# Patient Record
Sex: Female | Born: 1940
Health system: Southern US, Community
[De-identification: ages and names within clinical notes are randomized; demographics above are authoritative.]

## PROBLEM LIST (undated history)

## (undated) DIAGNOSIS — R0789 Other chest pain: Secondary | ICD-10-CM

## (undated) DIAGNOSIS — M1990 Unspecified osteoarthritis, unspecified site: Secondary | ICD-10-CM

## (undated) DIAGNOSIS — K635 Polyp of colon: Secondary | ICD-10-CM

## (undated) DIAGNOSIS — I209 Angina pectoris, unspecified: Secondary | ICD-10-CM

## (undated) DIAGNOSIS — E785 Hyperlipidemia, unspecified: Secondary | ICD-10-CM

## (undated) DIAGNOSIS — R0600 Dyspnea, unspecified: Secondary | ICD-10-CM

## (undated) DIAGNOSIS — F419 Anxiety disorder, unspecified: Secondary | ICD-10-CM

## (undated) DIAGNOSIS — K649 Unspecified hemorrhoids: Secondary | ICD-10-CM

## (undated) DIAGNOSIS — M5126 Other intervertebral disc displacement, lumbar region: Secondary | ICD-10-CM

## (undated) DIAGNOSIS — N841 Polyp of cervix uteri: Secondary | ICD-10-CM

## (undated) DIAGNOSIS — K2970 Gastritis, unspecified, without bleeding: Secondary | ICD-10-CM

## (undated) DIAGNOSIS — F329 Major depressive disorder, single episode, unspecified: Secondary | ICD-10-CM

## (undated) DIAGNOSIS — R1310 Dysphagia, unspecified: Secondary | ICD-10-CM

## (undated) DIAGNOSIS — I10 Essential (primary) hypertension: Secondary | ICD-10-CM

## (undated) DIAGNOSIS — F32A Depression, unspecified: Secondary | ICD-10-CM

## (undated) DIAGNOSIS — G4700 Insomnia, unspecified: Secondary | ICD-10-CM

## (undated) DIAGNOSIS — I2510 Atherosclerotic heart disease of native coronary artery without angina pectoris: Secondary | ICD-10-CM

## (undated) DIAGNOSIS — E538 Deficiency of other specified B group vitamins: Secondary | ICD-10-CM

## (undated) DIAGNOSIS — K219 Gastro-esophageal reflux disease without esophagitis: Secondary | ICD-10-CM

## (undated) DIAGNOSIS — K224 Dyskinesia of esophagus: Secondary | ICD-10-CM

## (undated) DIAGNOSIS — I639 Cerebral infarction, unspecified: Secondary | ICD-10-CM

## (undated) DIAGNOSIS — F0390 Unspecified dementia without behavioral disturbance: Secondary | ICD-10-CM

## (undated) DIAGNOSIS — D329 Benign neoplasm of meninges, unspecified: Secondary | ICD-10-CM

## (undated) HISTORY — PX: OTHER SURGICAL HISTORY: SHX169

## (undated) HISTORY — PX: TUBAL LIGATION: SHX77

## (undated) HISTORY — PX: COLONOSCOPY: SHX174

## (undated) HISTORY — PX: CHOLECYSTECTOMY: SHX55

## (undated) HISTORY — PX: BIOPSY SHOULDER: PRO31

---

## 2005-04-11 ENCOUNTER — Ambulatory Visit: Payer: Self-pay | Admitting: Internal Medicine

## 2005-10-05 ENCOUNTER — Emergency Department: Payer: Self-pay | Admitting: Emergency Medicine

## 2005-10-07 ENCOUNTER — Ambulatory Visit: Payer: Self-pay | Admitting: Emergency Medicine

## 2005-11-05 ENCOUNTER — Ambulatory Visit: Payer: Self-pay | Admitting: Surgery

## 2006-04-15 ENCOUNTER — Ambulatory Visit: Payer: Self-pay | Admitting: Internal Medicine

## 2007-01-30 ENCOUNTER — Ambulatory Visit: Payer: Self-pay | Admitting: Unknown Physician Specialty

## 2007-02-19 ENCOUNTER — Ambulatory Visit: Payer: Self-pay | Admitting: Cardiology

## 2007-04-17 ENCOUNTER — Ambulatory Visit: Payer: Self-pay | Admitting: Internal Medicine

## 2007-09-08 ENCOUNTER — Ambulatory Visit: Payer: Self-pay | Admitting: Internal Medicine

## 2008-04-19 ENCOUNTER — Ambulatory Visit: Payer: Self-pay | Admitting: Internal Medicine

## 2008-07-11 ENCOUNTER — Emergency Department: Payer: Self-pay | Admitting: Emergency Medicine

## 2009-03-18 ENCOUNTER — Emergency Department: Payer: Self-pay | Admitting: Emergency Medicine

## 2009-03-26 ENCOUNTER — Emergency Department: Payer: Self-pay | Admitting: Internal Medicine

## 2009-04-12 ENCOUNTER — Ambulatory Visit: Payer: Self-pay | Admitting: Internal Medicine

## 2009-04-26 ENCOUNTER — Ambulatory Visit: Payer: Self-pay | Admitting: Internal Medicine

## 2010-04-27 ENCOUNTER — Ambulatory Visit: Payer: Self-pay | Admitting: Internal Medicine

## 2011-04-05 ENCOUNTER — Ambulatory Visit: Payer: Self-pay | Admitting: Internal Medicine

## 2011-05-15 ENCOUNTER — Ambulatory Visit: Payer: Self-pay | Admitting: Internal Medicine

## 2011-10-29 ENCOUNTER — Encounter: Payer: Self-pay | Admitting: Unknown Physician Specialty

## 2011-11-23 ENCOUNTER — Encounter: Payer: Self-pay | Admitting: Unknown Physician Specialty

## 2012-05-15 ENCOUNTER — Ambulatory Visit: Payer: Self-pay | Admitting: Internal Medicine

## 2012-10-29 ENCOUNTER — Ambulatory Visit: Payer: Self-pay | Admitting: Internal Medicine

## 2012-11-07 ENCOUNTER — Ambulatory Visit: Payer: Self-pay | Admitting: Unknown Physician Specialty

## 2012-11-10 LAB — PATHOLOGY REPORT

## 2013-05-28 ENCOUNTER — Other Ambulatory Visit: Payer: Self-pay | Admitting: Ophthalmology

## 2013-06-03 ENCOUNTER — Ambulatory Visit: Payer: Self-pay | Admitting: Internal Medicine

## 2013-06-09 ENCOUNTER — Ambulatory Visit: Payer: Self-pay | Admitting: Internal Medicine

## 2014-06-16 ENCOUNTER — Ambulatory Visit: Payer: Self-pay | Admitting: Internal Medicine

## 2014-11-17 ENCOUNTER — Ambulatory Visit: Payer: Self-pay | Admitting: Internal Medicine

## 2015-02-15 ENCOUNTER — Other Ambulatory Visit: Payer: Self-pay | Admitting: Internal Medicine

## 2015-02-15 DIAGNOSIS — Z1231 Encounter for screening mammogram for malignant neoplasm of breast: Secondary | ICD-10-CM

## 2015-03-02 ENCOUNTER — Ambulatory Visit: Payer: Self-pay | Attending: Internal Medicine

## 2015-11-19 ENCOUNTER — Emergency Department: Payer: Medicare Other

## 2015-11-19 ENCOUNTER — Emergency Department
Admission: EM | Admit: 2015-11-19 | Discharge: 2015-11-19 | Disposition: A | Discharge status: Home / Self Care | Payer: Medicare Other | Attending: Emergency Medicine | Admitting: Emergency Medicine

## 2015-11-19 DIAGNOSIS — S3992XA Unspecified injury of lower back, initial encounter: Secondary | ICD-10-CM | POA: Diagnosis not present

## 2015-11-19 DIAGNOSIS — W19XXXA Unspecified fall, initial encounter: Secondary | ICD-10-CM

## 2015-11-19 DIAGNOSIS — Y9289 Other specified places as the place of occurrence of the external cause: Secondary | ICD-10-CM | POA: Insufficient documentation

## 2015-11-19 DIAGNOSIS — W1839XA Other fall on same level, initial encounter: Secondary | ICD-10-CM | POA: Diagnosis not present

## 2015-11-19 DIAGNOSIS — Y9389 Activity, other specified: Secondary | ICD-10-CM | POA: Insufficient documentation

## 2015-11-19 DIAGNOSIS — Y998 Other external cause status: Secondary | ICD-10-CM | POA: Insufficient documentation

## 2015-11-19 DIAGNOSIS — M7918 Myalgia, other site: Secondary | ICD-10-CM

## 2015-11-19 LAB — URINALYSIS COMPLETE WITH MICROSCOPIC (ARMC ONLY)
BILIRUBIN URINE: NEGATIVE
GLUCOSE, UA: NEGATIVE mg/dL
Ketones, ur: NEGATIVE mg/dL
Nitrite: NEGATIVE
PH: 5 (ref 5.0–8.0)
Protein, ur: NEGATIVE mg/dL
Renal Epithelial: <1
Specific Gravity, Urine: 1.005 (ref 1.005–1.030)
Trans Epithel, UA: <1

## 2015-11-19 MED ORDER — OXYCODONE-ACETAMINOPHEN 5-325 MG PO TABS: 1.0000 | ORAL_TABLET | Freq: Four times a day (QID) | ORAL | Status: DC | PRN

## 2015-11-19 MED ORDER — ONDANSETRON 4 MG PO TBDP
4.0000 mg | ORAL_TABLET | Freq: Once | ORAL | Status: AC
  Administered 2015-11-19: 4 mg via ORAL
  Filled 2015-11-19: qty 1

## 2015-11-19 MED ORDER — ONDANSETRON 4 MG PO TBDP: 4.0000 mg | ORAL_TABLET | Freq: Three times a day (TID) | ORAL | Status: DC | PRN

## 2015-11-19 MED ORDER — OXYCODONE-ACETAMINOPHEN 5-325 MG PO TABS
1.0000 | ORAL_TABLET | Freq: Once | ORAL | Status: AC
  Administered 2015-11-19: 1 via ORAL
  Filled 2015-11-19: qty 1

## 2015-11-19 MED ORDER — TRAMADOL HCL 50 MG PO TABS
100.0000 mg | ORAL_TABLET | Freq: Once | ORAL | Status: AC
Start: 2015-11-19 — End: 2015-11-19
  Administered 2015-11-19: 100 mg via ORAL
  Filled 2015-11-19: qty 2

## 2015-11-19 NOTE — ED Notes (Signed)
Pt verbalized understanding of discharge instructions. NAD at this time. 

## 2015-11-19 NOTE — Discharge Instructions (Signed)
Please take your pain medication as needed, as prescribed. Please follow-up with orthopedics if her pain is not improved in the next several days. Return to the emergency department for any worsening pain, trouble walking, or any other symptom personally concerning to yourself.   Musculoskeletal Pain Musculoskeletal pain is muscle and boney aches and pains. These pains can occur in any part of the body. Your caregiver may treat you without knowing the cause of the pain. They may treat you if blood or urine tests, X-rays, and other tests were normal.  CAUSES There is often not a definite cause or reason for these pains. These pains may be caused by a type of germ (virus). The discomfort may also come from overuse. Overuse includes working out too hard when your body is not fit. Boney aches also come from weather changes. Bone is sensitive to atmospheric pressure changes. HOME CARE INSTRUCTIONS   Ask when your test results will be ready. Make sure you get your test results.  Only take over-the-counter or prescription medicines for pain, discomfort, or fever as directed by your caregiver. If you were given medications for your condition, do not drive, operate machinery or power tools, or sign legal documents for 24 hours. Do not drink alcohol. Do not take sleeping pills or other medications that may interfere with treatment.  Continue all activities unless the activities cause more pain. When the pain lessens, slowly resume normal activities. Gradually increase the intensity and duration of the activities or exercise.  During periods of severe pain, bed rest may be helpful. Lay or sit in any position that is comfortable.  Putting ice on the injured area.  Put ice in a bag.  Place a towel between your skin and the bag.  Leave the ice on for 15 to 20 minutes, 3 to 4 times a day.  Follow up with your caregiver for continued problems and no reason can be found for the pain. If the pain becomes  worse or does not go away, it may be necessary to repeat tests or do additional testing. Your caregiver may need to look further for a possible cause. SEEK IMMEDIATE MEDICAL CARE IF:  You have pain that is getting worse and is not relieved by medications.  You develop chest pain that is associated with shortness or breath, sweating, feeling sick to your stomach (nauseous), or throw up (vomit).  Your pain becomes localized to the abdomen.  You develop any new symptoms that seem different or that concern you. MAKE SURE YOU:   Understand these instructions.  Will watch your condition.  Will get help right away if you are not doing well or get worse.   This information is not intended to replace advice given to you by your health care provider. Make sure you discuss any questions you have with your health care provider.   Document Released: 10/08/2005 Document Revised: 12/31/2011 Document Reviewed: 06/12/2013 Elsevier Interactive Patient Education [employer].

## 2015-11-19 NOTE — ED Notes (Signed)
Ems states pt fell "a few days ago". Pt states awoke several hours ago with coccyx pain. Pt moving all extremities.

## 2015-11-19 NOTE — ED Provider Notes (Signed)
[facility] Emergency Department Provider Note  Time seen: 7:27 AM  I have reviewed the triage vital signs and the nursing notes.   HISTORY  Chief Complaint Fall    HPI Mallory Peters is a 75 y.o. female with a past medical history of low back pain, depression, presents to the emergency department with low back pain. According to the patient she fell Thursday evening (36 hours ago). She states minimal pain at that time, she was doing well yesterday, however upon awakening this morning she states significant lower back and buttock pain. Hurts worse with any attempted ambulation. Patient states she tried stretching to see if it would feel better but it did not so she came to the emergency department for evaluation. States she tried to go to urgent care yesterday however she states she waited around for about an hour and then left without being seen. Describes her pain as mild to moderate when lying flat, but moderate to severe when attempting to move. Patient has been able to ambulate without difficulty per patient. Denies any weakness or numbness of either leg. Denies any dysuria. Denies any fever.     No past medical history on file.  There are no active problems to display for this patient.   No past surgical history on file.  No current outpatient prescriptions on file.  Allergies Review of patient's allergies indicates no known allergies.  No family history on file.  Social History Social History  Substance Use Topics  . Smoking status: Not on file  . Smokeless tobacco: Not on file  . Alcohol Use: Not on file    Review of Systems Constitutional: Negative for fever. Cardiovascular: Negative for chest pain. Respiratory: Negative for shortness of breath. Gastrointestinal: Negative for abdominal pain Neurological: Negative for headache 10-point ROS otherwise negative.  ____________________________________________   PHYSICAL  EXAM:  VITAL SIGNS: ED Triage Vitals  Enc Vitals Group     BP 11/19/15 0704 153/97 mmHg     Pulse Rate 11/19/15 0704 85     Resp --      Temp 11/19/15 0704 97.7 F (36.5 C)     Temp Source 11/19/15 0704 Oral     SpO2 11/19/15 0704 96 %     Weight 11/19/15 0704 130 lb (58.968 kg)     Height 11/19/15 0704 5\' 2"  (1.575 m)     Head Cir --      Peak Flow --      Pain Score 11/19/15 0705 10     Pain Loc --      Pain Edu? --      Excl. in [place]? --     Constitutional: Alert and oriented. Well appearing and in no distress. Eyes: Normal exam ENT   Head: Normocephalic and atraumatic.   Mouth/Throat: Mucous membranes are moist. Cardiovascular: Normal rate, regular rhythm. No murmur Respiratory: Normal respiratory effort without tachypnea nor retractions. Breath sounds are clear and equal bilaterally. No wheezes/rales/rhonchi. Gastrointestinal: Soft and nontender. No distention.   Musculoskeletal: Moderate right sided lumbar paraspinal tenderness to palpation. Pelvis is stable. Good range of motion in the hips however she does have mild lower back pain with range of motion in the hips. Neurovascularly intact distally. Neurologic:  Normal speech and language. No gross focal neurologic deficits  Skin:  Skin is warm, dry and intact.  Psychiatric: Mood and affect are normal. Speech and behavior are normal.  ____________________________________________    RADIOLOGY  X-rays are negative  ____________________________________________  INITIAL IMPRESSION / ASSESSMENT AND PLAN / ED COURSE  Pertinent labs & imaging results that were available during my care of the patient were reviewed by me and considered in my medical decision making (see chart for details).  Patient presents the emergency department after a fall 36 hours ago with lower back and pelvis pain. Patient was able to walk around all day yesterday without any issues. Today she states pain with ambulation but she is still  able to ambulate well. We will treat the patient's discomfort with Ultram, obtain x-rays of her lumbar spine and pelvis, as well as a urinalysis. Overall the patient appears very well.  X-rays are negative for acute fracture. Urinalysis normal. Patient is feeling better status post pain medication. She did need Zofran due to nausea. We'll discharge the patient home, she has oxycodone at home which she has not been using because of the nausea. We will also prescribe Zofran ODT for the patient. ____________________________________________   FINAL CLINICAL IMPRESSION(S) / ED DIAGNOSES  Fall Musculoskeletal pain   Harvest Dark, MD 11/19/15 [phone]

## 2015-12-05 ENCOUNTER — Other Ambulatory Visit: Payer: Self-pay | Admitting: Unknown Physician Specialty

## 2015-12-05 DIAGNOSIS — T17908A Unspecified foreign body in respiratory tract, part unspecified causing other injury, initial encounter: Secondary | ICD-10-CM

## 2015-12-05 DIAGNOSIS — R1310 Dysphagia, unspecified: Secondary | ICD-10-CM

## 2016-01-30 ENCOUNTER — Other Ambulatory Visit: Payer: Self-pay | Admitting: Neurology

## 2016-01-30 DIAGNOSIS — R29898 Other symptoms and signs involving the musculoskeletal system: Secondary | ICD-10-CM

## 2016-02-20 ENCOUNTER — Ambulatory Visit
Admission: RE | Admit: 2016-02-20 | Discharge: 2016-02-20 | Disposition: A | Discharge status: Home / Self Care | Payer: Medicare Other | Source: Ambulatory Visit | Attending: Neurology | Admitting: Neurology

## 2016-02-20 ENCOUNTER — Ambulatory Visit: Payer: Medicare Other

## 2016-02-20 DIAGNOSIS — G319 Degenerative disease of nervous system, unspecified: Secondary | ICD-10-CM | POA: Diagnosis not present

## 2016-02-20 DIAGNOSIS — R29898 Other symptoms and signs involving the musculoskeletal system: Secondary | ICD-10-CM | POA: Insufficient documentation

## 2016-02-20 DIAGNOSIS — R29810 Facial weakness: Secondary | ICD-10-CM | POA: Insufficient documentation

## 2016-02-20 DIAGNOSIS — I739 Peripheral vascular disease, unspecified: Secondary | ICD-10-CM | POA: Insufficient documentation

## 2016-02-20 DIAGNOSIS — M479 Spondylosis, unspecified: Secondary | ICD-10-CM | POA: Insufficient documentation

## 2016-02-20 DIAGNOSIS — D320 Benign neoplasm of cerebral meninges: Secondary | ICD-10-CM | POA: Diagnosis not present

## 2016-02-20 MED ORDER — GADOBENATE DIMEGLUMINE 529 MG/ML IV SOLN
15.0000 mL | Freq: Once | INTRAVENOUS | Status: AC | PRN
  Administered 2016-02-20: 12 mL via INTRAVENOUS

## 2016-05-17 ENCOUNTER — Other Ambulatory Visit: Payer: Self-pay | Admitting: Internal Medicine

## 2016-05-17 DIAGNOSIS — R1310 Dysphagia, unspecified: Secondary | ICD-10-CM

## 2016-05-25 ENCOUNTER — Ambulatory Visit
Admission: RE | Admit: 2016-05-25 | Discharge: 2016-05-25 | Disposition: A | Discharge status: Home / Self Care | Payer: Medicare Other | Source: Ambulatory Visit | Attending: Internal Medicine | Admitting: Internal Medicine

## 2016-05-25 DIAGNOSIS — R05 Cough: Secondary | ICD-10-CM | POA: Diagnosis not present

## 2016-05-25 DIAGNOSIS — R1310 Dysphagia, unspecified: Secondary | ICD-10-CM | POA: Insufficient documentation

## 2016-05-25 DIAGNOSIS — K228 Other specified diseases of esophagus: Secondary | ICD-10-CM | POA: Insufficient documentation

## 2016-05-25 DIAGNOSIS — K449 Diaphragmatic hernia without obstruction or gangrene: Secondary | ICD-10-CM | POA: Diagnosis not present

## 2016-07-11 ENCOUNTER — Encounter: Payer: Self-pay | Admitting: *Deleted

## 2016-07-12 ENCOUNTER — Encounter: Payer: Self-pay | Admitting: Anesthesiology

## 2016-07-12 ENCOUNTER — Ambulatory Visit: Payer: Medicare Other | Admitting: Anesthesiology

## 2016-07-12 ENCOUNTER — Encounter
Admission: RE | Disposition: A | Discharge status: Home / Self Care | Payer: Self-pay | Source: Ambulatory Visit | Attending: Unknown Physician Specialty

## 2016-07-12 ENCOUNTER — Ambulatory Visit
Admission: RE | Admit: 2016-07-12 | Discharge: 2016-07-12 | Disposition: A | Discharge status: Home / Self Care | Payer: Medicare Other | Source: Ambulatory Visit | Attending: Unknown Physician Specialty | Admitting: Unknown Physician Specialty

## 2016-07-12 DIAGNOSIS — Z79899 Other long term (current) drug therapy: Secondary | ICD-10-CM | POA: Diagnosis not present

## 2016-07-12 DIAGNOSIS — I2510 Atherosclerotic heart disease of native coronary artery without angina pectoris: Secondary | ICD-10-CM | POA: Diagnosis not present

## 2016-07-12 DIAGNOSIS — F329 Major depressive disorder, single episode, unspecified: Secondary | ICD-10-CM | POA: Diagnosis not present

## 2016-07-12 DIAGNOSIS — M1990 Unspecified osteoarthritis, unspecified site: Secondary | ICD-10-CM | POA: Diagnosis not present

## 2016-07-12 DIAGNOSIS — K228 Other specified diseases of esophagus: Secondary | ICD-10-CM | POA: Insufficient documentation

## 2016-07-12 DIAGNOSIS — R1310 Dysphagia, unspecified: Secondary | ICD-10-CM | POA: Diagnosis present

## 2016-07-12 DIAGNOSIS — K219 Gastro-esophageal reflux disease without esophagitis: Secondary | ICD-10-CM | POA: Diagnosis not present

## 2016-07-12 DIAGNOSIS — I10 Essential (primary) hypertension: Secondary | ICD-10-CM | POA: Insufficient documentation

## 2016-07-12 DIAGNOSIS — Z7982 Long term (current) use of aspirin: Secondary | ICD-10-CM | POA: Diagnosis not present

## 2016-07-12 DIAGNOSIS — F419 Anxiety disorder, unspecified: Secondary | ICD-10-CM | POA: Diagnosis not present

## 2016-07-12 DIAGNOSIS — Z7902 Long term (current) use of antithrombotics/antiplatelets: Secondary | ICD-10-CM | POA: Diagnosis not present

## 2016-07-12 DIAGNOSIS — Z8673 Personal history of transient ischemic attack (TIA), and cerebral infarction without residual deficits: Secondary | ICD-10-CM | POA: Insufficient documentation

## 2016-07-12 HISTORY — PX: SAVORY DILATION: SHX5439

## 2016-07-12 HISTORY — DX: Dysphagia, unspecified: R13.10

## 2016-07-12 HISTORY — DX: Depression, unspecified: F32.A

## 2016-07-12 HISTORY — DX: Benign neoplasm of meninges, unspecified: D32.9

## 2016-07-12 HISTORY — DX: Gastro-esophageal reflux disease without esophagitis: K21.9

## 2016-07-12 HISTORY — DX: Polyp of cervix uteri: N84.1

## 2016-07-12 HISTORY — DX: Other chest pain: R07.89

## 2016-07-12 HISTORY — DX: Essential (primary) hypertension: I10

## 2016-07-12 HISTORY — DX: Anxiety disorder, unspecified: F41.9

## 2016-07-12 HISTORY — DX: Major depressive disorder, single episode, unspecified: F32.9

## 2016-07-12 HISTORY — DX: Unspecified osteoarthritis, unspecified site: M19.90

## 2016-07-12 HISTORY — DX: Cerebral infarction, unspecified: I63.9

## 2016-07-12 HISTORY — DX: Hyperlipidemia, unspecified: E78.5

## 2016-07-12 HISTORY — DX: Insomnia, unspecified: G47.00

## 2016-07-12 HISTORY — DX: Atherosclerotic heart disease of native coronary artery without angina pectoris: I25.10

## 2016-07-12 HISTORY — PX: ESOPHAGOGASTRODUODENOSCOPY (EGD) WITH PROPOFOL: SHX5813

## 2016-07-12 SURGERY — ESOPHAGOGASTRODUODENOSCOPY (EGD) WITH PROPOFOL
Anesthesia: General

## 2016-07-12 MED ORDER — PROPOFOL 500 MG/50ML IV EMUL
INTRAVENOUS | Status: DC | PRN
  Administered 2016-07-12: 100 ug/kg/min via INTRAVENOUS

## 2016-07-12 MED ORDER — MIDAZOLAM HCL 2 MG/2ML IJ SOLN
INTRAMUSCULAR | Status: DC | PRN
  Administered 2016-07-12: 1 mg via INTRAVENOUS

## 2016-07-12 MED ORDER — FENTANYL CITRATE (PF) 100 MCG/2ML IJ SOLN
INTRAMUSCULAR | Status: DC | PRN
  Administered 2016-07-12: 50 ug via INTRAVENOUS

## 2016-07-12 MED ORDER — SODIUM CHLORIDE 0.9 % IV SOLN
INTRAVENOUS | Status: DC
  Administered 2016-07-12: 09:00:00 via INTRAVENOUS

## 2016-07-12 MED ORDER — SODIUM CHLORIDE 0.9 % IV SOLN: INTRAVENOUS | Status: DC

## 2016-07-12 MED ORDER — LIDOCAINE HCL (CARDIAC) 20 MG/ML IV SOLN
INTRAVENOUS | Status: DC | PRN
  Administered 2016-07-12: 30 mg via INTRAVENOUS

## 2016-07-12 MED ORDER — SODIUM CHLORIDE 0.9 % IV SOLN
INTRAVENOUS | Status: DC | PRN
  Administered 2016-07-12: 09:00:00 via INTRAVENOUS

## 2016-07-12 NOTE — Anesthesia Postprocedure Evaluation (Signed)
Anesthesia Post Note  Patient: Mallory Peters  Procedure(s) Performed: Procedure(s) (LRB): ESOPHAGOGASTRODUODENOSCOPY (EGD) WITH PROPOFOL (N/A) SAVORY DILATION (N/A)  Patient location during evaluation: PACU Anesthesia Type: General Level of consciousness: awake Pain management: pain level controlled Vital Signs Assessment: post-procedure vital signs reviewed and stable Respiratory status: spontaneous breathing Cardiovascular status: stable Anesthetic complications: no    Last Vitals:  Vitals:   07/12/16 0931 07/12/16 0938  BP: 92/77 (!) 162/89  Pulse:  90  Resp:  14  Temp:      Last Pain:  Vitals:   07/12/16 0926  TempSrc: Tympanic  PainSc:                  VAN STAVEREN,Chieko Neises

## 2016-07-12 NOTE — H&P (Signed)
Primary Care Physician:  Idelle Crouch, MD Primary Gastroenterologist:  Dr. Vira Agar  Pre-Procedure History & Physical: HPI:  Mallory Peters is a 75 y.o. female is here for an endoscopy.   Past Medical History:  Diagnosis Date  . Anxiety   . CVD (cardiovascular disease)   . Depression   . Dysphagia   . GERD (gastroesophageal reflux disease)   . Hypertension   . Insomnia   . Lipids blood increased   . Meningioma ([place])   . Non-cardiac chest pain   . Osteoarthritis   . Polyp of cervix   . Stroke (cerebrum) [facility])     Past Surgical History:  Procedure Laterality Date  . BIOPSY SHOULDER    . CHOLECYSTECTOMY    . colonic polyps    . TUBAL LIGATION      Prior to Admission medications   Medication Sig Start Date End Date Taking? Authorizing Provider  ALPRAZolam (XANAX) 0.25 MG tablet Take 0.25 mg by mouth every 6 (six) hours as needed for anxiety.   Yes Historical Provider, MD  aspirin EC 81 MG tablet Take 81 mg by mouth daily.   Yes Historical Provider, MD  atorvastatin (LIPITOR) 20 MG tablet Take 20 mg by mouth daily.   Yes Historical Provider, MD  Cholecalciferol (VITAMIN D3) 1000 units CAPS Take 1,000 Units by mouth daily.   Yes Historical Provider, MD  clopidogrel (PLAVIX) 75 MG tablet Take 75 mg by mouth daily.   Yes Historical Provider, MD  diphenhydrAMINE (BENADRYL) 25 MG tablet Take 25 mg by mouth 1 day or 1 dose.   Yes Historical Provider, MD  pseudoephedrine-acetaminophen (TYLENOL SINUS) 30-500 MG TABS tablet Take 1 tablet by mouth every 4 (four) hours as needed.   Yes Historical Provider, MD  sertraline (ZOLOFT) 100 MG tablet Take 100 mg by mouth daily.   Yes Historical Provider, MD  vitamin B-12 (CYANOCOBALAMIN) 1000 MCG tablet Take 1,000 mcg by mouth daily.   Yes Historical Provider, MD  ondansetron (ZOFRAN ODT) 4 MG disintegrating tablet Take 1 tablet (4 mg total) by mouth every 8 (eight) hours as needed for nausea or vomiting. 11/19/15   Harvest Dark,  MD  oxyCODONE-acetaminophen (ROXICET) 5-325 MG tablet Take 1 tablet by mouth every 6 (six) hours as needed. Patient not taking: Reported on 07/12/2016 11/19/15   Harvest Dark, MD  pantoprazole (PROTONIX) 40 MG tablet Take 40 mg by mouth daily.    Historical Provider, MD    Allergies as of 06/28/2016  . (No Known Allergies)    History reviewed. No pertinent family history.  Social History   Social History  . Marital status: Widowed    Spouse name: N/A  . Number of children: N/A  . Years of education: N/A   Occupational History  . Not on file.   Social History Main Topics  . Smoking status: Never Smoker  . Smokeless tobacco: Never Used  . Alcohol use No  . Drug use: No  . Sexual activity: Not on file   Other Topics Concern  . Not on file   Social History Narrative  . No narrative on file    Review of Systems: See HPI, otherwise negative ROS  Physical Exam: BP 108/61   Pulse 82   Temp 97.7 F (36.5 C) (Tympanic)   Resp 14   Ht 5\' 2"  (1.575 m)   Wt 60.3 kg (133 lb)   SpO2 97%   BMI 24.33 kg/m  General:   Alert,  pleasant and cooperative  in NAD Head:  Normocephalic and atraumatic. Neck:  Supple; no masses or thyromegaly. Lungs:  Clear throughout to auscultation.    Heart:  Regular rate and rhythm. Abdomen:  Soft, nontender and nondistended. Normal bowel sounds, without guarding, and without rebound.   Neurologic:  Alert and  oriented x4;  grossly normal neurologically.  Impression/Plan: Mallory Peters is here for an endoscopy to be performed for dysphagia  Risks, benefits, limitations, and alternatives regarding  endoscopy have been reviewed with the patient.  Questions have been answered.  All parties agreeable.   Gaylyn Cheers, MD  07/12/2016, 9:10 AM

## 2016-07-12 NOTE — Transfer of Care (Signed)
Immediate Anesthesia Transfer of Care Note  Patient: Mallory Peters  Procedure(s) Performed: Procedure(s): ESOPHAGOGASTRODUODENOSCOPY (EGD) WITH PROPOFOL (N/A) SAVORY DILATION (N/A)  Patient Location: PACU  Anesthesia Type:General  Level of Consciousness: awake and sedated  Airway & Oxygen Therapy: Patient Spontanous Breathing and Patient connected to nasal cannula oxygen  Post-op Assessment: Report given to RN and Post -op Vital signs reviewed and stable  Post vital signs: Reviewed and stable  Last Vitals:  Vitals:   07/12/16 0843  BP: 108/61  Pulse: 82  Resp: 14  Temp: 36.5 C    Last Pain:  Vitals:   07/12/16 0843  TempSrc: Tympanic  PainSc: 3          Complications: No apparent anesthesia complications

## 2016-07-12 NOTE — Op Note (Signed)
[facility] Patient Name: Mallory Peters Procedure Date: 07/12/2016 8:52 AM MRN: [id number] Account #: [id number] Date of Birth: July 20, 1941 Admit Type: Outpatient Age: 75 Room: [facility] ENDO ROOM 1 Gender: Female Note Status: Finalized Procedure:            Upper GI endoscopy Indications:          Dysphagia Providers:            Manya Silvas, MD Referring MD:         Leonie Douglas. Doy Hutching, MD (Referring MD) Medicines:            Propofol per Anesthesia Complications:        No immediate complications. Procedure:            Pre-Anesthesia Assessment:                       - After reviewing the risks and benefits, the patient                        was deemed in satisfactory condition to undergo the                        procedure.                       After obtaining informed consent, the endoscope was                        passed under direct vision. Throughout the procedure,                        the patient's blood pressure, pulse, and oxygen                        saturations were monitored continuously. The Endoscope                        was introduced through the mouth, and advanced to the                        second part of duodenum. The upper GI endoscopy was                        accomplished without difficulty. The patient tolerated                        the procedure well. Findings:      laryngeal swollen purple oval prominence likely a venous plexus causing       swelling, this extends into upper esophagus. See picture.      The entire examined stomach was normal.      The examined duodenum was normal. Impression:           - Normal stomach.                       - Normal examined duodenum.                       - No specimens collected. Recommendation:       - The findings and recommendations were discussed with  the patient's family. Refer to ENT, recommend CT of                         neck. Manya Silvas, MD 07/12/2016 9:34:38 AM This report has been signed electronically. Number of Addenda: 0 Note Initiated On: 07/12/2016 8:52 AM      [facility]

## 2016-07-12 NOTE — Anesthesia Preprocedure Evaluation (Signed)
Anesthesia Evaluation  Patient identified by MRN, date of birth, ID band Patient awake    Reviewed: Allergy & Precautions, NPO status , Patient's Chart, lab work & pertinent test results  Airway Mallampati: II       Dental  (+) Teeth Intact   Pulmonary neg pulmonary ROS,    breath sounds clear to auscultation       Cardiovascular (-) hypertension Rhythm:Regular     Neuro/Psych Anxiety Depression CVA    GI/Hepatic Neg liver ROS, GERD  Medicated,  Endo/Other  negative endocrine ROS  Renal/GU negative Renal ROS     Musculoskeletal   Abdominal Normal abdominal exam  (+)   Peds negative pediatric ROS (+)  Hematology negative hematology ROS (+)   Anesthesia Other Findings   Reproductive/Obstetrics                             Anesthesia Physical Anesthesia Plan  ASA: II  Anesthesia Plan: General   Post-op Pain Management:    Induction: Intravenous  Airway Management Planned: Natural Airway and Nasal Cannula  Additional Equipment:   Intra-op Plan:   Post-operative Plan:   Informed Consent: I have reviewed the patients History and Physical, chart, labs and discussed the procedure including the risks, benefits and alternatives for the proposed anesthesia with the patient or authorized representative who has indicated his/her understanding and acceptance.     Plan Discussed with: CRNA  Anesthesia Plan Comments:         Anesthesia Quick Evaluation

## 2016-07-12 NOTE — Anesthesia Procedure Notes (Signed)
Performed by: COOK-MARTIN, Nyaira Hodgens Pre-anesthesia Checklist: Patient identified, Emergency Drugs available, Suction available, Patient being monitored and Timeout performed Patient Re-evaluated:Patient Re-evaluated prior to inductionOxygen Delivery Method: Nasal cannula Preoxygenation: Pre-oxygenation with 100% oxygen Intubation Type: IV induction Airway Equipment and Method: Bite block Placement Confirmation: CO2 detector and positive ETCO2     

## 2016-07-13 ENCOUNTER — Encounter: Payer: Self-pay | Admitting: Unknown Physician Specialty

## 2016-07-16 ENCOUNTER — Other Ambulatory Visit: Payer: Self-pay | Admitting: Unknown Physician Specialty

## 2016-07-16 DIAGNOSIS — J383 Other diseases of vocal cords: Secondary | ICD-10-CM

## 2016-07-17 ENCOUNTER — Ambulatory Visit
Admission: RE | Admit: 2016-07-17 | Discharge: 2016-07-17 | Disposition: A | Discharge status: Home / Self Care | Payer: Medicare Other | Source: Ambulatory Visit | Attending: Unknown Physician Specialty | Admitting: Unknown Physician Specialty

## 2016-07-17 ENCOUNTER — Other Ambulatory Visit: Payer: Self-pay | Admitting: Unknown Physician Specialty

## 2016-07-17 DIAGNOSIS — I998 Other disorder of circulatory system: Secondary | ICD-10-CM | POA: Insufficient documentation

## 2016-07-17 DIAGNOSIS — J383 Other diseases of vocal cords: Secondary | ICD-10-CM

## 2016-07-17 LAB — POCT I-STAT CREATININE: CREATININE: 0.7 mg/dL (ref 0.44–1.00)

## 2016-07-17 MED ORDER — GADOBENATE DIMEGLUMINE 529 MG/ML IV SOLN
15.0000 mL | Freq: Once | INTRAVENOUS | Status: AC | PRN
  Administered 2016-07-17: 12 mL via INTRAVENOUS

## 2016-07-23 ENCOUNTER — Other Ambulatory Visit: Payer: Self-pay | Admitting: Unknown Physician Specialty

## 2016-07-23 DIAGNOSIS — R490 Dysphonia: Secondary | ICD-10-CM

## 2016-07-23 DIAGNOSIS — Q2739 Arteriovenous malformation, other site: Secondary | ICD-10-CM

## 2016-07-27 ENCOUNTER — Ambulatory Visit
Admission: RE | Admit: 2016-07-27 | Discharge: 2016-07-27 | Disposition: A | Discharge status: Home / Self Care | Payer: Medicare Other | Source: Ambulatory Visit | Attending: Unknown Physician Specialty | Admitting: Unknown Physician Specialty

## 2016-07-27 DIAGNOSIS — R490 Dysphonia: Secondary | ICD-10-CM

## 2016-07-27 MED ORDER — GADOBENATE DIMEGLUMINE 529 MG/ML IV SOLN
15.0000 mL | Freq: Once | INTRAVENOUS | Status: AC | PRN
  Administered 2016-07-27: 12 mL via INTRAVENOUS

## 2016-08-28 ENCOUNTER — Ambulatory Visit
Admission: RE | Admit: 2016-08-28 | Discharge: 2016-08-28 | Disposition: A | Discharge status: Home / Self Care | Payer: Medicare Other | Source: Ambulatory Visit | Attending: Unknown Physician Specialty | Admitting: Unknown Physician Specialty

## 2016-08-28 DIAGNOSIS — R490 Dysphonia: Secondary | ICD-10-CM | POA: Insufficient documentation

## 2016-08-28 DIAGNOSIS — Q2739 Arteriovenous malformation, other site: Secondary | ICD-10-CM | POA: Insufficient documentation

## 2016-09-20 ENCOUNTER — Other Ambulatory Visit: Payer: Self-pay | Admitting: Internal Medicine

## 2016-09-20 DIAGNOSIS — R29898 Other symptoms and signs involving the musculoskeletal system: Secondary | ICD-10-CM

## 2016-09-20 DIAGNOSIS — R296 Repeated falls: Secondary | ICD-10-CM

## 2016-09-27 ENCOUNTER — Ambulatory Visit
Admission: RE | Admit: 2016-09-27 | Discharge: 2016-09-27 | Disposition: A | Discharge status: Home / Self Care | Payer: Medicare Other | Source: Ambulatory Visit | Attending: Internal Medicine | Admitting: Internal Medicine

## 2016-09-27 DIAGNOSIS — R296 Repeated falls: Secondary | ICD-10-CM | POA: Insufficient documentation

## 2016-09-27 DIAGNOSIS — R29898 Other symptoms and signs involving the musculoskeletal system: Secondary | ICD-10-CM | POA: Insufficient documentation

## 2016-10-03 ENCOUNTER — Other Ambulatory Visit: Payer: Self-pay | Admitting: Internal Medicine

## 2016-10-03 DIAGNOSIS — R29898 Other symptoms and signs involving the musculoskeletal system: Secondary | ICD-10-CM

## 2016-10-03 DIAGNOSIS — R296 Repeated falls: Secondary | ICD-10-CM

## 2016-10-03 DIAGNOSIS — S0990XA Unspecified injury of head, initial encounter: Secondary | ICD-10-CM

## 2017-02-23 ENCOUNTER — Emergency Department
Admission: EM | Admit: 2017-02-23 | Discharge: 2017-02-23 | Disposition: A | Discharge status: Home / Self Care | Payer: Medicare Other | Attending: Emergency Medicine | Admitting: Emergency Medicine

## 2017-02-23 ENCOUNTER — Emergency Department: Payer: Medicare Other

## 2017-02-23 ENCOUNTER — Encounter: Payer: Self-pay | Admitting: Emergency Medicine

## 2017-02-23 DIAGNOSIS — Y929 Unspecified place or not applicable: Secondary | ICD-10-CM | POA: Insufficient documentation

## 2017-02-23 DIAGNOSIS — W19XXXA Unspecified fall, initial encounter: Secondary | ICD-10-CM

## 2017-02-23 DIAGNOSIS — W1809XA Striking against other object with subsequent fall, initial encounter: Secondary | ICD-10-CM | POA: Diagnosis not present

## 2017-02-23 DIAGNOSIS — I10 Essential (primary) hypertension: Secondary | ICD-10-CM | POA: Insufficient documentation

## 2017-02-23 DIAGNOSIS — Y999 Unspecified external cause status: Secondary | ICD-10-CM | POA: Diagnosis not present

## 2017-02-23 DIAGNOSIS — S0990XA Unspecified injury of head, initial encounter: Secondary | ICD-10-CM | POA: Diagnosis present

## 2017-02-23 DIAGNOSIS — S0181XA Laceration without foreign body of other part of head, initial encounter: Secondary | ICD-10-CM

## 2017-02-23 DIAGNOSIS — Y9301 Activity, walking, marching and hiking: Secondary | ICD-10-CM | POA: Diagnosis not present

## 2017-02-23 DIAGNOSIS — Z7982 Long term (current) use of aspirin: Secondary | ICD-10-CM | POA: Insufficient documentation

## 2017-02-23 DIAGNOSIS — S80212A Abrasion, left knee, initial encounter: Secondary | ICD-10-CM | POA: Insufficient documentation

## 2017-02-23 MED ORDER — LIDOCAINE-EPINEPHRINE-TETRACAINE (LET) SOLUTION
3.0000 mL | Freq: Once | NASAL | Status: AC
  Administered 2017-02-23: 3 mL via TOPICAL
  Filled 2017-02-23: qty 3

## 2017-02-23 MED ORDER — BACITRACIN ZINC 500 UNIT/GM EX OINT
TOPICAL_OINTMENT | Freq: Once | CUTANEOUS | Status: AC
  Administered 2017-02-23: 17:00:00 via TOPICAL
  Filled 2017-02-23: qty 0.9

## 2017-02-23 NOTE — ED Triage Notes (Signed)
Pt states she fell walking today on the concrete and hit the front of her head above her forehead, small laceration noted and bleeding under control.  Abrasion noted to left knee. Pt states she was supposed to use cane but was not using. Denies LOC. Pt is alert and oriented x4.  She is on Plavix.

## 2017-02-23 NOTE — ED Notes (Signed)
AAOx3.  Skin warm and dry.  NAD 

## 2017-02-23 NOTE — Discharge Instructions (Signed)
Your exam and head CT scan are normal following your fall. Take Tylenol as needed for pain relief. Apply a small amount of antibiotic ointment or petroleum jelly to the wound as needed. Apply cool compresses to reduce swelling. Follow-up with Dr. Doy Hutching in 3-5 days for suture removal.

## 2017-02-23 NOTE — ED Provider Notes (Signed)
[facility] Emergency Department Provider Note ____________________________________________  Time seen: 1511  I have reviewed the triage vital signs and the nursing notes.  HISTORY  Chief Complaint  Fall  HPI Mallory Peters is a 76 y.o. female Presents to the ED accompanied by her sister for evaluation of an injury sustained following a mechanical fall today. Patient ascribes she was walking along the concrete carrying a pan of water, when she tripped over the edge of the newly laid affect, and hit her forehead. She sustained a small laceration over the right side of the forehead. She also has an abrasion to her left knee. She denies any loss of consciousness, nausea, vomiting, dizziness. The patient has been alert and oriented since the accident. She is required to use a single point cane due to her history of a stroke some mild gait disturbance. She is on Plavix daily due to that history. No other injuries reported at this time.  Past Medical History:  Diagnosis Date  . Anxiety   . CVD (cardiovascular disease)   . Depression   . Dysphagia   . GERD (gastroesophageal reflux disease)   . Hypertension   . Insomnia   . Lipids blood increased   . Meningioma ([place])   . Non-cardiac chest pain   . Osteoarthritis   . Polyp of cervix   . Stroke (cerebrum) ([place])     There are no active problems to display for this patient.   Past Surgical History:  Procedure Laterality Date  . BIOPSY SHOULDER    . CHOLECYSTECTOMY    . colonic polyps    . ESOPHAGOGASTRODUODENOSCOPY (EGD) WITH PROPOFOL N/A 07/12/2016   Procedure: ESOPHAGOGASTRODUODENOSCOPY (EGD) WITH PROPOFOL;  Surgeon: Manya Silvas, MD;  Location: [facility] ENDOSCOPY;  Service: Endoscopy;  Laterality: N/A;  . SAVORY DILATION N/A 07/12/2016   Procedure: SAVORY DILATION;  Surgeon: Manya Silvas, MD;  Location: [facility] ENDOSCOPY;  Service: Endoscopy;  Laterality: N/A;  . TUBAL LIGATION      Prior to Admission  medications   Medication Sig Start Date End Date Taking? Authorizing Provider  ALPRAZolam (XANAX) 0.25 MG tablet Take 0.25 mg by mouth every 6 (six) hours as needed for anxiety.    [provider]  aspirin EC 81 MG tablet Take 81 mg by mouth daily.    [provider]  atorvastatin (LIPITOR) 20 MG tablet Take 20 mg by mouth daily.    [provider]  Cholecalciferol (VITAMIN D3) 1000 units CAPS Take 1,000 Units by mouth daily.    [provider]  clopidogrel (PLAVIX) 75 MG tablet Take 75 mg by mouth daily.    [provider]  diphenhydrAMINE (BENADRYL) 25 MG tablet Take 25 mg by mouth 1 day or 1 dose.    [provider]  ondansetron (ZOFRAN ODT) 4 MG disintegrating tablet Take 1 tablet (4 mg total) by mouth every 8 (eight) hours as needed for nausea or vomiting. 11/19/15   Harvest Dark, MD  oxyCODONE-acetaminophen (ROXICET) 5-325 MG tablet Take 1 tablet by mouth every 6 (six) hours as needed. Patient not taking: Reported on 07/12/2016 11/19/15   Harvest Dark, MD  pantoprazole (PROTONIX) 40 MG tablet Take 40 mg by mouth daily.    [provider]  pseudoephedrine-acetaminophen (TYLENOL SINUS) 30-500 MG TABS tablet Take 1 tablet by mouth every 4 (four) hours as needed.    [provider]  sertraline (ZOLOFT) 100 MG tablet Take 100 mg by mouth daily.    [provider]  vitamin B-12 (CYANOCOBALAMIN) 1000 MCG tablet Take 1,000 mcg by mouth daily.    [provider]    Allergies Norco [hydrocodone-acetaminophen]  History reviewed. No pertinent family history.  Social History Social History  Substance Use Topics  . Smoking status: Never Smoker  . Smokeless tobacco: Never Used  . Alcohol use No    Review of Systems  Constitutional: Negative for fever. Eyes: Negative for visual changes. ENT: Negative for sore throat. Cardiovascular: Negative for chest pain. Respiratory: Negative for shortness  of breath. Gastrointestinal: Negative for abdominal pain, vomiting and diarrhea. Musculoskeletal: Negative for back pain. Left knee abrasion as above. Skin: Negative for rash. Facial contusion as above Neurological: Negative for focal weakness or numbness. ____________________________________________  PHYSICAL EXAM:  VITAL SIGNS: ED Triage Vitals  Enc Vitals Group     BP 02/23/17 1341 120/60     Pulse Rate 02/23/17 1341 98     Resp 02/23/17 1341 20     Temp 02/23/17 1341 98.4 F (36.9 C)     Temp Source 02/23/17 1341 Oral     SpO2 02/23/17 1341 97 %     Weight 02/23/17 1344 135 lb (61.2 kg)     Height 02/23/17 1344 5\' 2"  (1.575 m)     Head Circumference --      Peak Flow --      Pain Score 02/23/17 1340 5     Pain Loc --      Pain Edu? --      Excl. in [place]? --     Constitutional: Alert and oriented. Well appearing and in no distress. Head: Normocephalic and atraumatic, except for a 2 cm linear laceration over the right brow/temple. No active bleeding.  Eyes: Conjunctivae are normal. PERRL. Normal extraocular movements Ears: Canals clear. TMs intact bilaterally. Nose: No congestion/rhinorrhea/epistaxis. Mouth/Throat: Mucous membranes are moist. Neck: Supple. No thyromegaly. Hematological/Lymphatic/Immunological: No cervical lymphadenopathy. Cardiovascular: Normal rate, regular rhythm. Normal distal pulses. Respiratory: Normal respiratory effort. No wheezes/rales/rhonchi. Musculoskeletal: Nontender with normal range of motion in all extremities. Left knee with a superficial abrasion noted laterally. Normal knee exam without indication of internal derangement. Neurologic:  Normal gait without ataxia. Normal speech and language. No gross focal neurologic deficits are appreciated. Skin:  Skin is warm, dry and intact. No rash noted.  Psychiatric: Mood and affect are normal. Patient exhibits appropriate insight and judgment. ____________________________________________    RADIOLOGY  CT Head w/o CM  IMPRESSION: 1. Mild right scalp soft tissue injury without underlying fracture. 2. No acute traumatic injury to the brain. Stable small calcified left frontal convexity meningioma. ____________________________________________  PROCEDURES  LACERATION REPAIR Performed by: Kyra Leyland, PA-S Tyler Deis) Authorized by: Melvenia Needles Consent: Verbal consent obtained. Risks and benefits: risks, benefits and alternatives were discussed Consent given by: patient Patient identity confirmed: provided demographic data Prepped and Draped in normal sterile fashion Wound explored  Laceration Location: right   Laceration Length: 2 cm  No Foreign Bodies seen or palpated  Anesthesia: local infiltration  Local anesthetic: lidocaine-epinephrine-tetracaine  Anesthetic total: 1.5 ml  Irrigation method: syringe Amount of cleaning: standard  Skin closure: 5-0 nylon  Number of sutures: 4  Technique: interrupted.   Patient tolerance: Patient tolerated the procedure well with no immediate complications. ____________________________________________  INITIAL IMPRESSION / ASSESSMENT AND PLAN / ED COURSE  Patient with injury sustained following a mechanical fall just prior to arrival. She is treated with a laceration repair using sutures. She is otherwise discharged with wound care  instructions and will follow with the primary care provider in 3-5 days for suture removal. Return precautions are reviewed. ____________________________________________  FINAL CLINICAL IMPRESSION(S) / ED DIAGNOSES  Final diagnoses:  Fall, initial encounter  Minor head injury, initial encounter  Facial laceration, initial encounter     Melvenia Needles, PA-C 02/23/17 1808    Orbie Pyo, MD 02/24/17 [phone]

## 2017-02-23 NOTE — ED Notes (Signed)
CT noted with no acute intracranial abnormality. Moved to flex wait.

## 2017-03-11 ENCOUNTER — Encounter: Payer: Self-pay | Admitting: Emergency Medicine

## 2017-03-11 ENCOUNTER — Emergency Department
Admission: EM | Admit: 2017-03-11 | Discharge: 2017-03-12 | Disposition: A | Discharge status: Other Acute Inpatient Hospital | Payer: Medicare Other | Attending: Internal Medicine | Admitting: Internal Medicine

## 2017-03-11 DIAGNOSIS — F0280 Dementia in other diseases classified elsewhere without behavioral disturbance: Secondary | ICD-10-CM

## 2017-03-11 DIAGNOSIS — I10 Essential (primary) hypertension: Secondary | ICD-10-CM | POA: Diagnosis not present

## 2017-03-11 DIAGNOSIS — Z7982 Long term (current) use of aspirin: Secondary | ICD-10-CM | POA: Insufficient documentation

## 2017-03-11 DIAGNOSIS — N390 Urinary tract infection, site not specified: Secondary | ICD-10-CM

## 2017-03-11 DIAGNOSIS — R45851 Suicidal ideations: Secondary | ICD-10-CM | POA: Diagnosis not present

## 2017-03-11 DIAGNOSIS — F329 Major depressive disorder, single episode, unspecified: Secondary | ICD-10-CM | POA: Diagnosis not present

## 2017-03-11 DIAGNOSIS — Z79899 Other long term (current) drug therapy: Secondary | ICD-10-CM | POA: Insufficient documentation

## 2017-03-11 DIAGNOSIS — G309 Alzheimer's disease, unspecified: Secondary | ICD-10-CM

## 2017-03-11 DIAGNOSIS — F32A Depression, unspecified: Secondary | ICD-10-CM

## 2017-03-11 DIAGNOSIS — F322 Major depressive disorder, single episode, severe without psychotic features: Secondary | ICD-10-CM

## 2017-03-11 LAB — COMPREHENSIVE METABOLIC PANEL
ALK PHOS: 65 U/L (ref 38–126)
ALT: 13 U/L — AB (ref 14–54)
AST: 21 U/L (ref 15–41)
Albumin: 4.4 g/dL (ref 3.5–5.0)
Anion gap: 5 (ref 5–15)
BILIRUBIN TOTAL: 0.9 mg/dL (ref 0.3–1.2)
BUN: 12 mg/dL (ref 6–20)
CALCIUM: 10 mg/dL (ref 8.9–10.3)
CHLORIDE: 108 mmol/L (ref 101–111)
CO2: 28 mmol/L (ref 22–32)
CREATININE: 0.79 mg/dL (ref 0.44–1.00)
Glucose, Bld: 92 mg/dL (ref 65–99)
Potassium: 4.6 mmol/L (ref 3.5–5.1)
Sodium: 141 mmol/L (ref 135–145)
TOTAL PROTEIN: 7.2 g/dL (ref 6.5–8.1)

## 2017-03-11 LAB — SALICYLATE LEVEL

## 2017-03-11 LAB — URINALYSIS, COMPLETE (UACMP) WITH MICROSCOPIC
BACTERIA UA: NONE SEEN
Bilirubin Urine: NEGATIVE
GLUCOSE, UA: NEGATIVE mg/dL
KETONES UR: NEGATIVE mg/dL
NITRITE: NEGATIVE
PH: 6 (ref 5.0–8.0)
PROTEIN: NEGATIVE mg/dL
Specific Gravity, Urine: 1.009 (ref 1.005–1.030)

## 2017-03-11 LAB — CBC
HCT: 40.8 % (ref 35.0–47.0)
Hemoglobin: 13.9 g/dL (ref 12.0–16.0)
MCH: 27.9 pg (ref 26.0–34.0)
MCHC: 34 g/dL (ref 32.0–36.0)
MCV: 82.1 fL (ref 80.0–100.0)
PLATELETS: 219 10*3/uL (ref 150–440)
RBC: 4.97 MIL/uL (ref 3.80–5.20)
RDW: 14.7 % — ABNORMAL HIGH (ref 11.5–14.5)
WBC: 5.6 10*3/uL (ref 3.6–11.0)

## 2017-03-11 LAB — URINE DRUG SCREEN, QUALITATIVE (ARMC ONLY)
Amphetamines, Ur Screen: NOT DETECTED
BARBITURATES, UR SCREEN: NOT DETECTED
Benzodiazepine, Ur Scrn: NOT DETECTED
CANNABINOID 50 NG, UR ~~LOC~~: NOT DETECTED
Cocaine Metabolite,Ur ~~LOC~~: NOT DETECTED
MDMA (ECSTASY) UR SCREEN: NOT DETECTED
Methadone Scn, Ur: NOT DETECTED
Opiate, Ur Screen: NOT DETECTED
PHENCYCLIDINE (PCP) UR S: NOT DETECTED
TRICYCLIC, UR SCREEN: NOT DETECTED

## 2017-03-11 LAB — ACETAMINOPHEN LEVEL: Acetaminophen (Tylenol), Serum: <10 ug/mL — ABNORMAL LOW (ref 10–30)

## 2017-03-11 LAB — ETHANOL

## 2017-03-11 LAB — TSH: TSH: 2.534 u[IU]/mL (ref 0.350–4.500)

## 2017-03-11 MED ORDER — ACETAMINOPHEN 325 MG PO TABS
650.0000 mg | ORAL_TABLET | Freq: Once | ORAL | Status: AC
  Administered 2017-03-11: 650 mg via ORAL
  Filled 2017-03-11: qty 2

## 2017-03-11 MED ORDER — CLOPIDOGREL BISULFATE 75 MG PO TABS
75.0000 mg | ORAL_TABLET | Freq: Every day | ORAL | Status: DC
  Administered 2017-03-11 – 2017-03-12 (×2): 75 mg via ORAL
  Filled 2017-03-11 (×2): qty 1

## 2017-03-11 MED ORDER — ASPIRIN EC 81 MG PO TBEC
81.0000 mg | DELAYED_RELEASE_TABLET | Freq: Every day | ORAL | Status: DC
  Administered 2017-03-11 – 2017-03-12 (×2): 81 mg via ORAL
  Filled 2017-03-11 (×2): qty 1

## 2017-03-11 MED ORDER — PANTOPRAZOLE SODIUM 40 MG PO TBEC
40.0000 mg | DELAYED_RELEASE_TABLET | Freq: Every day | ORAL | Status: DC
  Administered 2017-03-11 – 2017-03-12 (×2): 40 mg via ORAL
  Filled 2017-03-11 (×2): qty 1

## 2017-03-11 MED ORDER — VITAMIN B-12 1000 MCG PO TABS
1000.0000 ug | ORAL_TABLET | Freq: Every day | ORAL | Status: DC
  Administered 2017-03-11 – 2017-03-12 (×2): 1000 ug via ORAL
  Filled 2017-03-11 (×2): qty 1

## 2017-03-11 MED ORDER — SERTRALINE HCL 50 MG PO TABS
100.0000 mg | ORAL_TABLET | Freq: Every day | ORAL | Status: DC
  Administered 2017-03-11 – 2017-03-12 (×2): 100 mg via ORAL
  Filled 2017-03-11 (×2): qty 2

## 2017-03-11 MED ORDER — ATORVASTATIN CALCIUM 20 MG PO TABS: 20.0000 mg | ORAL_TABLET | Freq: Every day | ORAL | Status: DC

## 2017-03-11 NOTE — ED Provider Notes (Signed)
[facility] Emergency Department Provider Note       Time seen: ----------------------------------------- 10:40 AM on 03/11/2017 -----------------------------------------     I have reviewed the triage vital signs and the nursing notes.   HISTORY   Chief Complaint Depression    HPI Mallory Peters is a 76 y.o. female who presents to the ED for increasing depression over the past 2 months. Patient states she is having thoughts of harming herself with plan of throwing herself down the stairs. Patient reports feeling hopeless and has had intermittent insomnia. She denies any recent illness or other complaints.   Past Medical History:  Diagnosis Date  . Anxiety   . CVD (cardiovascular disease)   . Depression   . Dysphagia   . GERD (gastroesophageal reflux disease)   . Hypertension   . Insomnia   . Lipids blood increased   . Meningioma ([place])   . Non-cardiac chest pain   . Osteoarthritis   . Polyp of cervix   . Stroke (cerebrum) ([place])     There are no active problems to display for this patient.   Past Surgical History:  Procedure Laterality Date  . BIOPSY SHOULDER    . CHOLECYSTECTOMY    . colonic polyps    . ESOPHAGOGASTRODUODENOSCOPY (EGD) WITH PROPOFOL N/A 07/12/2016   Procedure: ESOPHAGOGASTRODUODENOSCOPY (EGD) WITH PROPOFOL;  Surgeon: Manya Silvas, MD;  Location: [facility] ENDOSCOPY;  Service: Endoscopy;  Laterality: N/A;  . SAVORY DILATION N/A 07/12/2016   Procedure: SAVORY DILATION;  Surgeon: Manya Silvas, MD;  Location: [facility] ENDOSCOPY;  Service: Endoscopy;  Laterality: N/A;  . TUBAL LIGATION      Allergies Norco [hydrocodone-acetaminophen]  Social History Social History  Substance Use Topics  . Smoking status: Never Smoker  . Smokeless tobacco: Never Used  . Alcohol use No    Review of Systems Constitutional: Negative for fever. Eyes: Negative for vision changes ENT:  Negative for congestion, sore  throat Cardiovascular: Negative for chest pain. Respiratory: Negative for shortness of breath. Gastrointestinal: Negative for abdominal pain, vomiting and diarrhea. Genitourinary: Negative for dysuria. Musculoskeletal: Negative for back pain. Skin: Negative for rash. Neurological: Negative for headaches, focal weakness or numbness. Psychiatric: Positive for hopelessness, depression, suicidal thought  All systems negative/normal/unremarkable except as stated in the HPI  ____________________________________________   PHYSICAL EXAM:  VITAL SIGNS: ED Triage Vitals [03/11/17 0947]  Enc Vitals Group     BP 128/78     Pulse Rate 85     Resp 18     Temp 98.5 F (36.9 C)     Temp Source Oral     SpO2 96 %     Weight 130 lb (59 kg)     Height 5\' 2"  (1.575 m)     Head Circumference      Peak Flow      Pain Score      Pain Loc      Pain Edu?      Excl. in [place]?     Constitutional: Alert and oriented. Well appearing and in no distress. Eyes: Conjunctivae are normal. PERRL. Normal extraocular movements. ENT   Head: Normocephalic with remote head trauma   Nose: No congestion/rhinnorhea.   Mouth/Throat: Mucous membranes are moist.   Neck: No stridor. Cardiovascular: Normal rate, regular rhythm. No murmurs, rubs, or gallops. Respiratory: Normal respiratory effort without tachypnea nor retractions. Breath sounds are clear and equal bilaterally. No wheezes/rales/rhonchi. Gastrointestinal: Soft and nontender. Normal bowel sounds Musculoskeletal: Nontender with normal range  of motion in extremities. No lower extremity tenderness nor edema. Neurologic:  Normal speech and language. No gross focal neurologic deficits are appreciated. Strength, sensation, cranial nerves appear to be intact, mild gait ataxia is noted Skin:  Skin is warm, dry and intact. Right infraorbital ecchymosis is noted, recent laceration noted superior lateral to the right eyebrow Psychiatric: Depressed mood  and affect, patient tearful ____________________________________________  ED COURSE:  Pertinent labs & imaging results that were available during my care of the patient were reviewed by me and considered in my medical decision making (see chart for details). Patient presents for depression with suicidal thought, we will assess with labs and imaging as indicated.   Procedures ____________________________________________   LABS (pertinent positives/negatives)  Labs Reviewed  COMPREHENSIVE METABOLIC PANEL - Abnormal; Notable for the following:       Result Value   ALT 13 (*)    All other components within normal limits  ACETAMINOPHEN LEVEL - Abnormal; Notable for the following:    Acetaminophen (Tylenol), Serum <10 (*)    All other components within normal limits  CBC - Abnormal; Notable for the following:    RDW 14.7 (*)    All other components within normal limits  ETHANOL  SALICYLATE LEVEL  URINE DRUG SCREEN, QUALITATIVE ([facility] ONLY)  URINALYSIS, COMPLETE ([facility]) WITH MICROSCOPIC  TSH   ____________________________________________  FINAL ASSESSMENT AND PLAN  Depression, suicidal ideation  Plan: Patient's labs were dictated above. Patient had presented for Depression and suicidal thought. She is medically stable for psychiatric evaluation and disposition.   Earleen Newport, MD   Note: This note was generated in part or whole with voice recognition software. Voice recognition is usually quite accurate but there are transcription errors that can and very often do occur. I apologize for any typographical errors that were not detected and corrected.     Earleen Newport, MD 03/11/17 1212

## 2017-03-11 NOTE — ED Notes (Signed)
Nurse Amy T. In room with patient at this time

## 2017-03-11 NOTE — ED Notes (Signed)
BEHAVIORAL HEALTH ROUNDING Patient sleeping: No. Patient alert and oriented: yes Behavior appropriate: Yes.  ; If no, describe:  Nutrition and fluids offered: yes Toileting and hygiene offered: Yes  Sitter present: q15 minute observations and security  monitoring Law enforcement present: Yes  ODS  

## 2017-03-11 NOTE — ED Provider Notes (Signed)
-----------------------------------------   5:58 PM on 03/11/2017 -----------------------------------------  The patient has been seen and evaluated by psychiatry, she has had a largely negative workup. They believe the patient would benefit from geriatric/psychiatric placement and will refer the patient to appropriate facilities.   Harvest Dark, MD 03/11/17 1758

## 2017-03-11 NOTE — ED Notes (Signed)
Pt with an observed visit from her sister

## 2017-03-11 NOTE — Consult Note (Signed)
[facility] Consult   Reason for Consult:  Consult for 76 year old woman presented to the hospital with suicidal thoughts and depression Referring Physician:  Jimmye Norman Patient Identification: Mallory Peters MRN:  [id number] Principal Diagnosis: Severe major depression, single episode, without psychotic features [facility]) Diagnosis:   Patient Active Problem List   Diagnosis Date Noted  . Severe major depression, single episode, without psychotic features ([place]) [F32.2] 03/11/2017  . Dementia of the Alzheimer's type [G30.9, F02.80] 03/11/2017    Total Time spent with patient: 1 hour  Subjective:   Mallory Peters is a 76 y.o. female patient admitted with "I've been thinking of throwing myself down the stairs".  HPI:  Patient interviewed chart reviewed. 76 year old woman brought to the emergency room on the referral of her primary care doctor. Patient went to see Dr. Doy Hutching today and revealed to him that she has been having thoughts of throwing herself down the stairs in her house because she is so depressed. Patient reports she is been feeling sad and depressed for months now. Mood stays down and negative. She thinks of herself as being a burden to others. She is not sleeping well at night and has started having visual hallucinations at night. She knows they are not real but they bother her nonetheless. She admits that her appetite has been decreased and it sounds like her family has been fussing with her to try and eat normally. She has had 2 accidental falls at home over the last couple months was recently about a week ago. She feels very ashamed about these. No alcohol abuse. No substance abuse. She is prescribed Zoloft by her primary care doctor and has been compliant.  Medical history: Patient has a history of dyslipidemia past history of a stroke possibly.  Substance abuse history: No alcohol or drug abuse past or present  Social history: Patient lives by herself in  a large house where she raised her family. Has been a widow for many years. Has 3 adult children one of whom lives locally. Has several younger siblings who stay in contact with her. No longer drives since realizing a couple years ago how confused she was.  Past Psychiatric History: Patient has been treated with Zoloft for years. She believes that the medicine was started around the time of her stroke. She does not recall it as being for depression so much as anxiety. No history of psychiatric hospitalization or suicide attempt or self injury in the past  Risk to Self: Is patient at risk for suicide?: Yes Risk to Others:   Prior Inpatient Therapy:   Prior Outpatient Therapy:    Past Medical History:  Past Medical History:  Diagnosis Date  . Anxiety   . CVD (cardiovascular disease)   . Depression   . Dysphagia   . GERD (gastroesophageal reflux disease)   . Hypertension   . Insomnia   . Lipids blood increased   . Meningioma ([place])   . Non-cardiac chest pain   . Osteoarthritis   . Polyp of cervix   . Stroke (cerebrum) [facility])     Past Surgical History:  Procedure Laterality Date  . BIOPSY SHOULDER    . CHOLECYSTECTOMY    . colonic polyps    . ESOPHAGOGASTRODUODENOSCOPY (EGD) WITH PROPOFOL N/A 07/12/2016   Procedure: ESOPHAGOGASTRODUODENOSCOPY (EGD) WITH PROPOFOL;  Surgeon: Manya Silvas, MD;  Location: [facility] ENDOSCOPY;  Service: Endoscopy;  Laterality: N/A;  . SAVORY DILATION N/A 07/12/2016   Procedure: SAVORY DILATION;  Surgeon: Gavin Pound  Vira Agar, MD;  Location: [facility] ENDOSCOPY;  Service: Endoscopy;  Laterality: N/A;  . TUBAL LIGATION     Family History: No family history on file. Family Psychiatric  History: Positive for anxiety and depression no history of suicide Social History:  History  Alcohol Use No     History  Drug Use No    Social History   Social History  . Marital status: Widowed    Spouse name: N/A  . Number of children: N/A  . Years of education: N/A    Social History Main Topics  . Smoking status: Never Smoker  . Smokeless tobacco: Never Used  . Alcohol use No  . Drug use: No  . Sexual activity: Not Asked   Other Topics Concern  . None   Social History Narrative  . None   Additional Social History:    Allergies:   Allergies  Allergen Reactions  . Norco [Hydrocodone-Acetaminophen]     Drowsy and confused    Labs:  Results for orders placed or performed during the hospital encounter of 03/11/17 (from the past 48 hour(s))  Comprehensive metabolic panel     Status: Abnormal   Collection Time: 03/11/17  9:49 AM  Result Value Ref Range   Sodium 141 135 - 145 mmol/L   Potassium 4.6 3.5 - 5.1 mmol/L   Chloride 108 101 - 111 mmol/L   CO2 28 22 - 32 mmol/L   Glucose, Bld 92 65 - 99 mg/dL   BUN 12 6 - 20 mg/dL   Creatinine, Ser 0.79 0.44 - 1.00 mg/dL   Calcium 10.0 8.9 - 10.3 mg/dL   Total Protein 7.2 6.5 - 8.1 g/dL   Albumin 4.4 3.5 - 5.0 g/dL   AST 21 15 - 41 U/L   ALT 13 (L) 14 - 54 U/L   Alkaline Phosphatase 65 38 - 126 U/L   Total Bilirubin 0.9 0.3 - 1.2 mg/dL   GFR calc non Af Amer >60 >60 mL/min   GFR calc Af Amer >60 >60 mL/min    Comment: (NOTE) The eGFR has been calculated using the CKD EPI equation. This calculation has not been validated in all clinical situations. eGFR's persistently <60 mL/min signify possible Chronic Kidney Disease.    Anion gap 5 5 - 15  Ethanol     Status: None   Collection Time: 03/11/17  9:49 AM  Result Value Ref Range   Alcohol, Ethyl (B) <5 <5 mg/dL    Comment:        LOWEST DETECTABLE LIMIT FOR SERUM ALCOHOL IS 5 mg/dL FOR MEDICAL PURPOSES ONLY   Salicylate level     Status: None   Collection Time: 03/11/17  9:49 AM  Result Value Ref Range   Salicylate Lvl <1.6 2.8 - 30.0 mg/dL  Acetaminophen level     Status: Abnormal   Collection Time: 03/11/17  9:49 AM  Result Value Ref Range   Acetaminophen (Tylenol), Serum <10 (L) 10 - 30 ug/mL    Comment:        THERAPEUTIC  CONCENTRATIONS VARY SIGNIFICANTLY. A RANGE OF 10-30 ug/mL MAY BE AN EFFECTIVE CONCENTRATION FOR MANY PATIENTS. HOWEVER, SOME ARE BEST TREATED AT CONCENTRATIONS OUTSIDE THIS RANGE. ACETAMINOPHEN CONCENTRATIONS >150 ug/mL AT 4 HOURS AFTER INGESTION AND >50 ug/mL AT 12 HOURS AFTER INGESTION ARE OFTEN ASSOCIATED WITH TOXIC REACTIONS.   cbc     Status: Abnormal   Collection Time: 03/11/17  9:49 AM  Result Value Ref Range   WBC 5.6 3.6 - 11.0  K/uL   RBC 4.97 3.80 - 5.20 MIL/uL   Hemoglobin 13.9 12.0 - 16.0 g/dL   HCT 40.8 35.0 - 47.0 %   MCV 82.1 80.0 - 100.0 fL   MCH 27.9 26.0 - 34.0 pg   MCHC 34.0 32.0 - 36.0 g/dL   RDW 14.7 (H) 11.5 - 14.5 %   Platelets 219 150 - 440 K/uL  TSH     Status: None   Collection Time: 03/11/17  9:49 AM  Result Value Ref Range   TSH 2.534 0.350 - 4.500 uIU/mL    Comment: Performed by a 3rd Generation assay with a functional sensitivity of <=0.01 uIU/mL.  Urine Drug Screen, Qualitative     Status: None   Collection Time: 03/11/17 12:03 PM  Result Value Ref Range   Tricyclic, Ur Screen NONE DETECTED NONE DETECTED   Amphetamines, Ur Screen NONE DETECTED NONE DETECTED   MDMA (Ecstasy)Ur Screen NONE DETECTED NONE DETECTED   Cocaine Metabolite,Ur [place] NONE DETECTED NONE DETECTED   Opiate, Ur Screen NONE DETECTED NONE DETECTED   Phencyclidine (PCP) Ur S NONE DETECTED NONE DETECTED   Cannabinoid 50 Ng, Ur [place] NONE DETECTED NONE DETECTED   Barbiturates, Ur Screen NONE DETECTED NONE DETECTED   Benzodiazepine, Ur Scrn NONE DETECTED NONE DETECTED   Methadone Scn, Ur NONE DETECTED NONE DETECTED    Comment: (NOTE) 347  Tricyclics, urine               Cutoff 1000 ng/mL 200  Amphetamines, urine             Cutoff 1000 ng/mL 300  MDMA (Ecstasy), urine           Cutoff 500 ng/mL 400  Cocaine Metabolite, urine       Cutoff 300 ng/mL 500  Opiate, urine                   Cutoff 300 ng/mL 600  Phencyclidine (PCP), urine      Cutoff 25 ng/mL 700  Cannabinoid, urine               Cutoff 50 ng/mL 800  Barbiturates, urine             Cutoff 200 ng/mL 900  Benzodiazepine, urine           Cutoff 200 ng/mL 1000 Methadone, urine                Cutoff 300 ng/mL 1100 1200 The urine drug screen provides only a preliminary, unconfirmed 1300 analytical test result and should not be used for non-medical 1400 purposes. Clinical consideration and professional judgment should 1500 be applied to any positive drug screen result due to possible 1600 interfering substances. A more specific alternate chemical method 1700 must be used in order to obtain a confirmed analytical result.  1800 Gas chromato graphy / mass spectrometry (GC/MS) is the preferred 1900 confirmatory method.   Urinalysis, Complete w Microscopic     Status: Abnormal   Collection Time: 03/11/17 12:03 PM  Result Value Ref Range   Color, Urine YELLOW (A) YELLOW   APPearance CLEAR (A) CLEAR   Specific Gravity, Urine 1.009 1.005 - 1.030   pH 6.0 5.0 - 8.0   Glucose, UA NEGATIVE NEGATIVE mg/dL   Hgb urine dipstick SMALL (A) NEGATIVE   Bilirubin Urine NEGATIVE NEGATIVE   Ketones, ur NEGATIVE NEGATIVE mg/dL   Protein, ur NEGATIVE NEGATIVE mg/dL   Nitrite NEGATIVE NEGATIVE   Leukocytes, UA SMALL (A) NEGATIVE  RBC / HPF 0-5 0 - 5 RBC/hpf   WBC, UA 6-30 0 - 5 WBC/hpf   Bacteria, UA NONE SEEN NONE SEEN   Squamous Epithelial / LPF 0-5 (A) NONE SEEN   Mucous PRESENT    Non Squamous Epithelial 0-5 (A) NONE SEEN    Current Facility-Administered Medications  Medication Dose Route Frequency Provider Last Rate Last Dose  . aspirin EC tablet 81 mg  81 mg Oral Daily Clapacs, Madie Reno, MD      . Derrill Memo ON 03/12/2017] atorvastatin (LIPITOR) tablet 20 mg  20 mg Oral q1800 Clapacs, John T, MD      . clopidogrel (PLAVIX) tablet 75 mg  75 mg Oral Daily Clapacs, John T, MD      . pantoprazole (PROTONIX) EC tablet 40 mg  40 mg Oral Daily Clapacs, John T, MD      . sertraline (ZOLOFT) tablet 100 mg  100 mg Oral Daily  Clapacs, John T, MD      . vitamin B-12 (CYANOCOBALAMIN) tablet 1,000 mcg  1,000 mcg Oral Daily Clapacs, Madie Reno, MD       Current Outpatient Prescriptions  Medication Sig Dispense Refill  . aspirin EC 81 MG tablet Take 81 mg by mouth daily.    Marland Kitchen atorvastatin (LIPITOR) 20 MG tablet Take 20 mg by mouth daily.    . clopidogrel (PLAVIX) 75 MG tablet Take 75 mg by mouth daily.    . diphenhydrAMINE (BENADRYL) 25 MG tablet Take 25 mg by mouth 1 day or 1 dose.    . pantoprazole (PROTONIX) 40 MG tablet Take 40 mg by mouth daily.    . pseudoephedrine-acetaminophen (TYLENOL SINUS) 30-500 MG TABS tablet Take 1 tablet by mouth every 4 (four) hours as needed.    . sertraline (ZOLOFT) 100 MG tablet Take 100 mg by mouth daily.    . vitamin B-12 (CYANOCOBALAMIN) 1000 MCG tablet Take 1,000 mcg by mouth daily.    . Vitamin D, Ergocalciferol, (DRISDOL) 50000 units CAPS capsule Take 50,000 Units by mouth every 7 (seven) days.    . ALPRAZolam (XANAX) 0.25 MG tablet Take 0.25 mg by mouth every 6 (six) hours as needed for anxiety.    . Cholecalciferol (VITAMIN D3) 1000 units CAPS Take 1,000 Units by mouth daily.    . ondansetron (ZOFRAN ODT) 4 MG disintegrating tablet Take 1 tablet (4 mg total) by mouth every 8 (eight) hours as needed for nausea or vomiting. (Patient not taking: Reported on 03/11/2017) 20 tablet 0  . oxyCODONE-acetaminophen (ROXICET) 5-325 MG tablet Take 1 tablet by mouth every 6 (six) hours as needed. (Patient not taking: Reported on 07/12/2016) 20 tablet 0    Musculoskeletal: Strength & Muscle Tone: decreased Gait & Station: unsteady Patient leans: N/A  Psychiatric Specialty Exam: Physical Exam  Nursing note and vitals reviewed. Constitutional: She appears well-developed and well-nourished.  HENT:  Head: Normocephalic and atraumatic.  Eyes: Conjunctivae are normal. Pupils are equal, round, and reactive to light.  Neck: Normal range of motion.  Cardiovascular: Regular rhythm and normal heart  sounds.   Respiratory: Effort normal. No respiratory distress.  GI: Soft.  Musculoskeletal: Normal range of motion.  Neurological: She is alert.  Skin: Skin is warm and dry.  Psychiatric: Her speech is delayed. She is slowed. Cognition and memory are impaired. She expresses impulsivity. She exhibits a depressed mood. She expresses suicidal ideation. She expresses suicidal plans. She exhibits abnormal recent memory.    Review of Systems  Constitutional: Negative.   HENT:  Negative.   Eyes: Negative.   Respiratory: Negative.   Cardiovascular: Negative.   Gastrointestinal: Negative.   Musculoskeletal: Negative.   Skin: Negative.   Neurological: Negative.   Psychiatric/Behavioral: Positive for depression, hallucinations, memory loss and suicidal ideas. Negative for substance abuse. The patient is nervous/anxious and has insomnia.     Blood pressure 128/78, pulse 85, temperature 98.5 F (36.9 C), temperature source Oral, resp. rate 18, height _0  (1.575 m), weight 59 kg (130 lb), SpO2 96 %.Body mass index is 23.78 kg/m.  General Appearance: Casual  Eye Contact:  Good  Speech:  Slow  Volume:  Decreased  Mood:  Dysphoric  Affect:  Tearful  Thought Process:  Goal Directed  Orientation:  Full (Time, Place, and Person)  Thought Content:  Rumination and Tangential  Suicidal Thoughts:  Yes.  with intent/plan  Homicidal Thoughts:  No  Memory:  Immediate;   Fair Recent;   Poor Remote;   Fair  Judgement:  Fair  Insight:  Fair  Psychomotor Activity:  Decreased  Concentration:  Concentration: Fair  Recall:  [employer] of Knowledge:  Fair  Language:  Fair  Akathisia:  No  Handed:  Right  AIMS (if indicated):     Assets:  Communication Skills Housing Social Support  ADL's:  Intact  Cognition:  Impaired,  Mild  Sleep:        Treatment Plan Summary: Daily contact with patient to assess and evaluate symptoms and progress in treatment, Medication management and Plan 76 year old  woman with multiple symptoms of depression including suicidal thoughts with a specific plan. Patient presents as tearful and dysphoric. Her insight is intact. She would prefer not to be in the hospital but has admitted to suicidal thoughts does not have anyone staying at home with her. Clearly having some memory impairment as well and some hallucinations at night. For safety the only reasonable thing to do I think is to refer the patient for admission to geriatric psychiatry. Case reviewed with TTS and emergency room physician. Orders completed for her medication. Plan reviewed with patient.  Disposition: Recommend psychiatric Inpatient admission when medically cleared. Supportive therapy provided about ongoing stressors.  Alethia Berthold, MD 03/11/2017 6:02 PM

## 2017-03-11 NOTE — ED Notes (Signed)

## 2017-03-11 NOTE — ED Triage Notes (Signed)
States has had increasing depression over past 2 months. States having thoughts of harming self with plan to throw self down stairs.

## 2017-03-12 ENCOUNTER — Emergency Department: Payer: Medicare Other

## 2017-03-12 DIAGNOSIS — F329 Major depressive disorder, single episode, unspecified: Secondary | ICD-10-CM | POA: Diagnosis not present

## 2017-03-12 DIAGNOSIS — F322 Major depressive disorder, single episode, severe without psychotic features: Secondary | ICD-10-CM | POA: Diagnosis not present

## 2017-03-12 MED ORDER — FOSFOMYCIN TROMETHAMINE 3 G PO PACK
3.0000 g | PACK | Freq: Once | ORAL | Status: AC
  Administered 2017-03-12: 3 g via ORAL
  Filled 2017-03-12: qty 3

## 2017-03-12 NOTE — Progress Notes (Signed)
Referral information for Psychiatric Hospitalization faxed to;     [facility] [phone] or [phone]   [facility] Hoes [phone]),    [facility] Cella [phone]),    [address] [phone]),    [phone] or [phone]),    [facility] [phone]),

## 2017-03-12 NOTE — ED Notes (Signed)
PT's family in waiting room, asking to bring patient home. Explained to family that patient was IVC and going to [place]. Family gave this RN pt's bible and eyeglasses. Bible and eyeglasses given to patient.

## 2017-03-12 NOTE — ED Notes (Signed)
Pt on phone with son

## 2017-03-12 NOTE — BH Assessment (Signed)
Per Varney Biles:  Patient has been accepted to [facility].  Patient assigned to room 421 Bed 1. Accepting physician is Dr. Ananias Pilgrim. Attending physician is Al[place]  Call report to [phone].  Representative was Angelique.  ER Staff is aware of it [facility] Dr. Beather Arbour ER MD & Mabeline Caras Patient's Nurse)    Patient's Family/Support System [phone] Son Pieter Partridge who is her care giver) have been updated as well.

## 2017-03-12 NOTE — ED Notes (Signed)
PT on phone with daughter  

## 2017-03-12 NOTE — Consult Note (Signed)
[facility] Consult   Reason for Consult:  Consult for 76 year old woman presented to the hospital with suicidal thoughts and depression Referring Physician:  Jimmye Norman Patient Identification: Mallory Peters MRN:  [id number] Principal Diagnosis: Severe major depression, single episode, without psychotic features [facility]) Diagnosis:   Patient Active Problem List   Diagnosis Date Noted  . Severe major depression, single episode, without psychotic features ([place]) [F32.2] 03/11/2017  . Dementia of the Alzheimer's type [G30.9, F02.80] 03/11/2017    Total Time spent with patient: 15 minutes  Subjective:   Mallory Peters is a 76 y.o. female patient admitted with "I've been thinking of throwing myself down the stairs".  Follow-up consult for 76 year old woman with Maj. depression seen yesterday in the emergency room. Treatment team has been working on referral to inpatient treatment and I am told [facility] now has a bed offer and the patient can be transferred this afternoon. Patient continues to look anxious and depressed. Denies acute suicidal intent. Continues to have some confusion and memory problems but has basically been cooperative with care in the emergency room. I'm told that family has objected to the treatment plan because of concern about how far away [place] is.  HPI:  Patient interviewed chart reviewed. 76 year old woman brought to the emergency room on the referral of her primary care doctor. Patient went to see Dr. Doy Hutching today and revealed to him that she has been having thoughts of throwing herself down the stairs in her house because she is so depressed. Patient reports she is been feeling sad and depressed for months now. Mood stays down and negative. She thinks of herself as being a burden to others. She is not sleeping well at night and has started having visual hallucinations at night. She knows they are not real but they bother her nonetheless. She  admits that her appetite has been decreased and it sounds like her family has been fussing with her to try and eat normally. She has had 2 accidental falls at home over the last couple months was recently about a week ago. She feels very ashamed about these. No alcohol abuse. No substance abuse. She is prescribed Zoloft by her primary care doctor and has been compliant.  Medical history: Patient has a history of dyslipidemia past history of a stroke possibly.  Substance abuse history: No alcohol or drug abuse past or present  Social history: Patient lives by herself in a large house where she raised her family. Has been a widow for many years. Has 3 adult children one of whom lives locally. Has several younger siblings who stay in contact with her. No longer drives since realizing a couple years ago how confused she was.  Past Psychiatric History: Patient has been treated with Zoloft for years. She believes that the medicine was started around the time of her stroke. She does not recall it as being for depression so much as anxiety. No history of psychiatric hospitalization or suicide attempt or self injury in the past  Risk to Self: Is patient at risk for suicide?: Yes Risk to Others:   Prior Inpatient Therapy:   Prior Outpatient Therapy:    Past Medical History:  Past Medical History:  Diagnosis Date  . Anxiety   . CVD (cardiovascular disease)   . Depression   . Dysphagia   . GERD (gastroesophageal reflux disease)   . Hypertension   . Insomnia   . Lipids blood increased   . Meningioma ([employer])   . Non-cardiac  chest pain   . Osteoarthritis   . Polyp of cervix   . Stroke (cerebrum) [facility])     Past Surgical History:  Procedure Laterality Date  . BIOPSY SHOULDER    . CHOLECYSTECTOMY    . colonic polyps    . ESOPHAGOGASTRODUODENOSCOPY (EGD) WITH PROPOFOL N/A 07/12/2016   Procedure: ESOPHAGOGASTRODUODENOSCOPY (EGD) WITH PROPOFOL;  Surgeon: Manya Silvas, MD;  Location: [facility] ENDOSCOPY;   Service: Endoscopy;  Laterality: N/A;  . SAVORY DILATION N/A 07/12/2016   Procedure: SAVORY DILATION;  Surgeon: Manya Silvas, MD;  Location: [facility] ENDOSCOPY;  Service: Endoscopy;  Laterality: N/A;  . TUBAL LIGATION     Family History: No family history on file. Family Psychiatric  History: Positive for anxiety and depression no history of suicide Social History:  History  Alcohol Use No     History  Drug Use No    Social History   Social History  . Marital status: Widowed    Spouse name: N/A  . Number of children: N/A  . Years of education: N/A   Social History Main Topics  . Smoking status: Never Smoker  . Smokeless tobacco: Never Used  . Alcohol use No  . Drug use: No  . Sexual activity: Not Asked   Other Topics Concern  . None   Social History Narrative  . None   Additional Social History:    Allergies:   Allergies  Allergen Reactions  . Norco [Hydrocodone-Acetaminophen]     Drowsy and confused    Labs:  Results for orders placed or performed during the hospital encounter of 03/11/17 (from the past 48 hour(s))  Comprehensive metabolic panel     Status: Abnormal   Collection Time: 03/11/17  9:49 AM  Result Value Ref Range   Sodium 141 135 - 145 mmol/L   Potassium 4.6 3.5 - 5.1 mmol/L   Chloride 108 101 - 111 mmol/L   CO2 28 22 - 32 mmol/L   Glucose, Bld 92 65 - 99 mg/dL   BUN 12 6 - 20 mg/dL   Creatinine, Ser 0.79 0.44 - 1.00 mg/dL   Calcium 10.0 8.9 - 10.3 mg/dL   Total Protein 7.2 6.5 - 8.1 g/dL   Albumin 4.4 3.5 - 5.0 g/dL   AST 21 15 - 41 U/L   ALT 13 (L) 14 - 54 U/L   Alkaline Phosphatase 65 38 - 126 U/L   Total Bilirubin 0.9 0.3 - 1.2 mg/dL   GFR calc non Af Amer >60 >60 mL/min   GFR calc Af Amer >60 >60 mL/min    Comment: (NOTE) The eGFR has been calculated using the CKD EPI equation. This calculation has not been validated in all clinical situations. eGFR's persistently <60 mL/min signify possible Chronic Kidney Disease.    Anion  gap 5 5 - 15  Ethanol     Status: None   Collection Time: 03/11/17  9:49 AM  Result Value Ref Range   Alcohol, Ethyl (B) <5 <5 mg/dL    Comment:        LOWEST DETECTABLE LIMIT FOR SERUM ALCOHOL IS 5 mg/dL FOR MEDICAL PURPOSES ONLY   Salicylate level     Status: None   Collection Time: 03/11/17  9:49 AM  Result Value Ref Range   Salicylate Lvl <5.3 2.8 - 30.0 mg/dL  Acetaminophen level     Status: Abnormal   Collection Time: 03/11/17  9:49 AM  Result Value Ref Range   Acetaminophen (Tylenol), Serum <10 (L) 10 -  30 ug/mL    Comment:        THERAPEUTIC CONCENTRATIONS VARY SIGNIFICANTLY. A RANGE OF 10-30 ug/mL MAY BE AN EFFECTIVE CONCENTRATION FOR MANY PATIENTS. HOWEVER, SOME ARE BEST TREATED AT CONCENTRATIONS OUTSIDE THIS RANGE. ACETAMINOPHEN CONCENTRATIONS >150 ug/mL AT 4 HOURS AFTER INGESTION AND >50 ug/mL AT 12 HOURS AFTER INGESTION ARE OFTEN ASSOCIATED WITH TOXIC REACTIONS.   cbc     Status: Abnormal   Collection Time: 03/11/17  9:49 AM  Result Value Ref Range   WBC 5.6 3.6 - 11.0 K/uL   RBC 4.97 3.80 - 5.20 MIL/uL   Hemoglobin 13.9 12.0 - 16.0 g/dL   HCT 40.8 35.0 - 47.0 %   MCV 82.1 80.0 - 100.0 fL   MCH 27.9 26.0 - 34.0 pg   MCHC 34.0 32.0 - 36.0 g/dL   RDW 14.7 (H) 11.5 - 14.5 %   Platelets 219 150 - 440 K/uL  TSH     Status: None   Collection Time: 03/11/17  9:49 AM  Result Value Ref Range   TSH 2.534 0.350 - 4.500 uIU/mL    Comment: Performed by a 3rd Generation assay with a functional sensitivity of <=0.01 uIU/mL.  Urine Drug Screen, Qualitative     Status: None   Collection Time: 03/11/17 12:03 PM  Result Value Ref Range   Tricyclic, Ur Screen NONE DETECTED NONE DETECTED   Amphetamines, Ur Screen NONE DETECTED NONE DETECTED   MDMA (Ecstasy)Ur Screen NONE DETECTED NONE DETECTED   Cocaine Metabolite,Ur [place] NONE DETECTED NONE DETECTED   Opiate, Ur Screen NONE DETECTED NONE DETECTED   Phencyclidine (PCP) Ur S NONE DETECTED NONE DETECTED   Cannabinoid 50  Ng, Ur [place] NONE DETECTED NONE DETECTED   Barbiturates, Ur Screen NONE DETECTED NONE DETECTED   Benzodiazepine, Ur Scrn NONE DETECTED NONE DETECTED   Methadone Scn, Ur NONE DETECTED NONE DETECTED    Comment: (NOTE) 270  Tricyclics, urine               Cutoff 1000 ng/mL 200  Amphetamines, urine             Cutoff 1000 ng/mL 300  MDMA (Ecstasy), urine           Cutoff 500 ng/mL 400  Cocaine Metabolite, urine       Cutoff 300 ng/mL 500  Opiate, urine                   Cutoff 300 ng/mL 600  Phencyclidine (PCP), urine      Cutoff 25 ng/mL 700  Cannabinoid, urine              Cutoff 50 ng/mL 800  Barbiturates, urine             Cutoff 200 ng/mL 900  Benzodiazepine, urine           Cutoff 200 ng/mL 1000 Methadone, urine                Cutoff 300 ng/mL 1100 1200 The urine drug screen provides only a preliminary, unconfirmed 1300 analytical test result and should not be used for non-medical 1400 purposes. Clinical consideration and professional judgment should 1500 be applied to any positive drug screen result due to possible 1600 interfering substances. A more specific alternate chemical method 1700 must be used in order to obtain a confirmed analytical result.  1800 Gas chromato graphy / mass spectrometry (GC/MS) is the preferred 1900 confirmatory method.   Urinalysis, Complete w Microscopic  Status: Abnormal   Collection Time: 03/11/17 12:03 PM  Result Value Ref Range   Color, Urine YELLOW (A) YELLOW   APPearance CLEAR (A) CLEAR   Specific Gravity, Urine 1.009 1.005 - 1.030   pH 6.0 5.0 - 8.0   Glucose, UA NEGATIVE NEGATIVE mg/dL   Hgb urine dipstick SMALL (A) NEGATIVE   Bilirubin Urine NEGATIVE NEGATIVE   Ketones, ur NEGATIVE NEGATIVE mg/dL   Protein, ur NEGATIVE NEGATIVE mg/dL   Nitrite NEGATIVE NEGATIVE   Leukocytes, UA SMALL (A) NEGATIVE   RBC / HPF 0-5 0 - 5 RBC/hpf   WBC, UA 6-30 0 - 5 WBC/hpf   Bacteria, UA NONE SEEN NONE SEEN   Squamous Epithelial / LPF 0-5 (A) NONE SEEN    Mucous PRESENT    Non Squamous Epithelial 0-5 (A) NONE SEEN    Current Facility-Administered Medications  Medication Dose Route Frequency Provider Last Rate Last Dose  . aspirin EC tablet 81 mg  81 mg Oral Daily Kerri Kovacik, Madie Reno, MD   81 mg at 03/12/17 1028  . atorvastatin (LIPITOR) tablet 20 mg  20 mg Oral q1800 Jeananne Bedwell T, MD      . clopidogrel (PLAVIX) tablet 75 mg  75 mg Oral Daily Aishi Courts, Madie Reno, MD   75 mg at 03/12/17 1027  . pantoprazole (PROTONIX) EC tablet 40 mg  40 mg Oral Daily Shantinique Picazo, Madie Reno, MD   40 mg at 03/12/17 1027  . sertraline (ZOLOFT) tablet 100 mg  100 mg Oral Daily Carren Blakley T, MD   100 mg at 03/12/17 1027  . vitamin B-12 (CYANOCOBALAMIN) tablet 1,000 mcg  1,000 mcg Oral Daily Kealey Kemmer, Madie Reno, MD   1,000 mcg at 03/12/17 1028   Current Outpatient Prescriptions  Medication Sig Dispense Refill  . aspirin EC 81 MG tablet Take 81 mg by mouth daily.    Marland Kitchen atorvastatin (LIPITOR) 20 MG tablet Take 20 mg by mouth daily.    . clopidogrel (PLAVIX) 75 MG tablet Take 75 mg by mouth daily.    . diphenhydrAMINE (BENADRYL) 25 MG tablet Take 25 mg by mouth 1 day or 1 dose.    . pantoprazole (PROTONIX) 40 MG tablet Take 40 mg by mouth daily.    . pseudoephedrine-acetaminophen (TYLENOL SINUS) 30-500 MG TABS tablet Take 1 tablet by mouth every 4 (four) hours as needed.    . sertraline (ZOLOFT) 100 MG tablet Take 100 mg by mouth daily.    . vitamin B-12 (CYANOCOBALAMIN) 1000 MCG tablet Take 1,000 mcg by mouth daily.    . Vitamin D, Ergocalciferol, (DRISDOL) 50000 units CAPS capsule Take 50,000 Units by mouth every 7 (seven) days.    . ALPRAZolam (XANAX) 0.25 MG tablet Take 0.25 mg by mouth every 6 (six) hours as needed for anxiety.    . Cholecalciferol (VITAMIN D3) 1000 units CAPS Take 1,000 Units by mouth daily.    . ondansetron (ZOFRAN ODT) 4 MG disintegrating tablet Take 1 tablet (4 mg total) by mouth every 8 (eight) hours as needed for nausea or vomiting. (Patient not  taking: Reported on 03/11/2017) 20 tablet 0  . oxyCODONE-acetaminophen (ROXICET) 5-325 MG tablet Take 1 tablet by mouth every 6 (six) hours as needed. (Patient not taking: Reported on 07/12/2016) 20 tablet 0    Musculoskeletal: Strength & Muscle Tone: decreased Gait & Station: unsteady Patient leans: N/A  Psychiatric Specialty Exam: Physical Exam  Nursing note and vitals reviewed. Constitutional: She appears well-developed and well-nourished.  HENT:  Head: Normocephalic and  atraumatic.  Eyes: Conjunctivae are normal. Pupils are equal, round, and reactive to light.  Neck: Normal range of motion.  Cardiovascular: Regular rhythm and normal heart sounds.   Respiratory: Effort normal. No respiratory distress.  GI: Soft.  Musculoskeletal: Normal range of motion.  Neurological: She is alert.  Skin: Skin is warm and dry.  Psychiatric: Her speech is delayed. She is slowed. Cognition and memory are impaired. She expresses impulsivity. She exhibits a depressed mood. She expresses suicidal ideation. She expresses suicidal plans. She exhibits abnormal recent memory.    Review of Systems  Constitutional: Negative.   HENT: Negative.   Eyes: Negative.   Respiratory: Negative.   Cardiovascular: Negative.   Gastrointestinal: Negative.   Musculoskeletal: Negative.   Skin: Negative.   Neurological: Negative.   Psychiatric/Behavioral: Positive for depression, hallucinations, memory loss and suicidal ideas. Negative for substance abuse. The patient is nervous/anxious and has insomnia.     Blood pressure (!) 98/56, pulse 65, temperature 98 F (36.7 C), temperature source Oral, resp. rate 18, height 5' 2"  (1.575 m), weight 59 kg (130 lb), SpO2 97 %.Body mass index is 23.78 kg/m.  General Appearance: Casual  Eye Contact:  Good  Speech:  Slow  Volume:  Decreased  Mood:  Dysphoric  Affect:  Tearful  Thought Process:  Goal Directed  Orientation:  Full (Time, Place, and Person)  Thought Content:   Rumination and Tangential  Suicidal Thoughts:  Yes.  with intent/plan  Homicidal Thoughts:  No  Memory:  Immediate;   Fair Recent;   Poor Remote;   Fair  Judgement:  Fair  Insight:  Fair  Psychomotor Activity:  Decreased  Concentration:  Concentration: Fair  Recall:  [employer] of Knowledge:  Fair  Language:  Fair  Akathisia:  No  Handed:  Right  AIMS (if indicated):     Assets:  Communication Skills Housing Social Support  ADL's:  Intact  Cognition:  Impaired,  Mild  Sleep:        Treatment Plan Summary: Daily contact with patient to assess and evaluate symptoms and progress in treatment, Medication management and Plan I do regret the way that treatment here is inconvenient for the patient but I remain concerned about her depression and suicidality particularly given that she lives alone and is not currently getting any improvement in her depression and also has significant dementia which limits her ability to take care of herself. I am glad [facility] is going to admit her. I'm told that Dr. Beather Arbour put her under commitment last night which will facilitate the transfer. No changed any medicine. Reviewed situation with ER staff and TTS.  Disposition: Recommend psychiatric Inpatient admission when medically cleared. Supportive therapy provided about ongoing stressors.  Alethia Berthold, MD 03/12/2017 12:59 PM

## 2017-03-12 NOTE — ED Notes (Signed)
Pt speaking with son over the phone.

## 2017-03-12 NOTE — ED Provider Notes (Signed)
Patient reassessed prior to transfer to [facility] [place] hemodynamically stable. No acute distress.   Merlyn Lot, MD 03/12/17 [phone]

## 2017-03-12 NOTE — ED Notes (Signed)
Pt took shower and linen changed.

## 2017-03-12 NOTE — ED Provider Notes (Signed)
-----------------------------------------   2:54 AM on 03/12/2017 -----------------------------------------  Given EKG which was previously ordered:  ED ECG REPORT I, Rayni Nemitz J, the attending physician, personally viewed and interpreted this ECG.   Date: 03/12/2017  EKG Time: 0249  Rate: 73  Rhythm: normal EKG, normal sinus rhythm  Axis: Normal  Intervals:none  ST&T Change: Nonspecific  ----------------------------------------- 6:15 AM on 03/12/2017 -----------------------------------------  Patient most likely will have a bed at [facility]. I was asked to order chest x-ray and to place patient under involuntary commitment to facilitate transport for geriatric/psychiatric placement. Fosfomycin ordered for mild UTI.  ----------------------------------------- 6:51 AM on 03/12/2017 -----------------------------------------  Chest x-ray interpreted per Dr. Gerilyn Nestle: No active cardiopulmonary disease.    Paulette Blanch, MD 03/12/17 [phone]

## 2017-07-12 IMAGING — MR MR HEAD WO/W CM
12 series · 48 of 48 positions shown · IV contrast (12ml MULTIHANCE)
Comparison: Brain MRI 02/20/2016 and 04/12/2009

CLINICAL DATA: Vocal hoarseness.  Difficulty swallowing.

EXAM:
MRI HEAD WITHOUT AND WITH CONTRAST
TECHNIQUE: Multiplanar, multiecho pulse sequences of the brain and surrounding
structures were obtained without and with intravenous contrast.
CONTRAST:  12mL MULTIHANCE GADOBENATE DIMEGLUMINE 529 MG/ML IV SOLN

[Series 2: T1 · sagittal · 5.0mm · 0.45mm/px · 2 of 27 slices shown (1 of 2)]
[im 1/27]
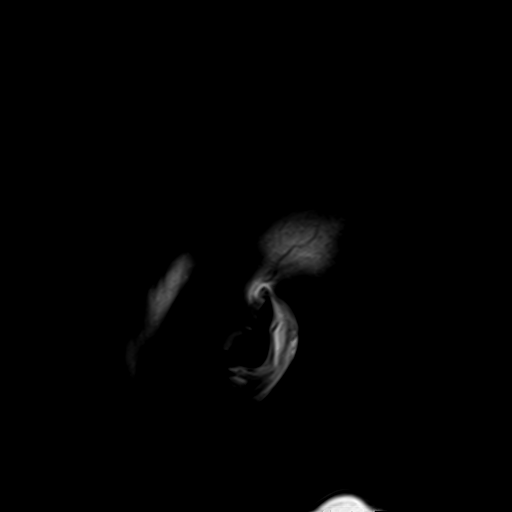
[im 27/27]
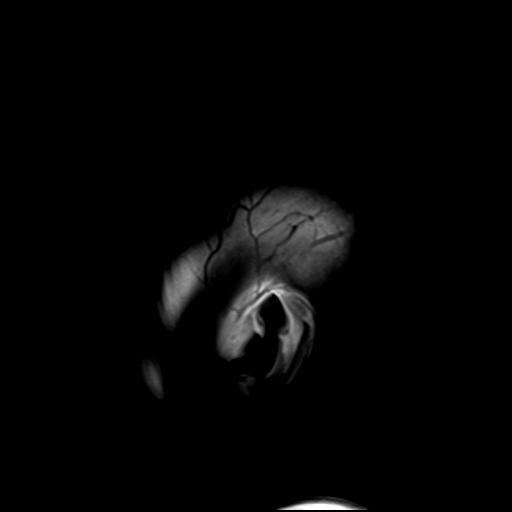

[Series 4: DWI · axial · 3.0mm · 1.80mm/px · z∈[-28,+121]mm · 4 of 54 slices shown (1 of 2)]
[im 1/54]
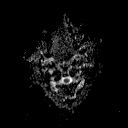
[im 18/54]
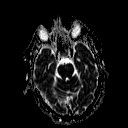
[im 36/54]
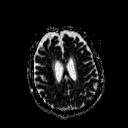
[im 54/54]
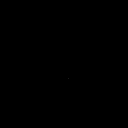

[Series 6: DWI · coronal · 3.0mm · 1.80mm/px · 5 of 45 slices shown (2 of 2)]
[im 1/45]
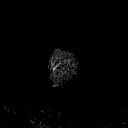
[im 12/45]
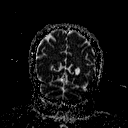
[im 23/45]
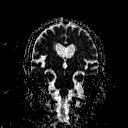
[im 34/45]
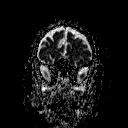
[im 45/45]
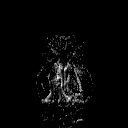

[Series 7: T2 · axial · 5.0mm · 0.60mm/px · z∈[-28,+118]mm · 3 of 25 slices shown (1 of 2)]
[im 1/25]
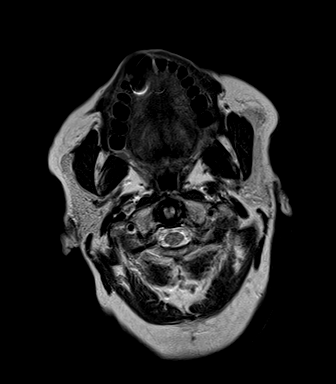
[im 13/25]
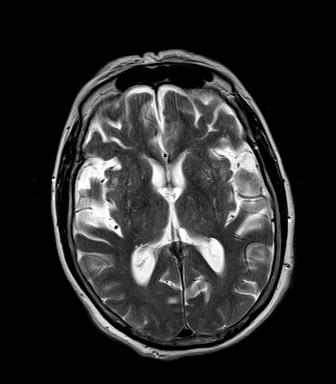
[im 25/25]
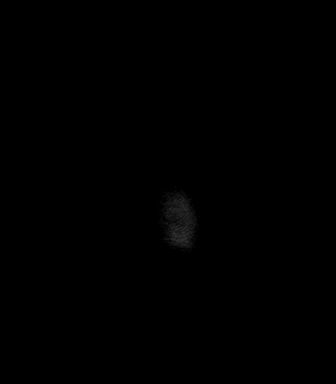

[Series 8: FLAIR · axial · 5.0mm · 0.45mm/px · z∈[-28,+118]mm · 3 of 25 slices shown]
[im 1/25]
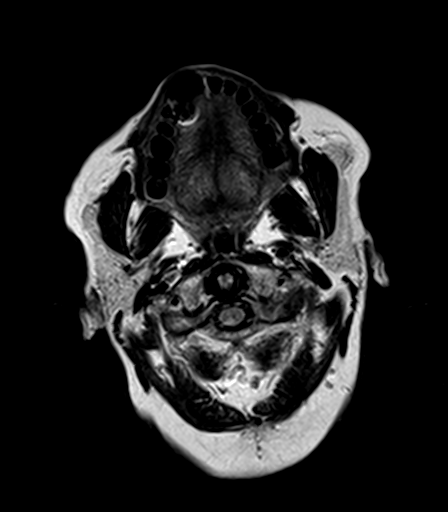
[im 13/25]
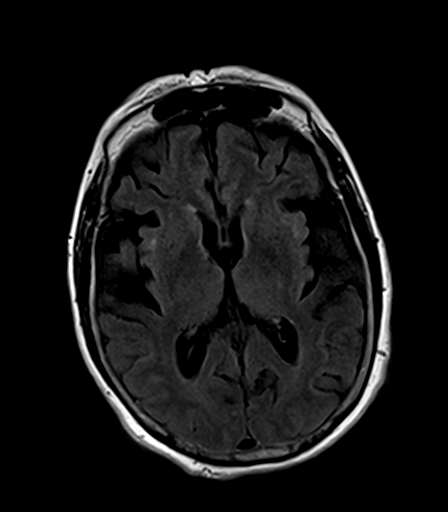
[im 25/25]
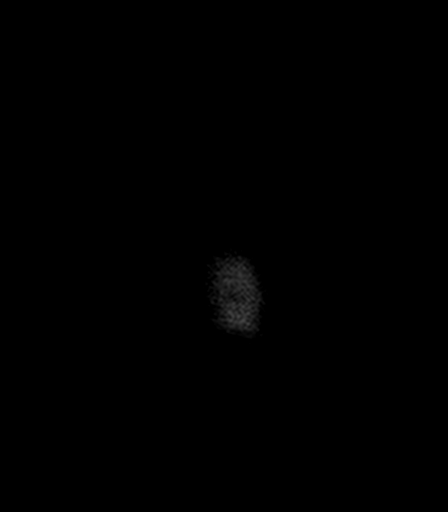

[Series 9: T2 · axial · 5.0mm · 0.45mm/px · z∈[-28,+118]mm · 3 of 25 slices shown (2 of 2)]
[im 1/25]
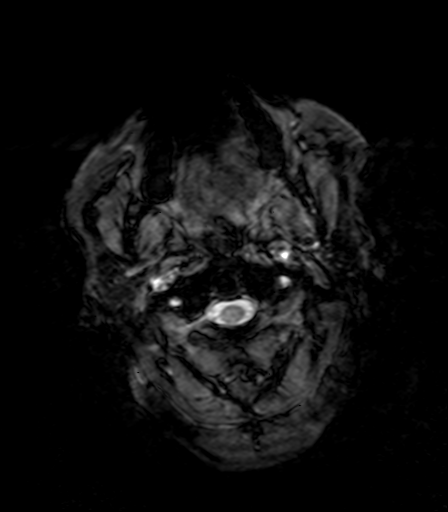
[im 13/25]
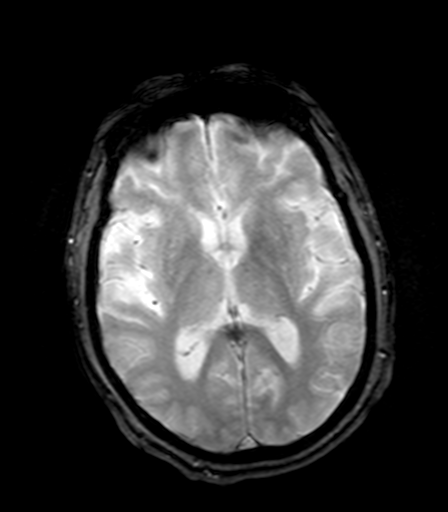
[im 25/25]
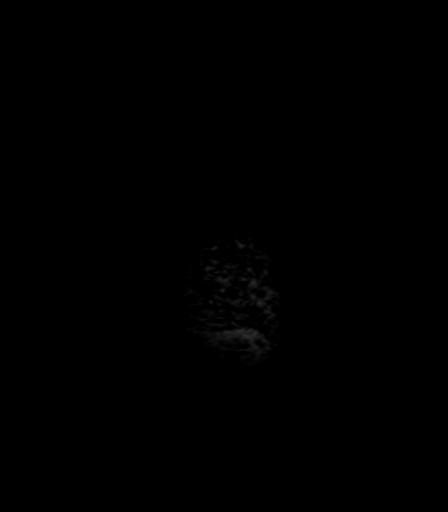

[Series 10: T1 · axial · 3.0mm · 1.00mm/px · z∈[-34,+132]mm · 6 of 60 slices shown (2 of 2)]
[im 1/60]
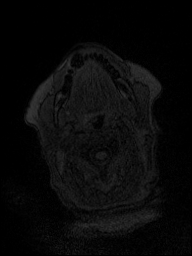
[im 12/60]
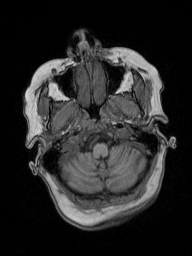
[im 24/60]
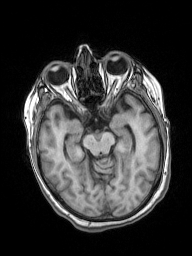
[im 36/60]
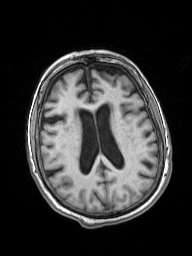
[im 48/60]
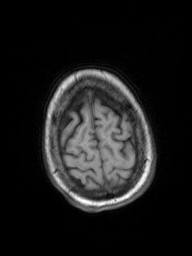
[im 60/60]
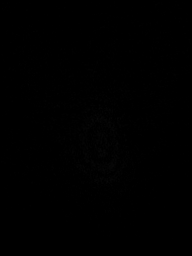

[Series 11: T2 post-contrast · coronal · 5.0mm · 0.49mm/px · 3 of 27 slices shown]
[im 1/27]
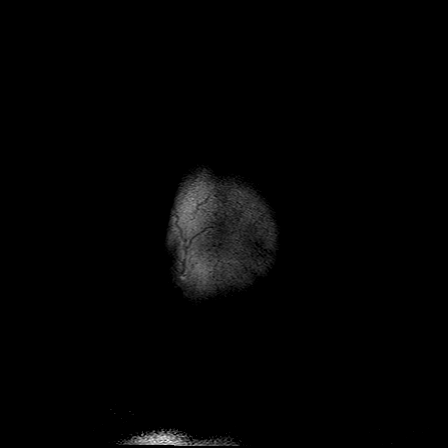
[im 14/27]
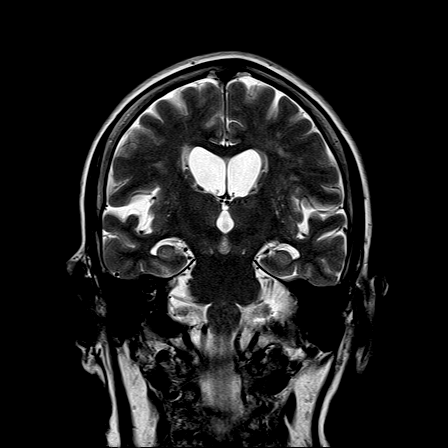
[im 27/27]
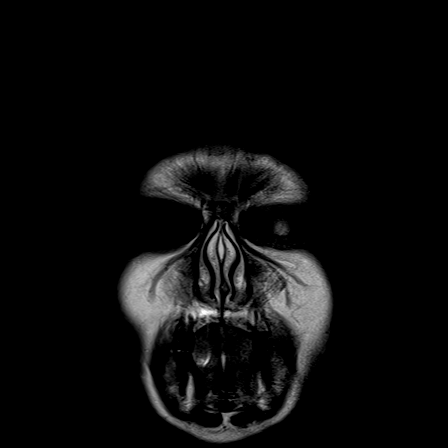

[Series 12: T1 post-contrast · axial · 3.0mm · 1.00mm/px · z∈[-34,+132]mm · 6 of 60 slices shown (1 of 2)]
[im 1/60]
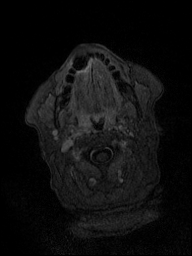
[im 12/60]
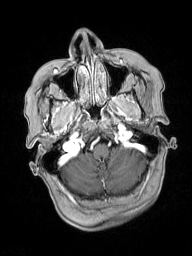
[im 24/60]
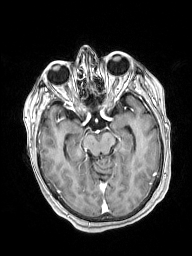
[im 36/60]
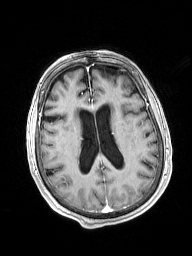
[im 48/60]
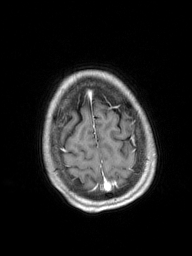
[im 60/60]
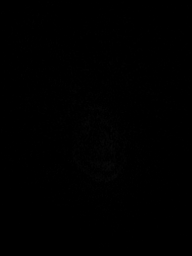

[Series 13: T1 post-contrast · coronal · 5.0mm · 0.43mm/px · 3 of 27 slices shown (2 of 2)]
[im 1/27]
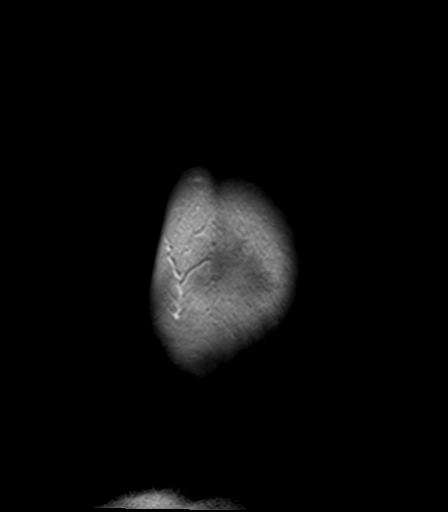
[im 14/27]
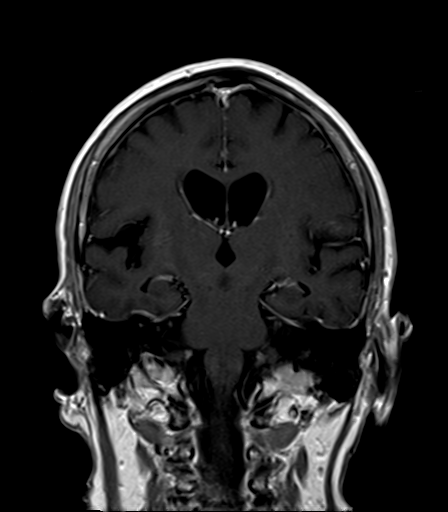
[im 27/27]
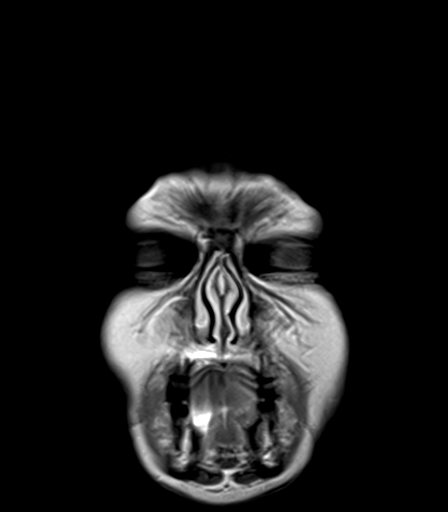

[Series 100: (id) · axial · 3.0mm · 1.80mm/px · z∈[-28,+124]mm · 5 of 54 slices shown]
[im 1/54]
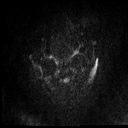
[im 14/54]
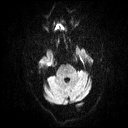
[im 27/54]
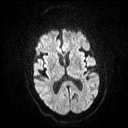
[im 40/54]
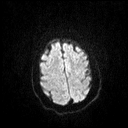
[im 54/54]
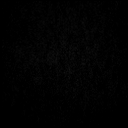

[Series 101: (id) cor · coronal · 3.0mm · 1.80mm/px · 5 of 45 slices shown]
[im 1/45]
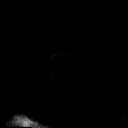
[im 12/45]
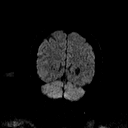
[im 23/45]
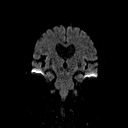
[im 34/45]
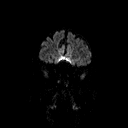
[im 45/45]
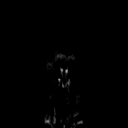

[48 of 48 positions shown; findings below may reference images not displayed]

FINDINGS: Brain: No acute infarct or intracranial hemorrhage. The midline
structures are normal. There is multifocal hyperintense T2 weighted
signal within the periventricular and subcortical white matter
suggesting chronic microvascular ischemia. The distribution of these
lesions is unchanged. Left convexity meningioma is unchanged in size
measuring 11 x 10 mm. 8 seconds meningioma along the floor of the
right middle cranial fossa is also unchanged in size measuring 8 x 5
mm. No hydrocephalus or extra-axial fluid collection.

Vascular: Major intracranial flow voids are preserved. No evidence
of chronic microhemorrhage or amyloid angiopathy.

Skull and upper cervical spine: The visualized skull base,
calvarium, upper cervical spine and extracranial soft tissues are
normal.

Sinuses/Orbits: No fluid levels or advanced mucosal thickening. No
mastoid effusion. Normal orbits.
IMPRESSION: 1. Unchanged meningiomas along the left frontal convexity and floor
of the right middle cranial fossa.
2. Chronic microvascular disease.

## 2018-04-28 ENCOUNTER — Ambulatory Visit: Payer: Medicare Other | Admitting: Anesthesiology

## 2018-04-28 ENCOUNTER — Encounter
Admission: RE | Disposition: A | Discharge status: Home / Self Care | Payer: Self-pay | Source: Ambulatory Visit | Attending: Unknown Physician Specialty

## 2018-04-28 ENCOUNTER — Encounter: Payer: Self-pay | Admitting: *Deleted

## 2018-04-28 ENCOUNTER — Ambulatory Visit
Admission: RE | Admit: 2018-04-28 | Discharge: 2018-04-28 | Disposition: A | Discharge status: Home / Self Care | Payer: Medicare Other | Source: Ambulatory Visit | Attending: Unknown Physician Specialty | Admitting: Unknown Physician Specialty

## 2018-04-28 DIAGNOSIS — Z8673 Personal history of transient ischemic attack (TIA), and cerebral infarction without residual deficits: Secondary | ICD-10-CM | POA: Insufficient documentation

## 2018-04-28 DIAGNOSIS — Z7902 Long term (current) use of antithrombotics/antiplatelets: Secondary | ICD-10-CM | POA: Insufficient documentation

## 2018-04-28 DIAGNOSIS — Z7982 Long term (current) use of aspirin: Secondary | ICD-10-CM | POA: Diagnosis not present

## 2018-04-28 DIAGNOSIS — F0390 Unspecified dementia without behavioral disturbance: Secondary | ICD-10-CM | POA: Insufficient documentation

## 2018-04-28 DIAGNOSIS — K640 First degree hemorrhoids: Secondary | ICD-10-CM | POA: Insufficient documentation

## 2018-04-28 DIAGNOSIS — F419 Anxiety disorder, unspecified: Secondary | ICD-10-CM | POA: Insufficient documentation

## 2018-04-28 DIAGNOSIS — K219 Gastro-esophageal reflux disease without esophagitis: Secondary | ICD-10-CM | POA: Diagnosis not present

## 2018-04-28 DIAGNOSIS — Z79899 Other long term (current) drug therapy: Secondary | ICD-10-CM | POA: Insufficient documentation

## 2018-04-28 DIAGNOSIS — D122 Benign neoplasm of ascending colon: Secondary | ICD-10-CM | POA: Diagnosis not present

## 2018-04-28 DIAGNOSIS — E538 Deficiency of other specified B group vitamins: Secondary | ICD-10-CM | POA: Diagnosis not present

## 2018-04-28 DIAGNOSIS — G4700 Insomnia, unspecified: Secondary | ICD-10-CM | POA: Diagnosis not present

## 2018-04-28 DIAGNOSIS — K625 Hemorrhage of anus and rectum: Secondary | ICD-10-CM | POA: Diagnosis not present

## 2018-04-28 DIAGNOSIS — F329 Major depressive disorder, single episode, unspecified: Secondary | ICD-10-CM | POA: Diagnosis not present

## 2018-04-28 DIAGNOSIS — E785 Hyperlipidemia, unspecified: Secondary | ICD-10-CM | POA: Diagnosis not present

## 2018-04-28 DIAGNOSIS — Z885 Allergy status to narcotic agent status: Secondary | ICD-10-CM | POA: Insufficient documentation

## 2018-04-28 DIAGNOSIS — Z1211 Encounter for screening for malignant neoplasm of colon: Secondary | ICD-10-CM | POA: Insufficient documentation

## 2018-04-28 DIAGNOSIS — K5730 Diverticulosis of large intestine without perforation or abscess without bleeding: Secondary | ICD-10-CM | POA: Diagnosis not present

## 2018-04-28 DIAGNOSIS — M1990 Unspecified osteoarthritis, unspecified site: Secondary | ICD-10-CM | POA: Insufficient documentation

## 2018-04-28 DIAGNOSIS — Z86010 Personal history of colonic polyps: Secondary | ICD-10-CM | POA: Diagnosis not present

## 2018-04-28 DIAGNOSIS — I10 Essential (primary) hypertension: Secondary | ICD-10-CM | POA: Diagnosis not present

## 2018-04-28 HISTORY — DX: Other intervertebral disc displacement, lumbar region: M51.26

## 2018-04-28 HISTORY — PX: COLONOSCOPY WITH PROPOFOL: SHX5780

## 2018-04-28 HISTORY — DX: Dyspnea, unspecified: R06.00

## 2018-04-28 HISTORY — DX: Unspecified hemorrhoids: K64.9

## 2018-04-28 HISTORY — DX: Angina pectoris, unspecified: I20.9

## 2018-04-28 HISTORY — DX: Gastritis, unspecified, without bleeding: K29.70

## 2018-04-28 HISTORY — DX: Deficiency of other specified B group vitamins: E53.8

## 2018-04-28 HISTORY — DX: Hyperlipidemia, unspecified: E78.5

## 2018-04-28 HISTORY — DX: Dyskinesia of esophagus: K22.4

## 2018-04-28 HISTORY — DX: Polyp of colon: K63.5

## 2018-04-28 SURGERY — COLONOSCOPY WITH PROPOFOL
Anesthesia: General

## 2018-04-28 MED ORDER — PROPOFOL 500 MG/50ML IV EMUL
INTRAVENOUS | Status: DC | PRN
  Administered 2018-04-28: 140 ug/kg/min via INTRAVENOUS
  Administered 2018-04-28: 180 ug/kg/min via INTRAVENOUS

## 2018-04-28 MED ORDER — SODIUM CHLORIDE 0.9 % IV SOLN
INTRAVENOUS | Status: DC
  Administered 2018-04-28: 15:00:00 via INTRAVENOUS

## 2018-04-28 MED ORDER — SODIUM CHLORIDE 0.9 % IV SOLN: INTRAVENOUS | Status: DC

## 2018-04-28 MED ORDER — EPHEDRINE SULFATE 50 MG/ML IJ SOLN
INTRAMUSCULAR | Status: AC
  Filled 2018-04-28: qty 1

## 2018-04-28 MED ORDER — EPHEDRINE SULFATE 50 MG/ML IJ SOLN
INTRAMUSCULAR | Status: DC | PRN
  Administered 2018-04-28: 10 mg via INTRAVENOUS

## 2018-04-28 MED ORDER — PROPOFOL 500 MG/50ML IV EMUL
INTRAVENOUS | Status: AC
  Filled 2018-04-28: qty 50

## 2018-04-28 NOTE — Anesthesia Postprocedure Evaluation (Signed)
Anesthesia Post Note  Patient: Mallory Peters  Procedure(s) Performed: COLONOSCOPY WITH PROPOFOL (N/A )  Patient location during evaluation: Endoscopy Anesthesia Type: General Level of consciousness: awake and alert Pain management: pain level controlled Vital Signs Assessment: post-procedure vital signs reviewed and stable Respiratory status: spontaneous breathing and respiratory function stable Cardiovascular status: stable Anesthetic complications: no     Last Vitals:  Vitals:   04/28/18 1533 04/28/18 1543  BP: (!) 116/99 94/60  Pulse: 72 78  Resp: 18 (!) 22  Temp: (!) 36.1 C   SpO2: 97% 97%    Last Pain:  Vitals:   04/28/18 1543  TempSrc:   PainSc: 0-No pain                 KEPHART,WILLIAM K

## 2018-04-28 NOTE — Anesthesia Preprocedure Evaluation (Signed)
Anesthesia Evaluation  Patient identified by MRN, date of birth, ID band Patient awake    Reviewed: Allergy & Precautions, NPO status , Patient's Chart, lab work & pertinent test results  History of Anesthesia Complications Negative for: history of anesthetic complications  Airway Mallampati: II       Dental  (+) Teeth Intact, Dental Advidsory Given, Caps   Pulmonary shortness of breath and with exertion, neg sleep apnea, neg COPD, neg recent URI,           Cardiovascular Exercise Tolerance: Good hypertension, (-) angina(-) CAD, (-) Past MI, (-) Cardiac Stents and (-) CABG (-) dysrhythmias (-) Valvular Problems/Murmurs     Neuro/Psych neg Seizures PSYCHIATRIC DISORDERS Anxiety Depression Dementia CVA, Residual Symptoms    GI/Hepatic Neg liver ROS, GERD  Medicated,  Endo/Other  negative endocrine ROS  Renal/GU negative Renal ROS     Musculoskeletal   Abdominal Normal abdominal exam  (+)   Peds negative pediatric ROS (+)  Hematology negative hematology ROS (+)   Anesthesia Other Findings Past Medical History: No date: Anginal pain ([place]) No date: Anxiety No date: B12 deficiency No date: Colon polyps No date: CVD (cardiovascular disease) No date: Depression No date: Dysphagia No date: Dyspnea No date: Esophageal dysmotility No date: Gastritis No date: GERD (gastroesophageal reflux disease) No date: Hemorrhoids No date: HNP (herniated nucleus pulposus), lumbar No date: Hyperlipidemia No date: Hypertension No date: Insomnia No date: Lipids blood increased No date: Meningioma ([facility]) No date: Non-cardiac chest pain No date: Osteoarthritis No date: Polyp of cervix No date: Stroke (cerebrum) ([facility])   Reproductive/Obstetrics negative OB ROS                             Anesthesia Physical  Anesthesia Plan  ASA: III  Anesthesia Plan: General   Post-op Pain Management:     Induction: Intravenous  PONV Risk Score and Plan: 3 and Propofol infusion and TIVA  Airway Management Planned: Natural Airway and Nasal Cannula  Additional Equipment:   Intra-op Plan:   Post-operative Plan:   Informed Consent: I have reviewed the patients History and Physical, chart, labs and discussed the procedure including the risks, benefits and alternatives for the proposed anesthesia with the patient or authorized representative who has indicated his/her understanding and acceptance.     Plan Discussed with: CRNA  Anesthesia Plan Comments:         Anesthesia Quick Evaluation

## 2018-04-28 NOTE — Transfer of Care (Signed)
Immediate Anesthesia Transfer of Care Note  Patient: Mallory Peters  Procedure(s) Performed: COLONOSCOPY WITH PROPOFOL (N/A )  Patient Location:  PACu   Anesthesia Type:General  Level of Consciousness: awake  Airway & Oxygen Therapy: Patient Spontanous Breathing and Patient connected to nasal cannula oxygen  Post-op Assessment: Report given to RN and Post -op Vital signs reviewed and stable  Post vital signs: Reviewed and stable  Last Vitals:  Vitals Value Taken Time  BP 116/99 04/28/2018  3:33 PM  Temp 36.1 C 04/28/2018  3:33 PM  Pulse 72 04/28/2018  3:33 PM  Resp 18 04/28/2018  3:33 PM  SpO2 97 % 04/28/2018  3:33 PM    Last Pain:  Vitals:   04/28/18 1533  TempSrc:   PainSc: Asleep         Complications: No apparent anesthesia complications

## 2018-04-28 NOTE — Op Note (Signed)
[facility] Gastroenterology Patient Name: Mallory Peters Procedure Date: 04/28/2018 3:05 PM MRN: [id number] Account #: [id number] Date of Birth: Mar 12, 1941 Admit Type: Outpatient Age: 77 Room: [facility] ENDO ROOM 3 Gender: Female Note Status: Finalized Procedure:            Colonoscopy Indications:          High risk colon cancer surveillance: Personal history                        of colonic polyps Providers:            Manya Silvas, MD Referring MD:         Leonie Douglas. Doy Hutching, MD (Referring MD) Medicines:            Propofol per Anesthesia Complications:        No immediate complications. Procedure:            Pre-Anesthesia Assessment:                       - After reviewing the risks and benefits, the patient                        was deemed in satisfactory condition to undergo the                        procedure.                       After obtaining informed consent, the colonoscope was                        passed under direct vision. Throughout the procedure,                        the patient's blood pressure, pulse, and oxygen                        saturations were monitored continuously. The                        Colonoscope was introduced through the anus and                        advanced to the the cecum, identified by appendiceal                        orifice and ileocecal valve. The colonoscopy was                        performed without difficulty. The patient tolerated the                        procedure well. The quality of the bowel preparation                        was good. Findings:      A diminutive polyp was found in the ascending colon. The polyp was       sessile. The polyp was removed with a jumbo cold forceps. Resection and       retrieval were complete.      A few small-mouthed diverticula  were found in the sigmoid colon.      Internal hemorrhoids were found during endoscopy. The hemorrhoids were       small and Grade  I (internal hemorrhoids that do not prolapse).      The exam was otherwise without abnormality. Impression:           - One diminutive polyp in the ascending colon, removed                        with a jumbo cold forceps. Resected and retrieved.                       - Diverticulosis in the sigmoid colon.                       - Internal hemorrhoids.                       - The examination was otherwise normal. Recommendation:       - Await pathology results. Manya Silvas, MD 04/28/2018 3:30:37 PM This report has been signed electronically. Number of Addenda: 0 Note Initiated On: 04/28/2018 3:05 PM Scope Withdrawal Time: 0 hours 10 minutes 58 seconds  Total Procedure Duration: 0 hours 16 minutes 19 seconds       [facility]

## 2018-04-28 NOTE — Anesthesia Post-op Follow-up Note (Signed)
Anesthesia QCDR form completed.        

## 2018-04-28 NOTE — H&P (Signed)
Primary Care Physician:  Idelle Crouch, MD Primary Gastroenterologist:  Dr. Vira Agar  Pre-Procedure History & Physical: HPI:  Mallory Peters is a 77 y.o. female is here for an colonoscopy.Done for [facility] colon polyps and rectal bleeding.  Past Medical History:  Diagnosis Date  . Anginal pain ([place])   . Anxiety   . B12 deficiency   . Colon polyps   . CVD (cardiovascular disease)   . Depression   . Dysphagia   . Dyspnea   . Esophageal dysmotility   . Gastritis   . GERD (gastroesophageal reflux disease)   . Hemorrhoids   . HNP (herniated nucleus pulposus), lumbar   . Hyperlipidemia   . Hypertension   . Insomnia   . Lipids blood increased   . Meningioma ([place])   . Non-cardiac chest pain   . Osteoarthritis   . Polyp of cervix   . Stroke (cerebrum) [facility])     Past Surgical History:  Procedure Laterality Date  . BIOPSY SHOULDER    . CHOLECYSTECTOMY    . colonic polyps    . COLONOSCOPY    . ESOPHAGOGASTRODUODENOSCOPY (EGD) WITH PROPOFOL N/A 07/12/2016   Procedure: ESOPHAGOGASTRODUODENOSCOPY (EGD) WITH PROPOFOL;  Surgeon: Manya Silvas, MD;  Location: [facility] ENDOSCOPY;  Service: Endoscopy;  Laterality: N/A;  . SAVORY DILATION N/A 07/12/2016   Procedure: SAVORY DILATION;  Surgeon: Manya Silvas, MD;  Location: [facility] ENDOSCOPY;  Service: Endoscopy;  Laterality: N/A;  . TUBAL LIGATION      Prior to Admission medications   Medication Sig Start Date End Date Taking? Authorizing Provider  ALPRAZolam (XANAX) 0.25 MG tablet Take 0.25 mg by mouth every 6 (six) hours as needed for anxiety.   Yes [provider]  aspirin EC 81 MG tablet Take 81 mg by mouth daily.   Yes [provider]  atorvastatin (LIPITOR) 20 MG tablet Take 20 mg by mouth daily.   Yes [provider]  Cholecalciferol (VITAMIN D3) 1000 units CAPS Take 1,000 Units by mouth daily.   Yes [provider]  clopidogrel (PLAVIX) 75 MG tablet Take 75 mg by mouth daily.   Yes [provider]  metoprolol tartrate (LOPRESSOR) 25 MG tablet Take 25 mg by mouth 2 (two) times daily.   Yes [provider]  sertraline (ZOLOFT) 100 MG tablet Take 100 mg by mouth daily.   Yes [provider]  vitamin B-12 (CYANOCOBALAMIN) 1000 MCG tablet Take 1,000 mcg by mouth daily.   Yes [provider]  Vitamin D, Ergocalciferol, (DRISDOL) 50000 units CAPS capsule Take 50,000 Units by mouth every 7 (seven) days. 03/07/17  Yes [provider]  diphenhydrAMINE (BENADRYL) 25 MG tablet Take 25 mg by mouth 1 day or 1 dose.    [provider]  ondansetron (ZOFRAN ODT) 4 MG disintegrating tablet Take 1 tablet (4 mg total) by mouth every 8 (eight) hours as needed for nausea or vomiting. Patient not taking: Reported on 03/11/2017 11/19/15   Harvest Dark, MD  oxyCODONE-acetaminophen (ROXICET) 5-325 MG tablet Take 1 tablet by mouth every 6 (six) hours as needed. Patient not taking: Reported on 07/12/2016 11/19/15   Harvest Dark, MD  pantoprazole (PROTONIX) 40 MG tablet Take 40 mg by mouth daily.    [provider]  pseudoephedrine-acetaminophen (TYLENOL SINUS) 30-500 MG TABS tablet Take 1 tablet by mouth every 4 (four) hours as needed.    [provider]    Allergies as of 02/21/2018 - Review Complete 03/11/2017  Allergen  Reaction Noted  . Norco [hydrocodone-acetaminophen]  07/11/2016    History reviewed. No pertinent family history.  Social History   Socioeconomic History  . Marital status: Widowed    Spouse name: Not on file  . Number of children: Not on file  . Years of education: Not on file  . Highest education level: Not on file  Occupational History  . Not on file  Social Needs  . Financial resource strain: Not on file  . Food insecurity:    Worry: Not on file    Inability: Not on file  . Transportation needs:    Medical: Not on file    Non-medical: Not on file  Tobacco Use  . Smoking status: Never Smoker   . Smokeless tobacco: Never Used  Substance and Sexual Activity  . Alcohol use: No  . Drug use: No  . Sexual activity: Not on file  Lifestyle  . Physical activity:    Days per week: Not on file    Minutes per session: Not on file  . Stress: Not on file  Relationships  . Social connections:    Talks on phone: Not on file    Gets together: Not on file    Attends religious service: Not on file    Active member of club or organization: Not on file    Attends meetings of clubs or organizations: Not on file    Relationship status: Not on file  . Intimate partner violence:    Fear of current or ex partner: Not on file    Emotionally abused: Not on file    Physically abused: Not on file    Forced sexual activity: Not on file  Other Topics Concern  . Not on file  Social History Narrative  . Not on file    Review of Systems: See HPI, otherwise negative ROS  Physical Exam: BP 113/85   Pulse 76   Temp (!) 97.4 F (36.3 C) (Tympanic)   Resp 16   Ht 5\' 2"  (1.575 m)   Wt 56.7 kg (125 lb)   SpO2 99%   BMI 22.86 kg/m  General:   Alert,  pleasant and cooperative in NAD Head:  Normocephalic and atraumatic. Neck:  Supple; no masses or thyromegaly. Lungs:  Clear throughout to auscultation.    Heart:  Regular rate and rhythm. Abdomen:  Soft, nontender and nondistended. Normal bowel sounds, without guarding, and without rebound.   Neurologic:  Alert and  oriented x4;  grossly normal neurologically.  Impression/Plan: Mallory Peters is here for an colonoscopy to be performed for [facility] colon polyps and rectal bleeding.  Risks, benefits, limitations, and alternatives regarding  colonoscopy have been reviewed with the patient.  Questions have been answered.  All parties agreeable.   Gaylyn Cheers, MD  04/28/2018, 3:01 PM

## 2018-04-29 ENCOUNTER — Encounter: Payer: Self-pay | Admitting: Unknown Physician Specialty

## 2018-04-30 LAB — SURGICAL PATHOLOGY

## 2018-12-25 ENCOUNTER — Other Ambulatory Visit: Payer: Self-pay

## 2018-12-25 ENCOUNTER — Emergency Department: Payer: Medicare Other

## 2018-12-25 ENCOUNTER — Encounter: Payer: Self-pay | Admitting: Emergency Medicine

## 2018-12-25 ENCOUNTER — Emergency Department
Admission: EM | Admit: 2018-12-25 | Discharge: 2018-12-25 | Disposition: A | Discharge status: Home / Self Care | Payer: Medicare Other | Attending: Emergency Medicine | Admitting: Emergency Medicine

## 2018-12-25 DIAGNOSIS — R079 Chest pain, unspecified: Secondary | ICD-10-CM | POA: Insufficient documentation

## 2018-12-25 DIAGNOSIS — Z5321 Procedure and treatment not carried out due to patient leaving prior to being seen by health care provider: Secondary | ICD-10-CM | POA: Insufficient documentation

## 2018-12-25 LAB — CBC WITH DIFFERENTIAL/PLATELET
Abs Immature Granulocytes: 0.02 10*3/uL (ref 0.00–0.07)
Basophils Absolute: 0.1 10*3/uL (ref 0.0–0.1)
Basophils Relative: 1 %
Eosinophils Absolute: 0.1 10*3/uL (ref 0.0–0.5)
Eosinophils Relative: 1 %
HCT: 44.4 % (ref 36.0–46.0)
Hemoglobin: 14.6 g/dL (ref 12.0–15.0)
Immature Granulocytes: 0 %
Lymphocytes Relative: 13 %
Lymphs Abs: 1.1 10*3/uL (ref 0.7–4.0)
MCH: 27.9 pg (ref 26.0–34.0)
MCHC: 32.9 g/dL (ref 30.0–36.0)
MCV: 84.9 fL (ref 80.0–100.0)
MONO ABS: 0.3 10*3/uL (ref 0.1–1.0)
MONOS PCT: 4 %
Neutro Abs: 6.5 10*3/uL (ref 1.7–7.7)
Neutrophils Relative %: 81 %
Platelets: 235 10*3/uL (ref 150–400)
RBC: 5.23 MIL/uL — ABNORMAL HIGH (ref 3.87–5.11)
RDW: 13 % (ref 11.5–15.5)
WBC: 8 10*3/uL (ref 4.0–10.5)
nRBC: 0 % (ref 0.0–0.2)

## 2018-12-25 LAB — COMPREHENSIVE METABOLIC PANEL
ALT: 16 U/L (ref 0–44)
ANION GAP: 12 (ref 5–15)
AST: 22 U/L (ref 15–41)
Albumin: 4.5 g/dL (ref 3.5–5.0)
Alkaline Phosphatase: 63 U/L (ref 38–126)
BUN: 12 mg/dL (ref 8–23)
CO2: 22 mmol/L (ref 22–32)
Calcium: 10.1 mg/dL (ref 8.9–10.3)
Chloride: 106 mmol/L (ref 98–111)
Creatinine, Ser: 0.67 mg/dL (ref 0.44–1.00)
GFR calc Af Amer: >60 mL/min (ref >60)
GFR calc non Af Amer: >60 mL/min (ref >60)
Glucose, Bld: 116 mg/dL — ABNORMAL HIGH (ref 70–99)
Potassium: 3.9 mmol/L (ref 3.5–5.1)
Sodium: 140 mmol/L (ref 135–145)
Total Bilirubin: 1.3 mg/dL — ABNORMAL HIGH (ref 0.3–1.2)
Total Protein: 7.6 g/dL (ref 6.5–8.1)

## 2018-12-25 LAB — TROPONIN I

## 2018-12-25 NOTE — ED Triage Notes (Signed)
Pt to triage via w/c with no distress noted, brought in by EMS from home for c/o CP; pt reports "a little bit of clicking in here"; st some mid CP with no accomp symptoms; denies hx of same

## 2018-12-26 ENCOUNTER — Ambulatory Visit
Admission: RE | Admit: 2018-12-26 | Discharge: 2018-12-26 | Disposition: A | Discharge status: Home / Self Care | Payer: Medicare Other | Source: Ambulatory Visit | Attending: Family Medicine | Admitting: Family Medicine

## 2018-12-26 ENCOUNTER — Other Ambulatory Visit: Payer: Self-pay | Admitting: Family Medicine

## 2018-12-26 ENCOUNTER — Other Ambulatory Visit (HOSPITAL_COMMUNITY): Payer: Self-pay | Admitting: Family Medicine

## 2018-12-26 DIAGNOSIS — R531 Weakness: Secondary | ICD-10-CM

## 2018-12-26 DIAGNOSIS — R4182 Altered mental status, unspecified: Secondary | ICD-10-CM | POA: Insufficient documentation

## 2019-08-26 ENCOUNTER — Emergency Department
Admission: EM | Admit: 2019-08-26 | Discharge: 2019-08-26 | Disposition: A | Discharge status: Home / Self Care | Payer: Medicare Other | Attending: Emergency Medicine | Admitting: Emergency Medicine

## 2019-08-26 ENCOUNTER — Emergency Department: Payer: Medicare Other

## 2019-08-26 ENCOUNTER — Other Ambulatory Visit: Payer: Self-pay

## 2019-08-26 DIAGNOSIS — I10 Essential (primary) hypertension: Secondary | ICD-10-CM | POA: Diagnosis not present

## 2019-08-26 DIAGNOSIS — F0390 Unspecified dementia without behavioral disturbance: Secondary | ICD-10-CM | POA: Insufficient documentation

## 2019-08-26 DIAGNOSIS — R4182 Altered mental status, unspecified: Secondary | ICD-10-CM | POA: Diagnosis present

## 2019-08-26 HISTORY — DX: Unspecified dementia, unspecified severity, without behavioral disturbance, psychotic disturbance, mood disturbance, and anxiety: F03.90

## 2019-08-26 LAB — CBC WITH DIFFERENTIAL/PLATELET
Abs Immature Granulocytes: 0.04 10*3/uL (ref 0.00–0.07)
Basophils Absolute: 0 10*3/uL (ref 0.0–0.1)
Basophils Relative: 0 %
Eosinophils Absolute: 0 10*3/uL (ref 0.0–0.5)
Eosinophils Relative: 0 %
HCT: 43 % (ref 36.0–46.0)
Hemoglobin: 14 g/dL (ref 12.0–15.0)
Immature Granulocytes: 1 %
Lymphocytes Relative: 6 %
Lymphs Abs: 0.4 10*3/uL — ABNORMAL LOW (ref 0.7–4.0)
MCH: 27.7 pg (ref 26.0–34.0)
MCHC: 32.6 g/dL (ref 30.0–36.0)
MCV: 85 fL (ref 80.0–100.0)
Monocytes Absolute: 0.2 10*3/uL (ref 0.1–1.0)
Monocytes Relative: 3 %
Neutro Abs: 6.4 10*3/uL (ref 1.7–7.7)
Neutrophils Relative %: 90 %
Platelets: 194 10*3/uL (ref 150–400)
RBC: 5.06 MIL/uL (ref 3.87–5.11)
RDW: 13.5 % (ref 11.5–15.5)
WBC: 7.1 10*3/uL (ref 4.0–10.5)
nRBC: 0 % (ref 0.0–0.2)

## 2019-08-26 LAB — COMPREHENSIVE METABOLIC PANEL
ALT: 17 U/L (ref 0–44)
AST: 33 U/L (ref 15–41)
Albumin: 4.6 g/dL (ref 3.5–5.0)
Alkaline Phosphatase: 58 U/L (ref 38–126)
Anion gap: 13 (ref 5–15)
BUN: 14 mg/dL (ref 8–23)
CO2: 25 mmol/L (ref 22–32)
Calcium: 10.1 mg/dL (ref 8.9–10.3)
Chloride: 103 mmol/L (ref 98–111)
Creatinine, Ser: 0.72 mg/dL (ref 0.44–1.00)
GFR calc Af Amer: >60 mL/min (ref >60)
GFR calc non Af Amer: >60 mL/min (ref >60)
Glucose, Bld: 122 mg/dL — ABNORMAL HIGH (ref 70–99)
Potassium: 3.9 mmol/L (ref 3.5–5.1)
Sodium: 141 mmol/L (ref 135–145)
Total Bilirubin: 1.1 mg/dL (ref 0.3–1.2)
Total Protein: 7.3 g/dL (ref 6.5–8.1)

## 2019-08-26 LAB — URINALYSIS, COMPLETE (UACMP) WITH MICROSCOPIC
Bacteria, UA: NONE SEEN
Bilirubin Urine: NEGATIVE
Glucose, UA: 50 mg/dL — AB
Ketones, ur: 20 mg/dL — AB
Leukocytes,Ua: NEGATIVE
Nitrite: NEGATIVE
Protein, ur: 30 mg/dL — AB
Specific Gravity, Urine: 1.019 (ref 1.005–1.030)
Squamous Epithelial / HPF: NONE SEEN (ref 0–5)
pH: 5 (ref 5.0–8.0)

## 2019-08-26 LAB — TROPONIN I (HIGH SENSITIVITY): Troponin I (High Sensitivity): 4 ng/L (ref <18)

## 2019-08-26 MED ORDER — DONEPEZIL HCL 5 MG PO TABS: 5.0000 mg | ORAL_TABLET | Freq: Every evening | ORAL | Status: DC

## 2019-08-26 MED ORDER — ASPIRIN EC 81 MG PO TBEC: 81.0000 mg | DELAYED_RELEASE_TABLET | Freq: Every day | ORAL | Status: DC

## 2019-08-26 MED ORDER — CLOPIDOGREL BISULFATE 75 MG PO TABS: 75.0000 mg | ORAL_TABLET | Freq: Every day | ORAL | Status: DC

## 2019-08-26 MED ORDER — VITAMIN B-12 1000 MCG PO TABS: 1000.0000 ug | ORAL_TABLET | Freq: Every day | ORAL | Status: DC

## 2019-08-26 MED ORDER — ROSUVASTATIN CALCIUM 10 MG PO TABS
10.0000 mg | ORAL_TABLET | Freq: Every day | ORAL | Status: DC
  Filled 2019-08-26: qty 1

## 2019-08-26 MED ORDER — VITAMIN D3 25 MCG (1000 UNIT) PO TABS
1000.0000 [IU] | ORAL_TABLET | Freq: Every day | ORAL | Status: DC
  Filled 2019-08-26: qty 1

## 2019-08-26 MED ORDER — RISPERIDONE 1 MG PO TABS: 0.5000 mg | ORAL_TABLET | Freq: Three times a day (TID) | ORAL | Status: DC

## 2019-08-26 MED ORDER — SERTRALINE HCL 50 MG PO TABS: 75.0000 mg | ORAL_TABLET | Freq: Every day | ORAL | Status: DC

## 2019-08-26 NOTE — Discharge Instructions (Signed)
Your lab tests and CT scan were okay today. Please follow up with Dr. Doy Hutching tomorrow.

## 2019-08-26 NOTE — ED Provider Notes (Signed)
[facility] Emergency Department Provider Note       Time seen: ----------------------------------------- 1:26 PM on 08/26/2019 ----------------------------------------- Level V caveat: History/ROS limited by dementia  I have reviewed the triage vital signs and the nursing notes.  HISTORY   Chief Complaint Altered Mental Status    HPI TRE HUNKER is a 78 y.o. female with a history of dementia, depression, GERD, hyperlipidemia, hypertension, CVA who presents to the ED for altered mental status.  Patient sent here by family for altered mental status.  It is unclear how long this has been going on.  The increase to Aricept but recently decreased her Risperdal due to poor appetite according to recent paperwork.  Past Medical History:  Diagnosis Date  . Anginal pain ([place])   . Anxiety   . B12 deficiency   . Colon polyps   . CVD (cardiovascular disease)   . Depression   . Dysphagia   . Dyspnea   . Esophageal dysmotility   . Gastritis   . GERD (gastroesophageal reflux disease)   . Hemorrhoids   . HNP (herniated nucleus pulposus), lumbar   . Hyperlipidemia   . Hypertension   . Insomnia   . Lipids blood increased   . Meningioma ([place])   . Non-cardiac chest pain   . Osteoarthritis   . Polyp of cervix   . Stroke (cerebrum) [place])     Patient Active Problem List   Diagnosis Date Noted  . Severe major depression, single episode, without psychotic features ([place]) 03/11/2017  . Dementia of the Alzheimer's type ([place]) 03/11/2017    Past Surgical History:  Procedure Laterality Date  . BIOPSY SHOULDER    . CHOLECYSTECTOMY    . colonic polyps    . COLONOSCOPY    . COLONOSCOPY WITH PROPOFOL N/A 04/28/2018   Procedure: COLONOSCOPY WITH PROPOFOL;  Surgeon: Manya Silvas, MD;  Location[facility] ENDOSCOPY;  Service: Endoscopy;  Laterality: N/A;  . ESOPHAGOGASTRODUODENOSCOPY (EGD) WITH PROPOFOL N/A 07/12/2016   Procedure: ESOPHAGOGASTRODUODENOSCOPY (EGD)  WITH PROPOFOL;  Surgeon: Manya Silvas, MD;  Location: [facility] ENDOSCOPY;  Service: Endoscopy;  Laterality: N/A;  . SAVORY DILATION N/A 07/12/2016   Procedure: SAVORY DILATION;  Surgeon: Manya Silvas, MD;  Location: [facility] ENDOSCOPY;  Service: Endoscopy;  Laterality: N/A;  . TUBAL LIGATION      Allergies Norco [hydrocodone-acetaminophen]  Social History Social History   Tobacco Use  . Smoking status: Never Smoker  . Smokeless tobacco: Never Used  Substance Use Topics  . Alcohol use: No  . Drug use: No    Review of Systems Unknown, positive for altered mental status  All systems negative/normal/unremarkable except as stated in the HPI  ____________________________________________   PHYSICAL EXAM:  VITAL SIGNS: ED Triage Vitals  Enc Vitals Group     BP      Pulse      Resp      Temp      Temp src      SpO2      Weight      Height      Head Circumference      Peak Flow      Pain Score      Pain Loc      Pain Edu?      Excl. in [place]?    Constitutional: Alert but disoriented, no acute distress. Eyes: Conjunctivae are normal. Normal extraocular movements. ENT      Head: Normocephalic and atraumatic.      Nose:  No congestion/rhinnorhea.      Mouth/Throat: Mucous membranes are moist.      Neck: No stridor. Cardiovascular: Normal rate, regular rhythm. No murmurs, rubs, or gallops. Respiratory: Normal respiratory effort without tachypnea nor retractions. Breath sounds are clear and equal bilaterally. No wheezes/rales/rhonchi. Gastrointestinal: Soft and nontender. Normal bowel sounds Musculoskeletal: Nontender with normal range of motion in extremities. No lower extremity tenderness nor edema. Neurologic: Patient will not cooperate with examination, appears to be nonverbal.  No gross neurologic deficits are noted Skin:  Skin is warm, dry and intact. No rash noted. Psychiatric: Flat affect ____________________________________________  EKG: Interpreted by me.  Sinus  rhythm with a rate of 95 bpm, normal PR interval, normal QRS, possible posterior infarct that is old, normal QT  ____________________________________________  ED COURSE:  As part of my medical decision making, I reviewed the following data within the [place] History obtained from family if available, nursing notes, old chart and ekg, as well as notes from prior ED visits. Patient presented for altered mental status, we will assess with labs and imaging as indicated at this time.   Procedures  LARAINE MARGIOTTA was evaluated in Emergency Department on 08/26/2019 for the symptoms described in the history of present illness. She was evaluated in the context of the global COVID-19 pandemic, which necessitated consideration that the patient might be at risk for infection with the SARS-CoV-2 virus that causes COVID-19. Institutional protocols and algorithms that pertain to the evaluation of patients at risk for COVID-19 are in a state of rapid change based on information released by regulatory bodies including the CDC and federal and state organizations. These policies and algorithms were followed during the patient's care in the ED.  ____________________________________________   LABS (pertinent positives/negatives)  Labs Reviewed  CBC WITH DIFFERENTIAL/PLATELET - Abnormal; Notable for the following components:      Result Value   Lymphs Abs 0.4 (*)    All other components within normal limits  COMPREHENSIVE METABOLIC PANEL - Abnormal; Notable for the following components:   Glucose, Bld 122 (*)    All other components within normal limits  URINALYSIS, COMPLETE (UACMP) WITH MICROSCOPIC  TROPONIN I (HIGH SENSITIVITY)    RADIOLOGY Images were viewed by me  CT head, chest x-ray IMPRESSION: 1. No acute intracranial hemorrhage. 2. Moderate age-related atrophy and chronic microvascular ischemic changes. 3. Left frontal complexity calcified meningioma. IMPRESSION: 1.  Lower lung volumes with new vascular congestion and possible mild edema. No focal airspace disease. 2. Aortic atherosclerosis.  ____________________________________________   DIFFERENTIAL DIAGNOSIS   Dementia, dehydration, electrolyte abnormality, occult infection, CVA, MI  FINAL ASSESSMENT AND PLAN  Altered mental status   Plan: The patient had presented for altered mental status. Patient's labs to this point have been unremarkable although urinalysis is pending. Patient's imaging does not reveal any acute process.  Son is requesting that we admit her for altered mental status.   Laurence Aly, MD    Note: This note was generated in part or whole with voice recognition software. Voice recognition is usually quite accurate but there are transcription errors that can and very often do occur. I apologize for any typographical errors that were not detected and corrected.     Earleen Newport, MD 08/26/19 [phone]

## 2019-08-26 NOTE — ED Notes (Signed)
Pt's son at bedside. Pt's son states they went for a walk and everything was ok. Son states when they got back she sat down in her chair and then became unresponsive to son. Pt's son also states she was not walking her normal way and was instead taking short choppy steps. Son states normally the patient walks with a walker, goes to bathroom independently and communicates on a regular basis.

## 2019-08-26 NOTE — ED Notes (Signed)
Patient transported to CT 

## 2019-08-26 NOTE — ED Triage Notes (Signed)
Pt comes via [facility] from home with c/o AMS that started about 30 minutes ago. EMS also reports son stated she was  lethargic last night and he held all of her medications. No morning medications given per EMS.  EMS reports VSS, [id number]  Pt arrives alert. Pt not communicating at this time. MD at bedside  Pt has noticeable tremor.

## 2019-08-26 NOTE — ED Provider Notes (Signed)
Procedures     ----------------------------------------- 5:30 PM on 08/26/2019 -----------------------------------------  Urinalysis normal.  Vital signs remain unremarkable.  No evidence of any acute illness based on work-up, appears to be progression of chronic dementia.  Son wishes to take the patient home and will follow up with her PCP Dr. Doy Hutching.  In discussion with Dr. Jimmye Norman, feels this is reasonable given that this is likely all chronic and not acutely treatable.   Carrie Mew, MD 08/26/19 [id number]

## 2019-08-27 LAB — URINE CULTURE: Culture: NO GROWTH

## 2020-02-16 ENCOUNTER — Emergency Department: Payer: Medicare Other

## 2020-02-16 ENCOUNTER — Other Ambulatory Visit: Payer: Self-pay

## 2020-02-16 ENCOUNTER — Inpatient Hospital Stay (HOSPITAL_COMMUNITY)
Admission: AD | Admit: 2020-02-16 | Discharge: 2020-02-22 | DRG: 100 | Disposition: A | Discharge status: Home Health | Payer: Medicare Other | Source: Other Acute Inpatient Hospital | Attending: Internal Medicine | Admitting: Internal Medicine

## 2020-02-16 ENCOUNTER — Inpatient Hospital Stay (HOSPITAL_COMMUNITY): Payer: Medicare Other

## 2020-02-16 ENCOUNTER — Emergency Department
Admission: EM | Admit: 2020-02-16 | Discharge: 2020-02-16 | Disposition: A | Discharge status: Other Acute Inpatient Hospital | Payer: Medicare Other | Attending: Student | Admitting: Student

## 2020-02-16 ENCOUNTER — Encounter (HOSPITAL_COMMUNITY): Payer: Medicare Other

## 2020-02-16 DIAGNOSIS — Z9049 Acquired absence of other specified parts of digestive tract: Secondary | ICD-10-CM

## 2020-02-16 DIAGNOSIS — R569 Unspecified convulsions: Secondary | ICD-10-CM | POA: Diagnosis present

## 2020-02-16 DIAGNOSIS — I2510 Atherosclerotic heart disease of native coronary artery without angina pectoris: Secondary | ICD-10-CM | POA: Diagnosis present

## 2020-02-16 DIAGNOSIS — G40201 Localization-related (focal) (partial) symptomatic epilepsy and epileptic syndromes with complex partial seizures, not intractable, with status epilepticus: Principal | ICD-10-CM | POA: Diagnosis present

## 2020-02-16 DIAGNOSIS — R739 Hyperglycemia, unspecified: Secondary | ICD-10-CM | POA: Diagnosis present

## 2020-02-16 DIAGNOSIS — J9600 Acute respiratory failure, unspecified whether with hypoxia or hypercapnia: Secondary | ICD-10-CM | POA: Diagnosis present

## 2020-02-16 DIAGNOSIS — Z20822 Contact with and (suspected) exposure to covid-19: Secondary | ICD-10-CM | POA: Diagnosis present

## 2020-02-16 DIAGNOSIS — I110 Hypertensive heart disease with heart failure: Secondary | ICD-10-CM | POA: Diagnosis present

## 2020-02-16 DIAGNOSIS — R7989 Other specified abnormal findings of blood chemistry: Secondary | ICD-10-CM | POA: Diagnosis present

## 2020-02-16 DIAGNOSIS — E785 Hyperlipidemia, unspecified: Secondary | ICD-10-CM | POA: Diagnosis present

## 2020-02-16 DIAGNOSIS — Z8673 Personal history of transient ischemic attack (TIA), and cerebral infarction without residual deficits: Secondary | ICD-10-CM

## 2020-02-16 DIAGNOSIS — F419 Anxiety disorder, unspecified: Secondary | ICD-10-CM | POA: Diagnosis present

## 2020-02-16 DIAGNOSIS — E872 Acidosis: Secondary | ICD-10-CM | POA: Diagnosis present

## 2020-02-16 DIAGNOSIS — D72829 Elevated white blood cell count, unspecified: Secondary | ICD-10-CM | POA: Diagnosis present

## 2020-02-16 DIAGNOSIS — E876 Hypokalemia: Secondary | ICD-10-CM | POA: Diagnosis present

## 2020-02-16 DIAGNOSIS — F0390 Unspecified dementia without behavioral disturbance: Secondary | ICD-10-CM | POA: Diagnosis present

## 2020-02-16 DIAGNOSIS — K219 Gastro-esophageal reflux disease without esophagitis: Secondary | ICD-10-CM | POA: Diagnosis present

## 2020-02-16 DIAGNOSIS — R579 Shock, unspecified: Secondary | ICD-10-CM | POA: Diagnosis present

## 2020-02-16 DIAGNOSIS — F418 Other specified anxiety disorders: Secondary | ICD-10-CM | POA: Diagnosis present

## 2020-02-16 DIAGNOSIS — F320 Major depressive disorder, single episode, mild: Secondary | ICD-10-CM | POA: Diagnosis not present

## 2020-02-16 DIAGNOSIS — G4700 Insomnia, unspecified: Secondary | ICD-10-CM | POA: Diagnosis present

## 2020-02-16 DIAGNOSIS — D329 Benign neoplasm of meninges, unspecified: Secondary | ICD-10-CM | POA: Diagnosis present

## 2020-02-16 DIAGNOSIS — E7800 Pure hypercholesterolemia, unspecified: Secondary | ICD-10-CM | POA: Diagnosis not present

## 2020-02-16 DIAGNOSIS — E538 Deficiency of other specified B group vitamins: Secondary | ICD-10-CM | POA: Diagnosis present

## 2020-02-16 DIAGNOSIS — F32A Depression, unspecified: Secondary | ICD-10-CM | POA: Diagnosis present

## 2020-02-16 DIAGNOSIS — Z7901 Long term (current) use of anticoagulants: Secondary | ICD-10-CM | POA: Diagnosis not present

## 2020-02-16 DIAGNOSIS — Z7902 Long term (current) use of antithrombotics/antiplatelets: Secondary | ICD-10-CM

## 2020-02-16 DIAGNOSIS — I5032 Chronic diastolic (congestive) heart failure: Secondary | ICD-10-CM | POA: Diagnosis present

## 2020-02-16 DIAGNOSIS — G936 Cerebral edema: Secondary | ICD-10-CM | POA: Diagnosis present

## 2020-02-16 DIAGNOSIS — R4182 Altered mental status, unspecified: Secondary | ICD-10-CM | POA: Insufficient documentation

## 2020-02-16 DIAGNOSIS — I2489 Other forms of acute ischemic heart disease: Secondary | ICD-10-CM | POA: Diagnosis present

## 2020-02-16 DIAGNOSIS — I10 Essential (primary) hypertension: Secondary | ICD-10-CM | POA: Insufficient documentation

## 2020-02-16 DIAGNOSIS — J988 Other specified respiratory disorders: Secondary | ICD-10-CM

## 2020-02-16 DIAGNOSIS — D320 Benign neoplasm of cerebral meninges: Secondary | ICD-10-CM | POA: Diagnosis present

## 2020-02-16 DIAGNOSIS — Z7982 Long term (current) use of aspirin: Secondary | ICD-10-CM

## 2020-02-16 DIAGNOSIS — G40901 Epilepsy, unspecified, not intractable, with status epilepticus: Secondary | ICD-10-CM | POA: Diagnosis not present

## 2020-02-16 DIAGNOSIS — Z79899 Other long term (current) drug therapy: Secondary | ICD-10-CM

## 2020-02-16 DIAGNOSIS — I248 Other forms of acute ischemic heart disease: Secondary | ICD-10-CM | POA: Diagnosis present

## 2020-02-16 DIAGNOSIS — R509 Fever, unspecified: Secondary | ICD-10-CM | POA: Diagnosis not present

## 2020-02-16 DIAGNOSIS — I361 Nonrheumatic tricuspid (valve) insufficiency: Secondary | ICD-10-CM | POA: Diagnosis not present

## 2020-02-16 DIAGNOSIS — R778 Other specified abnormalities of plasma proteins: Secondary | ICD-10-CM | POA: Diagnosis not present

## 2020-02-16 DIAGNOSIS — F329 Major depressive disorder, single episode, unspecified: Secondary | ICD-10-CM | POA: Diagnosis present

## 2020-02-16 DIAGNOSIS — Z9911 Dependence on respirator [ventilator] status: Principal | ICD-10-CM

## 2020-02-16 LAB — BLOOD GAS, ARTERIAL
Acid-base deficit: 6.2 mmol/L — ABNORMAL HIGH (ref 0.0–2.0)
Bicarbonate: 17.7 mmol/L — ABNORMAL LOW (ref 20.0–28.0)
FIO2: 1
MECHVT: 450 mL
O2 Saturation: 99.9 %
PEEP: 5 cmH2O
Patient temperature: 37
RATE: 22 resp/min
pCO2 arterial: 30 mmHg — ABNORMAL LOW (ref 32.0–48.0)
pH, Arterial: 7.38 (ref 7.350–7.450)
pO2, Arterial: 295 mmHg — ABNORMAL HIGH (ref 83.0–108.0)

## 2020-02-16 LAB — CBC WITH DIFFERENTIAL/PLATELET
Abs Immature Granulocytes: 0.11 10*3/uL — ABNORMAL HIGH (ref 0.00–0.07)
Basophils Absolute: 0.1 10*3/uL (ref 0.0–0.1)
Basophils Relative: 1 %
Eosinophils Absolute: 0 10*3/uL (ref 0.0–0.5)
Eosinophils Relative: 0 %
HCT: 44.3 % (ref 36.0–46.0)
Hemoglobin: 14.1 g/dL (ref 12.0–15.0)
Immature Granulocytes: 1 %
Lymphocytes Relative: 25 %
Lymphs Abs: 3.8 10*3/uL (ref 0.7–4.0)
MCH: 27.8 pg (ref 26.0–34.0)
MCHC: 31.8 g/dL (ref 30.0–36.0)
MCV: 87.4 fL (ref 80.0–100.0)
Monocytes Absolute: 0.4 10*3/uL (ref 0.1–1.0)
Monocytes Relative: 3 %
Neutro Abs: 10.7 10*3/uL — ABNORMAL HIGH (ref 1.7–7.7)
Neutrophils Relative %: 70 %
Platelets: 296 10*3/uL (ref 150–400)
RBC: 5.07 MIL/uL (ref 3.87–5.11)
RDW: 13 % (ref 11.5–15.5)
WBC: 15.1 10*3/uL — ABNORMAL HIGH (ref 4.0–10.5)
nRBC: 0 % (ref 0.0–0.2)

## 2020-02-16 LAB — CSF CELL COUNT WITH DIFFERENTIAL
Eosinophils, CSF: 0 %
Eosinophils, CSF: 0 %
Lymphs, CSF: 0 %
Lymphs, CSF: 0 %
Monocyte-Macrophage-Spinal Fluid: 0 %
Monocyte-Macrophage-Spinal Fluid: 0 %
RBC Count, CSF: 0 /mm3 (ref 0–3)
RBC Count, CSF: 0 /mm3 (ref 0–3)
Segmented Neutrophils-CSF: 0 %
Segmented Neutrophils-CSF: 0 %
Tube #: 1
Tube #: 3
WBC, CSF: 0 /mm3 (ref 0–5)
WBC, CSF: 0 /mm3 (ref 0–5)

## 2020-02-16 LAB — URINALYSIS, COMPLETE (UACMP) WITH MICROSCOPIC
Bilirubin Urine: NEGATIVE
Glucose, UA: NEGATIVE mg/dL
Ketones, ur: 5 mg/dL — AB
Leukocytes,Ua: NEGATIVE
Nitrite: NEGATIVE
Protein, ur: 100 mg/dL — AB
RBC / HPF: >50 RBC/hpf — ABNORMAL HIGH (ref 0–5)
Specific Gravity, Urine: 1.012 (ref 1.005–1.030)
pH: 5 (ref 5.0–8.0)

## 2020-02-16 LAB — LACTIC ACID, PLASMA
Lactic Acid, Venous: >11 mmol/L (ref 0.5–1.9)
Lactic Acid, Venous: 3.6 mmol/L (ref 0.5–1.9)

## 2020-02-16 LAB — GLUCOSE, CAPILLARY
Glucose-Capillary: 182 mg/dL — ABNORMAL HIGH (ref 70–99)
Glucose-Capillary: 120 mg/dL — ABNORMAL HIGH (ref 70–99)
Glucose-Capillary: 121 mg/dL — ABNORMAL HIGH (ref 70–99)

## 2020-02-16 LAB — COMPREHENSIVE METABOLIC PANEL
ALT: 15 U/L (ref 0–44)
AST: 27 U/L (ref 15–41)
Albumin: 4.6 g/dL (ref 3.5–5.0)
Alkaline Phosphatase: 76 U/L (ref 38–126)
Anion gap: 22 — ABNORMAL HIGH (ref 5–15)
BUN: 12 mg/dL (ref 8–23)
CO2: 13 mmol/L — ABNORMAL LOW (ref 22–32)
Calcium: 9.2 mg/dL (ref 8.9–10.3)
Chloride: 101 mmol/L (ref 98–111)
Creatinine, Ser: 0.99 mg/dL (ref 0.44–1.00)
GFR calc Af Amer: >60 mL/min (ref >60)
GFR calc non Af Amer: 54 mL/min — ABNORMAL LOW (ref >60)
Glucose, Bld: 181 mg/dL — ABNORMAL HIGH (ref 70–99)
Potassium: 3.2 mmol/L — ABNORMAL LOW (ref 3.5–5.1)
Sodium: 136 mmol/L (ref 135–145)
Total Bilirubin: 0.9 mg/dL (ref 0.3–1.2)
Total Protein: 7.6 g/dL (ref 6.5–8.1)

## 2020-02-16 LAB — PROTIME-INR
INR: 1.1 (ref 0.8–1.2)
Prothrombin Time: 13.7 seconds (ref 11.4–15.2)

## 2020-02-16 LAB — RESPIRATORY PANEL BY RT PCR (FLU A&B, COVID)
Influenza A by PCR: NEGATIVE
Influenza B by PCR: NEGATIVE
SARS Coronavirus 2 by RT PCR: NEGATIVE

## 2020-02-16 LAB — TROPONIN I (HIGH SENSITIVITY)
Troponin I (High Sensitivity): 38 ng/L — ABNORMAL HIGH (ref <18)
Troponin I (High Sensitivity): 940 ng/L (ref <18)

## 2020-02-16 LAB — APTT: aPTT: <24 seconds — ABNORMAL LOW (ref 24–36)

## 2020-02-16 LAB — MRSA PCR SCREENING: MRSA by PCR: NEGATIVE

## 2020-02-16 LAB — CK: Total CK: 40 U/L (ref 38–234)

## 2020-02-16 LAB — PROTEIN AND GLUCOSE, CSF
Glucose, CSF: 80 mg/dL — ABNORMAL HIGH (ref 40–70)
Total  Protein, CSF: 71 mg/dL — ABNORMAL HIGH (ref 15–45)

## 2020-02-16 LAB — PROCALCITONIN: Procalcitonin: <0.1 ng/mL

## 2020-02-16 MED ORDER — LORAZEPAM 2 MG/ML IJ SOLN
INTRAMUSCULAR | Status: AC
  Filled 2020-02-16: qty 1

## 2020-02-16 MED ORDER — SODIUM CHLORIDE 0.9 % IV BOLUS
1000.0000 mL | Freq: Once | INTRAVENOUS | Status: AC
  Administered 2020-02-16: 1000 mL via INTRAVENOUS

## 2020-02-16 MED ORDER — ROCURONIUM BROMIDE 50 MG/5ML IV SOLN
100.0000 mg | Freq: Once | INTRAVENOUS | Status: AC
  Administered 2020-02-16: 14:00:00 100 mg via INTRAVENOUS
  Filled 2020-02-16: qty 10

## 2020-02-16 MED ORDER — PROPOFOL 1000 MG/100ML IV EMUL
5.0000 ug/kg/min | INTRAVENOUS | Status: DC
  Administered 2020-02-16: 16:00:00 15 ug/kg/min via INTRAVENOUS
  Administered 2020-02-16: 15:00:00 10 ug/kg/min via INTRAVENOUS
  Filled 2020-02-16: qty 100

## 2020-02-16 MED ORDER — HEPARIN SODIUM (PORCINE) 5000 UNIT/ML IJ SOLN: 5000.0000 [IU] | Freq: Three times a day (TID) | INTRAMUSCULAR | Status: DC

## 2020-02-16 MED ORDER — FENTANYL CITRATE (PF) 100 MCG/2ML IJ SOLN
25.0000 ug | INTRAMUSCULAR | Status: DC | PRN
  Filled 2020-02-16: qty 2

## 2020-02-16 MED ORDER — SODIUM CHLORIDE 0.9 % IV BOLUS
500.0000 mL | Freq: Once | INTRAVENOUS | Status: AC
  Administered 2020-02-17: 500 mL via INTRAVENOUS

## 2020-02-16 MED ORDER — DOCUSATE SODIUM 100 MG PO CAPS: 100.0000 mg | ORAL_CAPSULE | Freq: Two times a day (BID) | ORAL | Status: DC | PRN

## 2020-02-16 MED ORDER — ACETAMINOPHEN 325 MG PO TABS
650.0000 mg | ORAL_TABLET | Freq: Four times a day (QID) | ORAL | Status: DC | PRN
  Administered 2020-02-16 – 2020-02-18 (×5): 650 mg
  Filled 2020-02-16 (×5): qty 2

## 2020-02-16 MED ORDER — INSULIN ASPART 100 UNIT/ML ~~LOC~~ SOLN
0.0000 [IU] | SUBCUTANEOUS | Status: DC
  Administered 2020-02-17 – 2020-02-21 (×2): 2 [IU] via SUBCUTANEOUS

## 2020-02-16 MED ORDER — LACTATED RINGERS IV SOLN: INTRAVENOUS | Status: DC

## 2020-02-16 MED ORDER — POTASSIUM CHLORIDE 20 MEQ/15ML (10%) PO SOLN
40.0000 meq | Freq: Once | ORAL | Status: AC
  Administered 2020-02-16: 40 meq
  Filled 2020-02-16: qty 30

## 2020-02-16 MED ORDER — FENTANYL CITRATE (PF) 100 MCG/2ML IJ SOLN
25.0000 ug | INTRAMUSCULAR | Status: DC | PRN
  Administered 2020-02-17: 100 ug via INTRAVENOUS
  Administered 2020-02-18: 50 ug via INTRAVENOUS
  Filled 2020-02-16 (×2): qty 2

## 2020-02-16 MED ORDER — ACETAMINOPHEN 10 MG/ML IV SOLN
1000.0000 mg | INTRAVENOUS | Status: AC
  Administered 2020-02-16: 17:00:00 1000 mg via INTRAVENOUS
  Filled 2020-02-16: qty 100

## 2020-02-16 MED ORDER — DOCUSATE SODIUM 50 MG/5ML PO LIQD
100.0000 mg | Freq: Two times a day (BID) | ORAL | Status: DC
  Administered 2020-02-17 (×2): 100 mg
  Filled 2020-02-16 (×2): qty 10

## 2020-02-16 MED ORDER — POLYETHYLENE GLYCOL 3350 17 G PO PACK
17.0000 g | PACK | Freq: Every day | ORAL | Status: DC
  Filled 2020-02-16: qty 1

## 2020-02-16 MED ORDER — ACETAMINOPHEN 650 MG RE SUPP: 650.0000 mg | Freq: Once | RECTAL | Status: DC

## 2020-02-16 MED ORDER — DOCUSATE SODIUM 50 MG/5ML PO LIQD
100.0000 mg | Freq: Two times a day (BID) | ORAL | Status: DC
  Filled 2020-02-16: qty 10

## 2020-02-16 MED ORDER — PANTOPRAZOLE SODIUM 40 MG IV SOLR
40.0000 mg | Freq: Every day | INTRAVENOUS | Status: DC
  Administered 2020-02-16 – 2020-02-17 (×2): 40 mg via INTRAVENOUS
  Filled 2020-02-16 (×2): qty 40

## 2020-02-16 MED ORDER — ORAL CARE MOUTH RINSE
15.0000 mL | OROMUCOSAL | Status: DC
  Administered 2020-02-16 – 2020-02-18 (×18): 15 mL via OROMUCOSAL

## 2020-02-16 MED ORDER — LORAZEPAM 2 MG/ML IJ SOLN
2.0000 mg | Freq: Once | INTRAMUSCULAR | Status: AC
  Administered 2020-02-16: 2 mg via INTRAVENOUS

## 2020-02-16 MED ORDER — SODIUM CHLORIDE 0.9 % IV SOLN
2.0000 g | Freq: Once | INTRAVENOUS | Status: AC
  Administered 2020-02-16: 15:00:00 2 g via INTRAVENOUS
  Filled 2020-02-16: qty 20

## 2020-02-16 MED ORDER — SODIUM CHLORIDE 0.9 % IV BOLUS
1000.0000 mL | Freq: Once | INTRAVENOUS | Status: AC
  Administered 2020-02-16: 15:00:00 1000 mL via INTRAVENOUS

## 2020-02-16 MED ORDER — MIDAZOLAM HCL 2 MG/2ML IJ SOLN: 1.0000 mg | INTRAMUSCULAR | Status: DC | PRN

## 2020-02-16 MED ORDER — ETOMIDATE 2 MG/ML IV SOLN
20.0000 mg | Freq: Once | INTRAVENOUS | Status: AC
  Administered 2020-02-16: 14:00:00 20 mg via INTRAVENOUS

## 2020-02-16 MED ORDER — LORAZEPAM 2 MG/ML IJ SOLN
INTRAMUSCULAR | Status: AC
  Administered 2020-02-16: 16:00:00 2 mg
  Filled 2020-02-16: qty 1

## 2020-02-16 MED ORDER — ASPIRIN 81 MG PO CHEW
81.0000 mg | CHEWABLE_TABLET | Freq: Every day | ORAL | Status: DC
  Administered 2020-02-16 – 2020-02-17 (×2): 81 mg
  Filled 2020-02-16: qty 1

## 2020-02-16 MED ORDER — FENTANYL CITRATE (PF) 100 MCG/2ML IJ SOLN
INTRAMUSCULAR | Status: AC
  Filled 2020-02-16: qty 2

## 2020-02-16 MED ORDER — CHLORHEXIDINE GLUCONATE CLOTH 2 % EX PADS
6.0000 | MEDICATED_PAD | Freq: Every day | CUTANEOUS | Status: DC
  Administered 2020-02-16 – 2020-02-21 (×6): 6 via TOPICAL

## 2020-02-16 MED ORDER — LORAZEPAM 2 MG/ML IJ SOLN
2.0000 mg | Freq: Once | INTRAMUSCULAR | Status: AC
  Administered 2020-02-16: 14:00:00 2 mg via INTRAVENOUS

## 2020-02-16 MED ORDER — LORAZEPAM 2 MG/ML IJ SOLN
2.0000 mg | Freq: Once | INTRAMUSCULAR | Status: AC
  Administered 2020-02-16: 18:00:00 2 mg via INTRAVENOUS
  Filled 2020-02-16: qty 1

## 2020-02-16 MED ORDER — LEVETIRACETAM IN NACL 1000 MG/100ML IV SOLN
1000.0000 mg | Freq: Once | INTRAVENOUS | Status: AC
  Administered 2020-02-16: 16:00:00 1000 mg via INTRAVENOUS

## 2020-02-16 MED ORDER — PHENYLEPHRINE HCL-NACL 10-0.9 MG/250ML-% IV SOLN
0.0000 ug/min | INTRAVENOUS | Status: DC
  Administered 2020-02-17: 60 ug/min via INTRAVENOUS
  Administered 2020-02-17: 10 ug/min via INTRAVENOUS
  Administered 2020-02-17: 50 ug/min via INTRAVENOUS
  Administered 2020-02-17 (×2): 40 ug/min via INTRAVENOUS
  Filled 2020-02-16 (×6): qty 250

## 2020-02-16 MED ORDER — CLOPIDOGREL BISULFATE 75 MG PO TABS
75.0000 mg | ORAL_TABLET | Freq: Every day | ORAL | Status: DC
  Administered 2020-02-16 – 2020-02-17 (×2): 75 mg
  Filled 2020-02-16 (×2): qty 1

## 2020-02-16 MED ORDER — POLYETHYLENE GLYCOL 3350 17 G PO PACK: 17.0000 g | PACK | Freq: Every day | ORAL | Status: DC | PRN

## 2020-02-16 MED ORDER — ASPIRIN 300 MG RE SUPP: 600.0000 mg | Freq: Once | RECTAL | Status: DC

## 2020-02-16 MED ORDER — FENTANYL CITRATE (PF) 100 MCG/2ML IJ SOLN
100.0000 ug | Freq: Once | INTRAMUSCULAR | Status: AC
  Administered 2020-02-16: 17:00:00 100 ug via INTRAVENOUS
  Filled 2020-02-16: qty 2

## 2020-02-16 MED ORDER — ASPIRIN 81 MG PO CHEW
81.0000 mg | CHEWABLE_TABLET | Freq: Every day | ORAL | Status: DC
  Filled 2020-02-16: qty 1

## 2020-02-16 MED ORDER — SODIUM CHLORIDE 0.9 % IV BOLUS
1000.0000 mL | Freq: Once | INTRAVENOUS | Status: AC
  Administered 2020-02-16: 18:00:00 1000 mL via INTRAVENOUS

## 2020-02-16 MED ORDER — DEXTROSE 5 % IV SOLN
10.0000 mg/kg | Freq: Once | INTRAVENOUS | Status: AC
  Administered 2020-02-16: 15:00:00 580 mg via INTRAVENOUS
  Filled 2020-02-16: qty 11.6

## 2020-02-16 MED ORDER — VANCOMYCIN HCL IN DEXTROSE 1-5 GM/200ML-% IV SOLN
1000.0000 mg | Freq: Once | INTRAVENOUS | Status: AC
  Administered 2020-02-16: 1000 mg via INTRAVENOUS
  Filled 2020-02-16: qty 200

## 2020-02-16 MED ORDER — LEVETIRACETAM IN NACL 1000 MG/100ML IV SOLN
1000.0000 mg | Freq: Two times a day (BID) | INTRAVENOUS | Status: DC
  Administered 2020-02-16 – 2020-02-22 (×12): 1000 mg via INTRAVENOUS
  Filled 2020-02-16 (×13): qty 100

## 2020-02-16 MED ORDER — ACETAMINOPHEN 650 MG RE SUPP
650.0000 mg | Freq: Once | RECTAL | Status: AC
  Administered 2020-02-16: 14:00:00 650 mg via RECTAL

## 2020-02-16 MED ORDER — ROSUVASTATIN CALCIUM 5 MG PO TABS
10.0000 mg | ORAL_TABLET | Freq: Every day | ORAL | Status: DC
  Administered 2020-02-16 – 2020-02-17 (×2): 10 mg
  Filled 2020-02-16 (×2): qty 2

## 2020-02-16 MED ORDER — CHLORHEXIDINE GLUCONATE 0.12% ORAL RINSE (MEDLINE KIT)
15.0000 mL | Freq: Two times a day (BID) | OROMUCOSAL | Status: DC
  Administered 2020-02-16 – 2020-02-18 (×4): 15 mL via OROMUCOSAL

## 2020-02-16 MED ORDER — FENTANYL CITRATE (PF) 100 MCG/2ML IJ SOLN
50.0000 ug | Freq: Once | INTRAMUSCULAR | Status: AC
  Administered 2020-02-16: 50 ug via INTRAVENOUS

## 2020-02-16 NOTE — Progress Notes (Signed)
Neurology at bedside.

## 2020-02-16 NOTE — ED Notes (Signed)
Pt contracting arms inward, L arm shaking more than R. Feet shaking some. Jaw clenched. Oral suction provided at this time. meds given.

## 2020-02-16 NOTE — ED Notes (Signed)
Dr. Joan Mayans at bedside to perform LP.

## 2020-02-16 NOTE — ED Provider Notes (Signed)
[facility] Emergency Department Provider Note  ____________________________________________   First MD Initiated Contact with Patient 02/16/20 1408     (approximate)  I have reviewed the triage vital signs and the nursing notes.  History  Chief Complaint Seizures    HPI Mallory Peters is a 79 y.o. female presents to the emergency department for altered mental status and seizure.  History limited as patient is nonresponsive on arrival to the emergency department.   Per EMS report, son activated EMS due to seizure activity.  EMS states patient had been exhibiting tonic-clonic seizure activity prior to their arrival for approximately 7 minutes.  They administered 2 mg of Versed.  Generalized body shaking did stop after administration, however she continues to have her eyes deviated to the right and is nonresponsive to both painful and verbal stimuli.  Per son via phone, patient has otherwise been at her baseline.  She did have a few episodes of vomiting several days ago, but yesterday was at her baseline.  No recent illnesses or infections.  Has been fully vaccinated against COVID.  No history of seizures.  No trauma.  Patient does take nighttime Xanax for sleep, did not take this last night.  Son otherwise denies any missed medications or new medications.  Caveat: History limited due to patient's altered mental status.   Past Medical Hx Past Medical History:  Diagnosis Date  . Anginal pain ([place])   . Anxiety   . B12 deficiency   . Colon polyps   . CVD (cardiovascular disease)   . Dementia ([place])   . Depression   . Dysphagia   . Dyspnea   . Esophageal dysmotility   . Gastritis   . GERD (gastroesophageal reflux disease)   . Hemorrhoids   . HNP (herniated nucleus pulposus), lumbar   . Hyperlipidemia   . Hypertension   . Insomnia   . Lipids blood increased   . Meningioma ([place])   . Non-cardiac chest pain   . Osteoarthritis   . Polyp of  cervix   . Stroke (cerebrum) [facility])     Problem List Patient Active Problem List   Diagnosis Date Noted  . Severe major depression, single episode, without psychotic features ([place]) 03/11/2017  . Dementia of the Alzheimer's type ([place]) 03/11/2017    Past Surgical Hx Past Surgical History:  Procedure Laterality Date  . BIOPSY SHOULDER    . CHOLECYSTECTOMY    . colonic polyps    . COLONOSCOPY    . COLONOSCOPY WITH PROPOFOL N/A 04/28/2018   Procedure: COLONOSCOPY WITH PROPOFOL;  Surgeon: Manya Silvas, MD;  Location: [facility] ENDOSCOPY;  Service: Endoscopy;  Laterality: N/A;  . ESOPHAGOGASTRODUODENOSCOPY (EGD) WITH PROPOFOL N/A 07/12/2016   Procedure: ESOPHAGOGASTRODUODENOSCOPY (EGD) WITH PROPOFOL;  Surgeon: Manya Silvas, MD;  Location: [facility] ENDOSCOPY;  Service: Endoscopy;  Laterality: N/A;  . SAVORY DILATION N/A 07/12/2016   Procedure: SAVORY DILATION;  Surgeon: Manya Silvas, MD;  Location: [facility] ENDOSCOPY;  Service: Endoscopy;  Laterality: N/A;  . TUBAL LIGATION      Medications Prior to Admission medications   Medication Sig Start Date End Date Taking? Authorizing Provider  ALPRAZolam Duanne Moron) 0.25 MG tablet Take 0.25 mg by mouth at bedtime as needed for anxiety or sleep.     [provider]  aspirin EC 81 MG tablet Take 81 mg by mouth daily.    [provider]  Cholecalciferol (VITAMIN D3) 1000 units CAPS Take 1,000 Units by mouth daily.  [provider]  clopidogrel (PLAVIX) 75 MG tablet Take 75 mg by mouth daily.    [provider]  donepezil (ARICEPT) 5 MG tablet Take 5 mg by mouth every evening.  08/11/19   [provider]  risperiDONE (RISPERDAL) 0.5 MG tablet Take 0.5 mg by mouth 3 (three) times daily.    [provider]  rosuvastatin (CRESTOR) 10 MG tablet Take 10 mg by mouth daily.    [provider]  sertraline (ZOLOFT) 25 MG tablet Take 75 mg by mouth daily.     [provider]  vitamin B-12  (CYANOCOBALAMIN) 1000 MCG tablet Take 1,000 mcg by mouth daily.    [provider]    Allergies Norco [hydrocodone-acetaminophen]  Family Hx No family history on file.  Social Hx Social History   Tobacco Use  . Smoking status: Never Smoker  . Smokeless tobacco: Never Used  Substance Use Topics  . Alcohol use: No  . Drug use: No     Review of Systems Unable to obtain due to patient's clinical status.   Physical Exam  Vital Signs: ED Triage Vitals [02/16/20 1359]  Enc Vitals Group     BP (!) 150/88     Pulse Rate (!) 150     Resp 18     Temp (!) 101.7 F (38.7 C)     Temp Source Rectal     SpO2 96 %     Weight      Height      Head Circumference      Peak Flow      Pain Score      Pain Loc      Pain Edu?      Excl. in [place]?     Constitutional: Unresponsive to verbal and painful stimuli.  Eyes deviated to the right.  Facial twitching. Head: Normocephalic. Atraumatic. Eyes: Conjunctivae clear. Sclera anicteric. Pupils equal and symmetric. Nose: No masses or lesions. No congestion or rhinorrhea. Mouth/Throat: Dried blood in oropharynx, bite mark to inner lower lip. Neck: No stridor. Trachea midline.  Cardiovascular: Tachycardic, regular rhythm. Extremities well perfused. Respiratory: Normal respiratory rate.  Coarse lung sounds bilaterally.  Placed on NRB for supportive care, 100% on this. Gastrointestinal: Soft. Non-distended. Non-tender.  Genitourinary: Deferred. Musculoskeletal: No lower extremity edema. No deformities. Neurologic: Eyes deviated to the right with seemingly horizontal beating nystagmus.  Facial twitching.  Unresponsive to verbal and painful stimuli. Skin: Skin is warm to touch.  Febrile. Psychiatric: Unable to assess due to clinical status.  EKG  Personally reviewed and interpreted by myself.   Date: 02/16/20 Time: 1415 Rate: 133 Rhythm: sinus Axis: normal, borderline right Intervals: WNL Diffuse ST depressions, mild aVR  elevation, likely global ischemia/demand    Radiology  Personally reviewed available imaging myself.   CT head - IMPRESSION:  1. No evidence of acute intracranial hemorrhage or acute infarct.  2. Stable 1.2 cm left frontal convexity meningioma. There may be  trace vasogenic edema within the underlying left frontal lobe.  3. Stable mild generalized parenchymal atrophy and mild-to-moderate  chronic small vessel ischemic disease.  4. Mild ethmoid sinus mucosal thickening.   CXR - IMPRESSION:  Tube positions as described without pneumothorax. Lungs clear.  Cardiac silhouette normal. Aortic Atherosclerosis (ICD10-I70.0).    Procedures  Procedure(s) performed (including critical care):  Procedure Name: Intubation Date/Time: 02/16/2020 2:47 PM Performed by: Lilia Pro., MD Pre-anesthesia Checklist: Patient identified, Patient being monitored, Emergency Drugs available, Timeout performed and Suction available Oxygen  Delivery Method: Non-rebreather mask Preoxygenation: Pre-oxygenation with 100% oxygen Induction Type: Rapid sequence Ventilation: Mask ventilation without difficulty Laryngoscope Size: Glidescope Tube size: 8.0 mm Number of attempts: 1 Placement Confirmation: ETT inserted through vocal cords under direct vision,  CO2 detector,  Breath sounds checked- equal and bilateral and Positive ETCO2     .Critical Care Performed by: Lilia Pro., MD Authorized by: Lilia Pro., MD   Critical care provider statement:    Critical care time (minutes):  45   Critical care was time spent personally by me on the following activities:  Discussions with consultants, evaluation of patient's response to treatment, examination of patient, ordering and performing treatments and interventions, ordering and review of laboratory studies, ordering and review of radiographic studies, pulse oximetry, re-evaluation of patient's condition, obtaining history from patient or surrogate and  review of old charts .Lumbar Puncture  Date/Time: 02/16/2020 4:31 PM Performed by: Lilia Pro., MD Authorized by: Lilia Pro., MD   Consent:    Consent obtained:  Verbal   Consent given by: son.   Risks discussed:  Bleeding, infection, pain and repeat procedure Pre-procedure details:    Procedure purpose:  Diagnostic   Preparation: Patient was prepped and draped in usual sterile fashion   Sedation:    Sedation type: intubated on propofol. Anesthesia (see MAR for exact dosages):    Anesthesia method:  Local infiltration   Local anesthetic:  Lidocaine 1% w/o epi Procedure details:    Lumbar space:  L4-L5 interspace   Patient position:  L lateral decubitus   Needle gauge:  20   Needle type:  Spinal needle - Quincke tip   Needle length (in):  3.5   Number of attempts:  3   Fluid appearance:  Clear   Tubes of fluid:  4 Post-procedure:    Puncture site:  Adhesive bandage applied   Patient tolerance of procedure:  Tolerated well, no immediate complications     Initial Impression / Assessment and Plan / MDM / ED Course  79 y.o. female who presents to the ED for new onset seizures, fever, altered mental status  Ddx: intracranial etiology - CVA vs bleed vs mass vs other, meningitis, UTI or other infection with encephalopathy, electrolyte abnormality, COVID  On her arrival, she no longer has tonic-clonic seizing activity per EMS, but her eyes remain deviated to the right with facial twitching, concerning for continued seizure activity.  She is not responsive to verbal or painful stimuli.  Trialed an additional 2 mg of Ativan without any improvement in her mental status.  Able to reach the son via phone and updated him on the patient's clinical status.  He consents to full evaluation, including labs, antibiotics, LP.  Discussed the need for intubation given her continued altered mental status in order to protect her airway, he is in agreement with this as well.  Clinical  Course as of Feb 16 1639  Tue Feb 16, 2020  1609 Labs notable for WBC 15. Lactic >11, likely in the setting of prolonged seizure +/- infection. UA not convincing for infection. COVID negative. Receiving fluids and antibiotics and will recheck lactic. HR and temperature down trending with antipyretics and fluids.   CT scan with no acute intracranial hemorrhage or infarct. Stable meningioma with debatable trace vasogenic edema in left frontal. No midline shift, no obstructive hydrocephalus, no posterior fossa mass - feel it is appropriate to proceed with LP.   LP performed, CSF studies sent. Patient will require transfer for  continuous EEG capability, which we do not have here. Son requests [facility].    [SM]  [id number] Concern for continued seizure like activity (jaw and extremity clenching) - given additional dose of Ativan and loaded with 1 g Keppra.    [SM]  [id number] Patient care transferred to oncoming provider due to change of shift. Pending transfer.    [SM]    Clinical Course User Index [SM] Lilia Pro., MD      _______________________________   As part of my medical decision making I have reviewed available labs, radiology tests, reviewed old records/performed chart review, obtained additional history from family, and discussed with consultants (Neurology - Dr. Doy Mince confirms we do not have cEEG here).     Final Clinical Impression(s) / ED Diagnosis  Final diagnoses:  Seizure ([place])  Altered mental status, unspecified altered mental status type  Fever in adult       Note:  This document was prepared using Dragon voice recognition software and may include unintentional dictation errors.   Lilia Pro., MD 02/16/20 1640

## 2020-02-16 NOTE — Progress Notes (Signed)
PHARMACY -  BRIEF ANTIBIOTIC NOTE   Pharmacy has received consult(s) for vancomycin from an ED provider.  The patient's profile has been reviewed for ht/wt/allergies/indication/available labs.    One time order(s) placed for vancomycin 1 g  Further antibiotics/pharmacy consults should be ordered by admitting physician if indicated.                       Thank you,  Tawnya Crook, PharmD 02/16/2020  2:14 PM

## 2020-02-16 NOTE — ED Notes (Signed)
Date and time results received: 02/16/20 1726   Test: troponin Critical Value: 940  Name of Provider Notified: Dr. Joni Fears

## 2020-02-16 NOTE — ED Notes (Signed)
Date and time results received: 02/16/20 1715   Test: lactic acid Critical Value: 3.6  Name of Provider Notified: Dr. Joni Fears

## 2020-02-16 NOTE — ED Triage Notes (Signed)
Pt arrives to ER via [facility] from home for 20 minute long seizure. Pt arrives R sided gaze, unable to follow commands, blood noted to mouth. EMS stated FD suctioned pt. Pt tachy, hot to touch. EMS reports full body seizure, gave 2mg  versed IV. Pt CBG 130.

## 2020-02-16 NOTE — ED Provider Notes (Signed)
Procedures  Clinical Course as of Feb 16 1643  Tue Feb 16, 2020  1609 Labs notable for WBC 15. Lactic >11, likely in the setting of prolonged seizure +/- infection. UA not convincing for infection. COVID negative. Receiving fluids and antibiotics and will recheck lactic. HR and temperature down trending with antipyretics and fluids.   CT scan with no acute intracranial hemorrhage or infarct. Stable meningioma with debatable trace vasogenic edema in left frontal. No midline shift, no obstructive hydrocephalus, no posterior fossa mass - feel it is appropriate to proceed with LP.   LP performed, CSF studies sent. Patient will require transfer for continuous EEG capability, which we do not have here. Son requests Mallory Peters.    [SM]  [id number] Concern for continued seizure like activity (jaw and extremity clenching) - given additional dose of Ativan and loaded with 1 g Keppra.    [SM]  [id number] Patient care transferred to oncoming provider due to change of shift. Pending transfer.    [SM]    Clinical Course User Index [SM] Lilia Pro., MD    ----------------------------------------- 4:44 PM on 02/16/2020 ----------------------------------------- Sepsis reassessment has been completed after 32ml/kg bolus and abx completed.   Initial high lactate likely due to status epilepticus and not septic shock.  Repeat lactate pending. BP remains stable..  Case d/w [facility], who accepts patient for transfer to [facility] ICU for higher level of care and continuous EEG monitoring. He recs titrating up propofol as tolerated and giving fentanyl for pain control.   ----------------------------------------- 5:14 PM on 02/16/2020 -----------------------------------------  Repeat lactate 3.6.  Blood pressure remained stable, other review borderline low blood pressures recently due to effect of sedative and pain medications when trying to increase sedation.  Do not think she is in shock, vasopressors  not indicated at this time.   Carrie Mew, MD 02/16/20 1715

## 2020-02-16 NOTE — Progress Notes (Signed)
Patient arrived with [facility]. CCM paged and are on the way. Genaro Bekker, Rande Brunt, RN

## 2020-02-16 NOTE — ED Notes (Signed)
Pt is intubated and on sedative medication. No family at bedside. No transfer signature performed at this time. Informed son of pt room at [facility]. Provided number and address.

## 2020-02-16 NOTE — H&P (Signed)
NAME:  Mallory Peters, MRN:  [id number], DOB:  05-18-1941, LOS: 0 ADMISSION DATE:  02/16/2020, CONSULTATION DATE:  02/16/2020 REFERRING MD:  Dr. Joan Mayans, ER, CHIEF COMPLAINT:  Seizure   Brief History   79 yo female found by son at home.  Reported have 20 minutes tonic/clonic seizure after [facility] arrived.  Hx of insomnia on chronic xanax.  Intubated for airway protection.  Transfer to [facility] for further neuro assessment.  Hx from chart.  Pt unable to provide hx.  Past Medical History  CVA. Osteoarthritis, Meningioma, HLD, HTN, Insomnia, GERD, Esophageal dysmotility, Depression, Dementia, CAD, Colon polyps, B12 deficiency, Anxiety  Significant Hospital Events   4/27 transfer from [facility] to [facility]  Consults:  Neurology seizure 4/27  Procedures:  ETT 4/27 >>  Significant Diagnostic Tests:   CT head 4/27 >> 1.2 cm Lt frontal convexity meningioma with possible trace vasogenic edema  LP 4/27 >> 0 WBC, 0 RBC, Glucose 80, Protein 71, gram stain negative  Micro Data:  SARS CoV2 PCR 4/27 >> negative Influenza PCR 4/27 >> negative Blood 4/27 >> Urine 4/27 >> CSF 4/27 >> CSF HSV 4/27 >>   Antimicrobials:  Acyclovir 4/27 Rocephin 4/27 Vancomycin 4/27  Interim history/subjective:    Objective   Blood pressure 99/61, pulse (!) 105, resp. rate 15, SpO2 100 %.    Vent Mode: PRVC FiO2 (%):  [30 %-100 %] 30 % Set Rate:  [15 bmp-22 bmp] 15 bmp Vt Set:  [400 mL-450 mL] 400 mL PEEP:  [5 cmH20] 5 cmH20  No intake or output data in the 24 hours ending 02/16/20 1933 There were no vitals filed for this visit.  Examination:  General - sedated Eyes - pupils reactive ENT - ETT in place Cardiac - regular rate/rhythm, no murmur Chest - equal breath sounds b/l, no wheezing or rales Abdomen - soft, non tender, + bowel sounds Extremities - no cyanosis, clubbing, or edema Skin - no rashes Neuro - RASS -2, myoclonic activity with stimulation   Resolved Hospital Problem list   Lactic  acidosis in setting of seizure  Assessment & Plan:   Compromised airway in setting of seizure. - full vent support - f/u CXR  Status epilepticus. - concern that meningioma might be cause for seizure focus - neurology consulted - f/u EEG - try to limit sedation while on vent for now  Fever. - no other signs of infection, LP negative, and procalcitonin normal - defer additional ABx for now pending culture results  Hypokalemia. - replace potassium  Hyperglycemia. - no hx of DM - SSI  Hx of dementia. - hold outpt aricept, risperdal for now  Elevated troponin likely from demand ischemia. Hx of CAD, CVA, HLD. - f/u Echo - continue ASA, plavix, crestor  Hx of depression, anxiety, insomnia. - hold outpt xanax, zoloft for now   Best practice:  Diet: NPO DVT prophylaxis: SCDs for 4/27 after recent LP; likely can add LMWH on 4/28 GI prophylaxis: protonix Mobility: bed rest Code Status: full code Disposition: ICU  Labs   CBC: Recent Labs  Lab 02/16/20 1412  WBC 15.1*  NEUTROABS 10.7*  HGB 14.1  HCT 44.3  MCV 87.4  PLT 0000000    Basic Metabolic Panel: Recent Labs  Lab 02/16/20 1412  NA 136  K 3.2*  CL 101  CO2 13*  GLUCOSE 181*  BUN 12  CREATININE 0.99  CALCIUM 9.2   GFR: Estimated Creatinine Clearance: 36.4 mL/min (by C-G formula based on SCr of 0.99 mg/dL).  Recent Labs  Lab 02/16/20 1401 02/16/20 1409 02/16/20 1412 02/16/20 1615  PROCALCITON  --  <0.10  --   --   WBC  --   --  15.1*  --   LATICACIDVEN >11.0*  --   --  3.6*    Liver Function Tests: Recent Labs  Lab 02/16/20 1412  AST 27  ALT 15  ALKPHOS 76  BILITOT 0.9  PROT 7.6  ALBUMIN 4.6   No results for input(s): LIPASE, AMYLASE in the last 168 hours. No results for input(s): AMMONIA in the last 168 hours.  ABG    Component Value Date/Time   PHART 7.38 02/16/2020 1434   PCO2ART 30 (L) 02/16/2020 1434   PO2ART 295 (H) 02/16/2020 1434   HCO3 17.7 (L) 02/16/2020 1434    ACIDBASEDEF 6.2 (H) 02/16/2020 1434   O2SAT 99.9 02/16/2020 1434     Coagulation Profile: Recent Labs  Lab 02/16/20 1412  INR 1.1    Cardiac Enzymes: Recent Labs  Lab 02/16/20 1409  CKTOTAL 40    HbA1C: No results found for: HGBA1C  CBG: Recent Labs  Lab 02/16/20 1408  GLUCAP 182*    Review of Systems:   Unable to obtain.  Past Medical History  She,  has a past medical history of Anginal pain ([place]), Anxiety, B12 deficiency, Colon polyps, CVD (cardiovascular disease), Dementia ([place]), Depression, Dysphagia, Dyspnea, Esophageal dysmotility, Gastritis, GERD (gastroesophageal reflux disease), Hemorrhoids, HNP (herniated nucleus pulposus), lumbar, Hyperlipidemia, Hypertension, Insomnia, Lipids blood increased, Meningioma ([place]), Non-cardiac chest pain, Osteoarthritis, Polyp of cervix, and Stroke (cerebrum) ([place]).   Surgical History    Past Surgical History:  Procedure Laterality Date  . BIOPSY SHOULDER    . CHOLECYSTECTOMY    . colonic polyps    . COLONOSCOPY    . COLONOSCOPY WITH PROPOFOL N/A 04/28/2018   Procedure: COLONOSCOPY WITH PROPOFOL;  Surgeon: Manya Silvas, MD;  Location: [facility] ENDOSCOPY;  Service: Endoscopy;  Laterality: N/A;  . ESOPHAGOGASTRODUODENOSCOPY (EGD) WITH PROPOFOL N/A 07/12/2016   Procedure: ESOPHAGOGASTRODUODENOSCOPY (EGD) WITH PROPOFOL;  Surgeon: Manya Silvas, MD;  Location: [facility] ENDOSCOPY;  Service: Endoscopy;  Laterality: N/A;  . SAVORY DILATION N/A 07/12/2016   Procedure: SAVORY DILATION;  Surgeon: Manya Silvas, MD;  Location: [facility] ENDOSCOPY;  Service: Endoscopy;  Laterality: N/A;  . TUBAL LIGATION       Social History   reports that she has never smoked. She has never used smokeless tobacco. She reports that she does not drink alcohol or use drugs.   Family History   Unable to obtain.  Allergies Allergies  Allergen Reactions  . Norco [Hydrocodone-Acetaminophen]     Drowsy and confused     Home Medications  Prior to  Admission medications   Medication Sig Start Date End Date Taking? Authorizing Provider  ALPRAZolam Duanne Moron) 0.25 MG tablet Take 0.25 mg by mouth at bedtime as needed for anxiety or sleep.     [provider]  aspirin EC 81 MG tablet Take 81 mg by mouth daily.    [provider]  Cholecalciferol (VITAMIN D3) 1000 units CAPS Take 1,000 Units by mouth daily.    [provider]  clopidogrel (PLAVIX) 75 MG tablet Take 75 mg by mouth daily.    [provider]  donepezil (ARICEPT) 5 MG tablet Take 5 mg by mouth every evening.  08/11/19   [provider]  risperiDONE (RISPERDAL) 0.25 MG tablet Take 0.25-0.5 mg by mouth daily.     [provider]  rosuvastatin (  CRESTOR) 10 MG tablet Take 10 mg by mouth daily.    [provider]  sertraline (ZOLOFT) 25 MG tablet Take 75 mg by mouth daily.     [provider]  vitamin B-12 (CYANOCOBALAMIN) 1000 MCG tablet Take 1,000 mcg by mouth daily.    [provider]     Critical care time: 39 minutes    D/w Dr. Leonel Ramsay.  Chesley Mires, MD [place] Pager - [phone] 02/16/2020, 7:48 PM

## 2020-02-16 NOTE — Consult Note (Signed)
Neurology Consultation Reason for Consult: Seizures Referring Physician: Governor Rooks  CC: Seizures  History is obtained from: Patient  HPI: Mallory Peters is a 79 y.o. female with new onset. Last night, he saw her late in the evening. This morning, she came downstairs and stated that she didn't feel right. She then took her meds as normal and then said a few words and then had a "thousand mile stair", but would not speak.  He later on the couch and then she went into an "full-blown seizure."  She apparently had a seizure en route with EMS as well and was given Versed.  She received multiple doses of Ativan in the ER at [facility] and there is persistent feet shaking, and then she had a more generalized seizure and was intubated.  She was loaded with Keppra 1 g.   She was transferred to [facility] for continuous EEG.  Of note, she was seen in the emergency department in November 2020 after her son reports a similar event, where she had a "1000 yard stare" but at that time did not go into an "full-blown seizure."  She was evaluated and discharged from the ER, CT at that time did demonstrate a meningioma.   LKW: 4/26 tpa given?: no, out of window.     ROS:  Unable to obtain due to altered mental status.   Past Medical History:  Diagnosis Date  . Anginal pain ([place])   . Anxiety   . B12 deficiency   . Colon polyps   . CVD (cardiovascular disease)   . Dementia ([place])   . Depression   . Dysphagia   . Dyspnea   . Esophageal dysmotility   . Gastritis   . GERD (gastroesophageal reflux disease)   . Hemorrhoids   . HNP (herniated nucleus pulposus), lumbar   . Hyperlipidemia   . Hypertension   . Insomnia   . Lipids blood increased   . Meningioma ([place])   . Non-cardiac chest pain   . Osteoarthritis   . Polyp of cervix   . Stroke (cerebrum) [facility])      Family history: Unable to obtain due to altered mental status   Social History:  reports that she has never smoked. She has never  used smokeless tobacco. She reports that she does not drink alcohol or use drugs.   Exam: Current vital signs: BP 99/61   Pulse (!) 105   Resp 15   SpO2 100%  Vital signs in last 24 hours: Temp:  [100.4 F (38 C)-101.9 F (38.8 C)] 101.5 F (38.6 C) (04/27 1800) Pulse Rate:  [99-157] 105 (04/27 1900) Resp:  [11-26] 15 (04/27 1911) BP: (90-150)/(52-95) 99/61 (04/27 1911) SpO2:  [96 %-100 %] 100 % (04/27 1900) FiO2 (%):  [30 %-100 %] 30 % (04/27 1900) Weight:  [58.2 kg] 58.2 kg (04/27 1421)   Physical Exam  Constitutional: Appears well-developed and well-nourished.  Psych: Does not respond Eyes: No scleral injection HENT: ET tube in place MSK: no joint deformities.  Cardiovascular: Normal rate and regular rhythm.  Respiratory: Effort normal, non-labored breathing GI: Soft.  No distension. There is no tenderness.  Skin: WDI  Neuro: Mental Status: Patient does not open eyes or follow commands. Cranial Nerves: II:  Pupils are equal, round, and reactive to light.   III,IV, VI: VOR intact V: VII: Corneals intact Motor: Tone is flaccid.  No response to noxious stimulation Sensory: As above  deep Tendon Reflexes: 2+ and symmetric in the biceps and  3+ at patellae.  Sustained clonus in bilateral ankles Cerebellar: Does not perform   I have reviewed labs in epic and the results pertinent to this consultation are: CSF RBC-0 CSF WBC-0 CSF protein-71 CSF glucose-80 Procalcitonin less than 0.1 Sodium-136 Calcium-9.2 Lactate-greater than 11  I have reviewed the images obtained: CT head-known meningioma with minimal peritumoral edema  Impression: 79 year old female with complex partial seizures with secondary generalization in the setting of meningioma, dementia, and previous stroke.  Given her previous episode, I do not think this is truly new onset seizures, but rather recurrent.  She has been started on Keppra and I will continue this.  I suspect her current dismal  mental status is more due to sedation than ongoing seizure activity, but I would favor continuous EEG overnight.  Recommendations: 1) stat EEG with continuous monitoring 2) MRI brain once able 3) continue Keppra 1 g twice daily 4) given benign CSF, no need to continue empiric CNS antimicrobial coverage 5) neurology will continue to follow  This patient is critically ill and at significant risk of neurological worsening, death and care requires constant monitoring of vital signs, hemodynamics,respiratory and cardiac monitoring, neurological assessment, discussion with family, other specialists and medical decision making of high complexity. I spent 65 minutes of neurocritical care time  in the care of  this patient. This was time spent independent of any time provided by nurse practitioner or PA.  Roland Rack, MD [facility] [phone]  If 7pm- 7am, please page neurology on call as listed in [place]. 02/16/2020  10:17 PM

## 2020-02-16 NOTE — Progress Notes (Signed)
vLTM started monitored by [facility]  Event button tested  Neurology notified

## 2020-02-16 NOTE — Progress Notes (Signed)
eLink Physician-Brief Progress Note Patient Name: Mallory Peters DOB: 27-Dec-1940 MRN: [id number]   Date of Service  02/16/2020  HPI/Events of Note  Pt with hypotension.  eICU Interventions  NS 500 ml iv fluid bolus x 1, Low dose Phenylephrine infusion ordered for hypotension not resolved by fluid bolus.        Kerry Kass Zurie Platas 02/16/2020, 11:45 PM

## 2020-02-16 NOTE — Progress Notes (Signed)
EEG complete - results pending 

## 2020-02-16 NOTE — ED Notes (Signed)
Date and time results received: 02/16/20 1513   Test: lactic acid Critical Value: >11  Name of Provider Notified: Dr. Joan Mayans

## 2020-02-16 NOTE — ED Notes (Signed)
Pt returned from [place] with this RN and RT Fritz Pickerel.

## 2020-02-16 NOTE — Procedures (Signed)
Patient Name: Mallory Peters  MRN: [id number]  EEG Attending: Roland Rack  Referring Physician/Provider: Mike Craze Date: 02/16/2020 Duration: 25 minutes  Patient history: 79 year old female with new onset seizures in the setting of left frontal meningioma  Level of alertness: Comatose  AEDs during EEG study: Keppra, Ativan  Technical aspects: This EEG study was done with scalp electrodes positioned according to the 10-20 International system of electrode placement. Electrical activity was acquired at a sampling rate of 500Hz  and reviewed with a high frequency filter of 70Hz  and a low frequency filter of 1Hz . EEG data were recorded continuously and digitally stored.   BACKGROUND ACTIVITY:  The background consists of generalized irregular delta range activity with superimposed bilaterally distributed excessive beta activity.  In addition, there are intermittent runs consistent with sleep spindles.  In addition to this, there is focal slowing in left frontotemporal region as well as an asymmetry with sleep spindles seen better on the right than left.   EPILEPTIFORM ACTIVITY: Interictal epileptiform activity: None  Ictal Activity: None  OTHER EVENTS: None  SLEEP RECORDINGS:  Sleep was present throughout the recording as described above.  ACTIVATION PROCEDURES:  Hyperventilation and photic stimulation were not performed.  IMPRESSION: This study recorded evidence of a focal left frontotemporal cerebral dysfunction. There was no seizure or definite evidence of seizure predisposition recorded on this study. Please note that lack of epileptiform activity on EEG does not preclude the possibility of epilepsy.    Roland Rack, MD [facility] [phone]  If 7pm- 7am, please page neurology on call as listed in [place].

## 2020-02-17 ENCOUNTER — Inpatient Hospital Stay (HOSPITAL_COMMUNITY): Payer: Medicare Other

## 2020-02-17 DIAGNOSIS — I361 Nonrheumatic tricuspid (valve) insufficiency: Secondary | ICD-10-CM

## 2020-02-17 LAB — BLOOD CULTURE ID PANEL (REFLEXED)

## 2020-02-17 LAB — POCT I-STAT 7, (LYTES, BLD GAS, ICA,H+H)
Acid-base deficit: 6 mmol/L — ABNORMAL HIGH (ref 0.0–2.0)
Bicarbonate: 18.8 mmol/L — ABNORMAL LOW (ref 20.0–28.0)
Calcium, Ion: 1.26 mmol/L (ref 1.15–1.40)
HCT: 36 % (ref 36.0–46.0)
Hemoglobin: 12.2 g/dL (ref 12.0–15.0)
O2 Saturation: 96 %
Patient temperature: 99
Potassium: 3.6 mmol/L (ref 3.5–5.1)
Sodium: 138 mmol/L (ref 135–145)
TCO2: 20 mmol/L — ABNORMAL LOW (ref 22–32)
pCO2 arterial: 35.8 mmHg (ref 32.0–48.0)
pH, Arterial: 7.33 — ABNORMAL LOW (ref 7.350–7.450)
pO2, Arterial: 91 mmHg (ref 83.0–108.0)

## 2020-02-17 LAB — BASIC METABOLIC PANEL
Anion gap: 14 (ref 5–15)
BUN: <5 mg/dL — ABNORMAL LOW (ref 8–23)
CO2: 13 mmol/L — ABNORMAL LOW (ref 22–32)
Calcium: 8.3 mg/dL — ABNORMAL LOW (ref 8.9–10.3)
Chloride: 110 mmol/L (ref 98–111)
Creatinine, Ser: 0.72 mg/dL (ref 0.44–1.00)
GFR calc Af Amer: >60 mL/min (ref >60)
GFR calc non Af Amer: >60 mL/min (ref >60)
Glucose, Bld: 120 mg/dL — ABNORMAL HIGH (ref 70–99)
Potassium: 3.6 mmol/L (ref 3.5–5.1)
Sodium: 137 mmol/L (ref 135–145)

## 2020-02-17 LAB — GLUCOSE, CAPILLARY
Glucose-Capillary: 100 mg/dL — ABNORMAL HIGH (ref 70–99)
Glucose-Capillary: 103 mg/dL — ABNORMAL HIGH (ref 70–99)
Glucose-Capillary: 111 mg/dL — ABNORMAL HIGH (ref 70–99)
Glucose-Capillary: 114 mg/dL — ABNORMAL HIGH (ref 70–99)
Glucose-Capillary: 115 mg/dL — ABNORMAL HIGH (ref 70–99)
Glucose-Capillary: 95 mg/dL (ref 70–99)

## 2020-02-17 LAB — CBC
HCT: 39.9 % (ref 36.0–46.0)
Hemoglobin: 12.6 g/dL (ref 12.0–15.0)
MCH: 28.1 pg (ref 26.0–34.0)
MCHC: 31.6 g/dL (ref 30.0–36.0)
MCV: 89.1 fL (ref 80.0–100.0)
Platelets: 205 10*3/uL (ref 150–400)
RBC: 4.48 MIL/uL (ref 3.87–5.11)
RDW: 13.2 % (ref 11.5–15.5)
WBC: 13.9 10*3/uL — ABNORMAL HIGH (ref 4.0–10.5)
nRBC: 0 % (ref 0.0–0.2)

## 2020-02-17 LAB — URINE CULTURE: Culture: NO GROWTH

## 2020-02-17 LAB — ECHOCARDIOGRAM COMPLETE
Height: 62 in
Weight: 2074.09 oz

## 2020-02-17 LAB — HEMOGLOBIN A1C
Hgb A1c MFr Bld: 5.5 % (ref 4.8–5.6)
Mean Plasma Glucose: 111.15 mg/dL

## 2020-02-17 LAB — LACTIC ACID, PLASMA: Lactic Acid, Venous: 2 mmol/L (ref 0.5–1.9)

## 2020-02-17 LAB — MAGNESIUM: Magnesium: 1.8 mg/dL (ref 1.7–2.4)

## 2020-02-17 LAB — PROCALCITONIN: Procalcitonin: 0.19 ng/mL

## 2020-02-17 LAB — PHOSPHORUS: Phosphorus: 2.8 mg/dL (ref 2.5–4.6)

## 2020-02-17 LAB — TROPONIN I (HIGH SENSITIVITY): Troponin I (High Sensitivity): 2274 ng/L (ref <18)

## 2020-02-17 MED ORDER — LORAZEPAM 2 MG/ML IJ SOLN: 2.0000 mg | INTRAMUSCULAR | Status: DC | PRN

## 2020-02-17 MED ORDER — LACTATED RINGERS IV BOLUS
500.0000 mL | Freq: Once | INTRAVENOUS | Status: AC
  Administered 2020-02-17: 10:00:00 500 mL via INTRAVENOUS

## 2020-02-17 MED ORDER — DEXTROSE 5 % IV SOLN
10.0000 mg/kg | Freq: Two times a day (BID) | INTRAVENOUS | Status: DC
  Administered 2020-02-17 – 2020-02-19 (×4): 500 mg via INTRAVENOUS
  Filled 2020-02-17 (×6): qty 10

## 2020-02-17 MED ORDER — POLYETHYLENE GLYCOL 3350 17 G PO PACK
17.0000 g | PACK | Freq: Every day | ORAL | Status: DC | PRN
Start: 2020-02-17 — End: 2020-02-17

## 2020-02-17 MED ORDER — SODIUM CHLORIDE 0.9 % IV SOLN
2.0000 g | INTRAVENOUS | Status: DC
  Administered 2020-02-17 – 2020-02-18 (×2): 2 g via INTRAVENOUS
  Filled 2020-02-17 (×2): qty 2
  Filled 2020-02-17: qty 20

## 2020-02-17 MED ORDER — PERFLUTREN LIPID MICROSPHERE
1.0000 mL | INTRAVENOUS | Status: AC | PRN
  Administered 2020-02-17: 3 mL via INTRAVENOUS
  Filled 2020-02-17: qty 10

## 2020-02-17 MED ORDER — POLYETHYLENE GLYCOL 3350 17 G PO PACK
17.0000 g | PACK | Freq: Every day | ORAL | Status: DC
  Administered 2020-02-19 – 2020-02-20 (×2): 17 g
  Filled 2020-02-17 (×3): qty 1

## 2020-02-17 MED ORDER — PHENYLEPHRINE HCL-NACL 10-0.9 MG/250ML-% IV SOLN
25.0000 ug/min | INTRAVENOUS | Status: DC
  Administered 2020-02-17: 120 ug/min via INTRAVENOUS
  Administered 2020-02-18: 180 ug/min via INTRAVENOUS
  Administered 2020-02-18: 60 ug/min via INTRAVENOUS
  Administered 2020-02-18 (×2): 120 ug/min via INTRAVENOUS
  Filled 2020-02-17: qty 500
  Filled 2020-02-17: qty 250
  Filled 2020-02-17: qty 500
  Filled 2020-02-17 (×2): qty 250

## 2020-02-17 MED ORDER — SODIUM CHLORIDE 0.9 % IV SOLN: 250.0000 mL | INTRAVENOUS | Status: DC

## 2020-02-17 NOTE — Progress Notes (Signed)
LTM EEG disconnected - no skin breakdown at unhook, skin redness on chest where ecg leads were placed, behind right ear. Nurse notified

## 2020-02-17 NOTE — Progress Notes (Signed)
Initial Nutrition Assessment  DOCUMENTATION CODES:   Not applicable  INTERVENTION:   If unable to extubate patient within the next 24 hours, recommend start TF via OGT:   Vital AF 1.2 at 55 ml/h (1320 ml per day)   Provides 1584 kcal, 99 gm protein, 1071 ml free water daily  NUTRITION DIAGNOSIS:   Inadequate oral intake related to inability to eat as evidenced by NPO status.  GOAL:   Patient will meet greater than or equal to 90% of their needs  MONITOR:   Vent status, I & O's, Labs  REASON FOR ASSESSMENT:   Ventilator    ASSESSMENT:   79 yo female admitted with recurrent seizure. Required intubation. PMH includes CVA, meningioma, HLD, HTN, GERD, esophageal dysmotility, dementia, CAD, B12 deficiency.  Continuous EEG ongoing. On no sedation.  Patient is currently intubated on ventilator support MV: 6.1 L/min Temp (24hrs), Avg:101 F (38.3 C), Min:99 F (37.2 C), Max:102.4 F (39.1 C)   Labs reviewed.  CBG's: 114-103  Medications reviewed and include colace, novolog, miralax, keppra, neosynephrine.  IVF: LR at 75 ml/h  Usual weight 63.5 kg in November 2020, currently 58.8 kg. 7% weight loss within 6 months is not significant for the time frame.    NUTRITION - FOCUSED PHYSICAL EXAM:    Most Recent Value  Orbital Region  Unable to assess  Upper Arm Region  Moderate depletion  Thoracic and Lumbar Region  No depletion  Buccal Region  Unable to assess  Temple Region  Mild depletion  Clavicle Bone Region  Mild depletion  Clavicle and Acromion Bone Region  Mild depletion  Scapular Bone Region  Mild depletion  Dorsal Hand  No depletion  Patellar Region  No depletion  Anterior Thigh Region  No depletion  Posterior Calf Region  No depletion  Edema (RD Assessment)  Mild  Hair  Reviewed  Eyes  Unable to assess  Mouth  Unable to assess  Skin  Reviewed  Nails  Reviewed       Diet Order:   Diet Order            Diet NPO time specified  Diet  effective now              EDUCATION NEEDS:   Not appropriate for education at this time  Skin:  Skin Assessment: Reviewed RN Assessment  Last BM:  no BM documented  Height:   Ht Readings from Last 1 Encounters:  02/17/20 5\' 2"  (1.575 m)    Weight:   Wt Readings from Last 1 Encounters:  02/17/20 58.8 kg    Ideal Body Weight:  50 kg  BMI:  Body mass index is 23.71 kg/m.  Estimated Nutritional Needs:   Kcal:  1650  Protein:  75-85 gm  Fluid:  >/= 1.7 L    Molli Barrows, RD, LDN, CNSC Please refer to Amion for contact information.

## 2020-02-17 NOTE — Progress Notes (Signed)
PHARMACY - PHYSICIAN COMMUNICATION CRITICAL VALUE ALERT - BLOOD CULTURE IDENTIFICATION (BCID)  Mallory Peters is an 79 y.o. female who presented to [facility] on 02/16/2020 with a chief complaint of seizures.   Assessment:  Blood culture 1/4 staph species, coag negative mec a detected   Name of physician (or Provider) Contacted: Eliseo Gum, NP  Current antibiotics: No current antibiotics   Changes to prescribed antibiotics recommended: Recommend no current antibiotics needed and repeat blood cultures  Recommendations accepted by provider  No results found for this or any previous visit.   Cristela Felt, PharmD PGY1 Pharmacy Resident [employer]: [phone]  02/17/2020  7:59 AM

## 2020-02-17 NOTE — Progress Notes (Signed)
Pharmacy Antibiotic Note  Mallory Peters is a 79 y.o. female admitted on 02/16/2020 with herpes encephalitis.  Pharmacy has been consulted for acyclovir dosing.  Pt presented with seizures to [facility] before transferring here. She was give vanc/ceftriaxone/acyclovir x 1 there. CSF culture has remained neg here. HSV PCR is still pending. Acyclovir is being re-ordered to r/o HSV encephalitis.   Wt 59kg, IBW 50kg, ABW 54kg Scr 0.72 CrCl 45 ml/min  Plan: Acyclovir 500mg  IV q12 F/u renal function F/u HSV PCR  Height: 5\' 2"  (157.5 cm) Weight: 58.8 kg (129 lb 10.1 oz) IBW/kg (Calculated) : 50.1  Temp (24hrs), Avg:100.9 F (38.3 C), Min:99 F (37.2 C), Max:102.4 F (39.1 C)  Recent Labs  Lab 02/16/20 1401 02/16/20 1412 02/16/20 1615 02/17/20 0448 02/17/20 1033  WBC  --  15.1*  --  13.9*  --   CREATININE  --  0.99  --  0.72  --   LATICACIDVEN >11.0*  --  3.6*  --  2.0*    Estimated Creatinine Clearance: 45.1 mL/min (by C-G formula based on SCr of 0.72 mg/dL).    Allergies  Allergen Reactions  . Ciprofloxacin Other (See Comments)    Hallucinations  . Metronidazole Other (See Comments)    Hallucinations  . Norco [Hydrocodone-Acetaminophen]     Drowsy and confused    Antimicrobials this admission: 4/27 vanc x1 4/27 ceftriaxone>> 4/27 acyclovir>>  Dose adjustments this admission:   Microbiology results: 4/28 blood>> 4/28 resp>> 4/27 CSF>>ngtd  Onnie Boer, PharmD, BCIDP, AAHIVP, CPP Infectious Disease Pharmacist 02/17/2020 6:12 PM

## 2020-02-17 NOTE — Progress Notes (Signed)
  Echocardiogram 2D Echocardiogram has been performed.  Geoffery Lyons Swaim 02/17/2020, 2:04 PM

## 2020-02-17 NOTE — Procedures (Addendum)
Patient Name: Mallory Peters  MRN: [id number]  Epilepsy Attending: Lora Havens  Referring Physician/Provider: Dr Roland Rack Duration: 02/16/2020 2053 to 02/17/2020 1059  Patient history: 79 year old female with new onset seizures in the setting of left frontal meningioma. EEG to evaluate for seizure  Level of alertness: comatose  AEDs during EEG study: Keppra  Technical aspects: This EEG study was done with scalp electrodes positioned according to the 10-20 International system of electrode placement. Electrical activity was acquired at a sampling rate of 500Hz  and reviewed with a high frequency filter of 70Hz  and a low frequency filter of 1Hz . EEG data were recorded continuously and digitally stored.   DESCRIPTION: EEG showed periodic lateralized epileptiform discharges in left hemisphere, maximal left frontal region at 0.25Hz . Continuous generalized 3-5hz  theta-delta slowing admixed with 14-16hz  beta activity was also noted. During sleep, sleep spindles (12-14Hz ) maximal frontocentral , left<right were also seen. Hyperventilation and photic stimulation were not performed.  ABNORMALITY - PLEDs, left hemisphere, maximal left frontal - Continuous slow, generalized - Spindle asymmetry, left <right   IMPRESSION: This study showed evidence of epileptogenicity as well as cortical dysfunction arising from left hemisphere due to underlying structural abnormality. Additionally, there is evidence of severe diffuse encephalopathy, non specific to etiology. No seizures were seen throughout the recording.    Jakiah Goree Barbra Sarks

## 2020-02-17 NOTE — Progress Notes (Addendum)
Subjective: No further seizures overnight.   ROS: unable to obtain due to poor mental status  Examination  Vital signs in last 24 hours: Temp:  [99 F (37.2 C)-102.4 F (39.1 C)] 99.9 F (37.7 C) (04/28 1000) Pulse Rate:  [65-157] 65 (04/28 1000) Resp:  [11-29] 16 (04/28 1000) BP: (81-150)/(43-95) 98/71 (04/28 1000) SpO2:  [96 %-100 %] 97 % (04/28 1000) FiO2 (%):  [30 %-100 %] 30 % (04/28 0805) Weight:  [58.2 kg-58.8 kg] 58.8 kg (04/28 0306)  General: lying in bed, not in apparent distress CVS: pulse-normal rate and rhythm RS: breathing comfortably, intubated Extremities: normal, warm  Neuro: MS: [place] but does not open eyes to noxious stimuli, does not follow commands CN: pupils equal and reactive,   corneal reflex intact, gag reflex intact  Motor: Withdraws to noxious stimuli in all 4 extremities, tremor-like movement in left upper extremity on noxious stimulation Reflexes: 1+ bilaterally over patella, biceps  Basic Metabolic Panel: Recent Labs  Lab 02/16/20 1412 02/17/20 0426 02/17/20 0448  NA 136 138 137  K 3.2* 3.6 3.6  CL 101  --  110  CO2 13*  --  13*  GLUCOSE 181*  --  120*  BUN 12  --  <5*  CREATININE 0.99  --  0.72  CALCIUM 9.2  --  8.3*  MG  --   --  1.8  PHOS  --   --  2.8    CBC: Recent Labs  Lab 02/16/20 1412 02/17/20 0426 02/17/20 0448  WBC 15.1*  --  13.9*  NEUTROABS 10.7*  --   --   HGB 14.1 12.2 12.6  HCT 44.3 36.0 39.9  MCV 87.4  --  89.1  PLT 296  --  205     Coagulation Studies: Recent Labs    02/16/20 1412  LABPROT 13.7  INR 1.1    Imaging CT head without contrast 02/16/2020: 1. No evidence of acute intracranial hemorrhage or acute infarct. 2. Stable 1.2 cm left frontal convexity meningioma. There may be trace vasogenic edema within the underlying left frontal lobe. 3. Stable mild generalized parenchymal atrophy and mild-to-moderate chronic small vessel ischemic disease. 4. Mild ethmoid sinus mucosal  thickening.     ASSESSMENT AND PLAN: 79 year old female who presented with seizures in setting of meningioma.    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 -Patient continues to be encephalopathic which could be secondary to prolonged postictal state, antiseizure medications, dementia -LP did not show any evidence of CNS infection, UA bland  Recommendations -We will discontinue LTM EEG as patient has not had any seizures overnight -Continue Keppra 1000 mg twice daily for now.  Can consider reducing to 750 mg twice daily patient continues to be encephalopathic -We will obtain MRI brain to look for any other acute abnormalities -Continue seizure precautions -As needed IV Ativan for generalized tonic-clonic seizure lasting more than 2 minutes of focal seizure lasting more than 5 minutes -Management of rest of the comorbidities per primary team -Called son to update but went to voicemail, voicemail left with callback number  CRITICAL CARE Performed by: Lora Havens   Total critical care time: 35 minutes  Critical care time was exclusive of separately billable procedures and treating other patients.  Critical care was necessary to  treat or prevent imminent or life-threatening deterioration.  Critical care was time spent personally by me on the following activities: development of treatment plan with patient and/or surrogate as well as nursing, discussions with consultants, evaluation of patient's response to treatment, examination of patient, obtaining history from patient or surrogate, ordering and performing treatments and interventions, ordering  and review of laboratory studies, ordering and review of radiographic studies, pulse oximetry and re-evaluation of patient's condition.   Zeb Comfort Epilepsy [facility] For questions after 5pm please refer to AMION to reach the Neurologist on call

## 2020-02-17 NOTE — Progress Notes (Signed)
eLink Physician-Brief Progress Note Patient Name: Mallory Peters DOB: [birth year] MRN: [id number]   Date of Service  02/17/2020  HPI/Events of Note  Hypotension - Request to increase Phenylephrine IV infusion ceiling to 200 mcg/min.  eICU Interventions  Will order: 1. Phenylephrine IV infusion via PIV. Titrate to MAP >= 65.      Intervention Category Major Interventions: Hypotension - evaluation and management  Lysle Dingwall 02/17/2020, 11:34 PM

## 2020-02-17 NOTE — Progress Notes (Signed)
PULMONARY / CRITICAL CARE MEDICINE   Name: Mallory Peters MRN: [id number] DOB: 1941-09-18    ADMISSION DATE:  02/16/2020  CHIEF COMPLAINT: Status Epilepticus   HISTORY OF PRESENT ILLNESS:  Mallory Peters is a 79 y.o. female with new onset. Last night, he saw her late in the evening. This morning, she came downstairs and stated that she didn't feel right. She then took her meds as normal and then said a few words and then had a "thousand mile stair", but would not speak.  He later on the couch and then she went into an "full-blown seizure."  She apparently had a seizure en route with EMS as well and was given Versed.  She received multiple doses of Ativan in the ER at [facility] and there is persistent feet shaking, and then she had a more generalized seizure and was intubated. She was transferred to [facility] for continuous EEG.    PAST MEDICAL HISTORY :    Hx of insomnia on chronic xanax.  CVA. Osteoarthritis, Meningioma, HLD, HTN, Insomnia, GERD, Esophageal dysmotility, Depression, Dementia, CAD, Colon polyps, B12 deficiency, Anxiety. She  has a past medical history of Anginal pain ([place]), Anxiety, B12 deficiency, Colon polyps, CVD (cardiovascular disease), Dementia ([place]), Depression, Dysphagia, Dyspnea, Esophageal dysmotility, Gastritis, GERD (gastroesophageal reflux disease), Hemorrhoids, HNP (herniated nucleus pulposus), lumbar, Hyperlipidemia, Hypertension, Insomnia, Lipids blood increased, Meningioma ([place]), Non-cardiac chest pain, Osteoarthritis, Polyp of cervix, and Stroke (cerebrum) ([place]).  PAST SURGICAL HISTORY: She  has a past surgical history that includes colonic polyps; Cholecystectomy; Tubal ligation; Biopsy shoulder; Esophagogastroduodenoscopy (egd) with propofol (N/A, 07/12/2016); Savory dilation (N/A, 07/12/2016); Colonoscopy; and Colonoscopy with propofol (N/A, 04/28/2018).  Allergies  Allergen Reactions  . Ciprofloxacin Other (See Comments)    Hallucinations  .  Metronidazole Other (See Comments)    Hallucinations  . Norco [Hydrocodone-Acetaminophen]     Drowsy and confused    No current facility-administered medications on file prior to encounter.   Current Outpatient Medications on File Prior to Encounter  Medication Sig  . ALPRAZolam (XANAX) 0.25 MG tablet Take 0.25 mg by mouth at bedtime as needed for anxiety or sleep.   Marland Kitchen aspirin EC 81 MG tablet Take 81 mg by mouth daily.  . Cholecalciferol (VITAMIN D3) 1000 units CAPS Take 1,000 Units by mouth daily.  . clopidogrel (PLAVIX) 75 MG tablet Take 75 mg by mouth daily.  Marland Kitchen donepezil (ARICEPT) 5 MG tablet Take 5 mg by mouth every evening.   . risperiDONE (RISPERDAL) 0.25 MG tablet Take 0.25 mg by mouth 2 (two) times daily.   . rosuvastatin (CRESTOR) 10 MG tablet Take 10 mg by mouth daily.  . sertraline (ZOLOFT) 25 MG tablet Take 75 mg by mouth at bedtime.   . vitamin B-12 (CYANOCOBALAMIN) 1000 MCG tablet Take 1,000 mcg by mouth daily.    FAMILY HISTORY:  Her has no family status information on file.    SOCIAL HISTORY: She  reports that she has never smoked. She has never used smokeless tobacco. She reports that she does not drink alcohol or use drugs.  REVIEW OF SYSTEMS:     VITAL SIGNS: BP 108/69   Pulse 72   Temp 100.2 F (37.9 C)   Resp 15   Ht 5\' 2"  (1.575 m)   Wt 58.8 kg   SpO2 100%   BMI 23.71 kg/m    VENTILATOR SETTINGS: Vent Mode: PSV;CPAP FiO2 (%):  [30 %-100 %] 30 % Set Rate:  [15 bmp-22 bmp] 15 bmp Vt Set:  [  400 mL-450 mL] 400 mL PEEP:  [5 cmH20] 5 cmH20 Pressure Support:  [10 cmH20] 10 cmH20 Plateau Pressure:  [11 cmH20-12 cmH20] 11 cmH20  INTAKE / OUTPUT: I/O last 3 completed shifts: In: 1368.6 [I.V.:891.5; NG/GT:150; IV Piggyback:327.1] Out: 1200 [Urine:1200]  PHYSICAL EXAMINATION: Physical Exam  Physical Exam  Constitutional: Appears well-developed and well-nourished.  Psych: Does not respond Eyes: No scleral injection HENT: ET tube in place MSK:  no joint deformities.  Cardiovascular: Normal rate and regular rhythm.  Respiratory: Effort normal, non-labored breathing GI: Soft.  No distension. There is no tenderness.  Skin: WDI Neuro: Somnolent MS: Winces but does not open eyes to noxious stimuli, does not follow commands CN: pupils equal and reactive,   corneal reflex intact, gag reflex intact  Motor: Withdraws to noxious stimuli in all 4 extremities, tremor-like movement in left upper extremity on noxious stimulation  LABS:  BMET Recent Labs  Lab 02/16/20 1412 02/17/20 0426 02/17/20 0448  NA 136 138 137  K 3.2* 3.6 3.6  CL 101  --  110  CO2 13*  --  13*  BUN 12  --  <5*  CREATININE 0.99  --  0.72  GLUCOSE 181*  --  120*    Electrolytes Recent Labs  Lab 02/16/20 1412 02/17/20 0448  CALCIUM 9.2 8.3*  MG  --  1.8  PHOS  --  2.8    CBC Recent Labs  Lab 02/16/20 1412 02/17/20 0426 02/17/20 0448  WBC 15.1*  --  13.9*  HGB 14.1 12.2 12.6  HCT 44.3 36.0 39.9  PLT 296  --  205    Coag's Recent Labs  Lab 02/16/20 1409 02/16/20 1412  APTT <24*  --   INR  --  1.1    Sepsis Markers Recent Labs  Lab 02/16/20 1401 02/16/20 1409 02/16/20 1615 02/17/20 1033  LATICACIDVEN >11.0*  --  3.6* 2.0*  PROCALCITON  --  <0.10  --  0.19    ABG Recent Labs  Lab 02/16/20 1434 02/17/20 0426  PHART 7.38 7.330*  PCO2ART 30* 35.8  PO2ART 295* 91    Liver Enzymes Recent Labs  Lab 02/16/20 1412  AST 27  ALT 15  ALKPHOS 76  BILITOT 0.9  ALBUMIN 4.6    Cardiac Enzymes No results for input(s): TROPONINI, PROBNP in the last 168 hours.  Glucose Recent Labs  Lab 02/16/20 1408 02/16/20 1940 02/16/20 2316 02/17/20 0338 02/17/20 0800 02/17/20 1148  GLUCAP 182* 120* 121* 114* 103* 111*    Imaging CT Head Wo Contrast  Result Date: 02/16/2020 CLINICAL DATA:  New onset seizure. EXAM: CT HEAD WITHOUT CONTRAST TECHNIQUE: Contiguous axial images were obtained from the base of the skull through the  vertex without intravenous contrast. COMPARISON:  Noncontrast head CT 08/26/2019, brain MRI 08/28/2019 FINDINGS: Brain: A partially calcified meningioma overlying the left frontal lobe convexity has remained stable in size as compared to head CT 08/26/2019, again measuring 1.2 cm (series 5, image 12) (series 3, image 22). There may be trace vasogenic edema within the underlying left frontal lobe (series 3, image 21). Stable mild-to-moderate patchy hypodensity within the cerebral white matter which is nonspecific, but consistent with chronic small vessel ischemic disease. Stable, mild generalized parenchymal atrophy. There is no acute intracranial hemorrhage. No demarcated cortical infarct. No extra-axial fluid collection. No evidence of intracranial mass. No midline shift. Vascular: No hyperdense vessel.  Atherosclerotic calcifications. Skull: Normal. Negative for fracture or focal lesion. Sinuses/Orbits: Visualized orbits show no acute finding. Mild ethmoid sinus  mucosal thickening. No significant mastoid effusion. IMPRESSION: 1. No evidence of acute intracranial hemorrhage or acute infarct. 2. Stable 1.2 cm left frontal convexity meningioma. There may be trace vasogenic edema within the underlying left frontal lobe. 3. Stable mild generalized parenchymal atrophy and mild-to-moderate chronic small vessel ischemic disease. 4. Mild ethmoid sinus mucosal thickening. Electronically Signed   By: Kellie Simmering DO   On: 02/16/2020 15:13   DG Chest Port 1 View  Result Date: 02/17/2020 CLINICAL DATA:  Endotracheally intubated. Seizure. EXAM: PORTABLE CHEST 1 VIEW COMPARISON:  02/16/2020 FINDINGS: Endotracheal tube tip is now less than 1 cm above the carina. Nasogastric tube is in appropriate position with tip overlying the gastric fundus. Increased opacity in the left retrocardiac lung base may be due to atelectasis or infiltrate. Right lung is clear. No evidence of pneumothorax or pleural effusion. IMPRESSION: 1.  Endotracheal tube tip is now less than 1 cm above the carina. 2. Increased left retrocardiac atelectasis versus infiltrate. Electronically Signed   By: Marlaine Hind M.D.   On: 02/17/2020 08:04   DG Chest Port 1 View  Result Date: 02/16/2020 CLINICAL DATA:  Hypoxia.  Fever. EXAM: PORTABLE CHEST 1 VIEW COMPARISON:  August 26, 2019 FINDINGS: Endotracheal tube tip is 3.4 cm above the carina. Nasogastric tube tip and side port are in the stomach. No pneumothorax. There is no edema or airspace opacity. Heart size and pulmonary vascularity are normal. No adenopathy. There is aortic atherosclerosis. There is degenerative change in each shoulder. There is lower thoracic levoscoliosis. IMPRESSION: Tube positions as described without pneumothorax. Lungs clear. Cardiac silhouette normal. Aortic Atherosclerosis (ICD10-I70.0). Electronically Signed   By: Lowella Grip III M.D.   On: 02/16/2020 14:50     SIGNIFICANT EVENTS: Status epilepticus (resolved)  LINES/TUBES: Drain  NG/OG Tube Orogastric 18 Fr. Center mouth Aucultation Documented cm marking at nare/ corner of mouth 58 cm 1 day  Urethral Catheter Kayla EDT Temperature probe 14 Fr. 1 day  Airway  Airway 8 mm 1 day  PIV Line  Peripheral IV 02/16/20 Right Antecubital 1 day  Peripheral IV 02/16/20 Right Hand 1 day  Peripheral IV 02/16/20 Left Forearm less than 1 day  Peripheral IV 02/16/20 Right Hand less than 1 day     DISCUSSION: 02/17/20 1049  Seizure precautions Until discontinued    02/17/20 1048  02/17/20 0500  Change oral suction equipment (yankauer, tubing, canister lining and y-connector) Daily    02/16/20 1930  02/16/20 2000  Suction Oropharanx Every 4 hours    02/16/20 1930  02/16/20 1946  STAT CBG when hypoglycemia is suspected. If treated, recheck every 15 minutes after each treatment until CBG >/= 70 mg/dl Until discontinued    02/16/20 1945  02/16/20 1946  Refer to Hypoglycemia Protocol Sidebar Report for treatment of  CBG < 70 mg/dl Until discontinued    02/16/20 1945  02/16/20 Real PCCM Adult ICU Electrolyte Replacement Protocol Until discontinued    02/16/20 1930  02/16/20 1929  Elevate head of bed greater than 30 degrees unless contraindicated Until discontinued    02/16/20 1930  02/16/20 1929  Suction deep laryngeal - Subglottic See Comments  Comments: Intermittent suctioning. No g...   02/16/20 1930  02/16/20 1929  Use Y-connector to maintain separate oral and ETT/Trach suctioning Until discontinued    02/16/20 1930  Unscheduled  Suction Oropharynx As needed    02/16/20 1930  Unscheduled  Endotracheal tube suction As needed  ASSESSMENT / PLAN:  79 year old female with new onset seizures (status epilepcticus) in the setting of left frontal meningioma. Previous EEG from admission demonstrates focal left frontotemporal region cerebral dysfunction. This was later confirmed with later EEG showing evidence of epileptogenicity with cortical dysfunction originating in the left hemisphere likely due to structural abnormality.  Non conCT revealed no evidence of acute intracranial hemorrhage or infarct. A stable 1.2 cm left frontal convexity meningioma with slight surrounding vasogenic edema within the underlying left frontal lobe. Previous MRI (3/20) notes presence of stable meningioma  Pt was hypotensive and febrile on admission to unit. On examination this morning patient was able to open her eyes when called but not able to follow commands. Patient is somnolent and hypotensive.   Differential dx does include infectious etiologies warranting urgent intervention pneumonia/UTI/meningitis/bacteremia/sepsis as patient is febrile with lactic acidosis and leukocytosis, though can also be caused by duration of seizure activity, work up must include r/o of previously mentioned eti w empiric treatment. -UA/Ucx does not show indications to suspect bacterial etiologies. -LP CSF revealed  no findings concerning for meningitis, blood cultures and gram stain demonstrate no growth (culture demonstrated gram pos cocci growth, however, considered likely contamination of skin flora), MRSA PCR negative. Meningitis less likely due to normal CSF findings via LP -Lack of growth seen in blood cultures, bacteremia/sepsis less likely.  -CXR demonstrates increased opacification within the left retrocardiac lung base may be due to atelectasis or infiltrate. CAP empiric treatment initiated, labs ordered.  Plan -ABG -Respiratory fluid culture -Start empiric ceftriaxone 2g in 0.9%NS IV @200mL /hr q24hrs (first dose today 1430) - IV NS bolus 577mL  - Cont LR infusion 87mL/hr -CBC w Diff -Trend lactate  Status Epilepticus (resolved); Patient does remain encephalopathic. Possible secondary to prolonged seizure metabolic disturbances /postictal state, anti epileptic medications and underlying dementia.  Blood lactate trending down as Seizure activity successfully aborted can also present similarly without sign of infection due to systemic secondary effects of seizure duration. The decline in serum lactate prior to initiation of abx and after seizure activity resolves is more indicative of Status Epilepticus as primary eti. EEG has demonstrated evidence localizing focal cerebral wave abnormalities in the frontocentral region which is also location of stable meningioma which has demonstrated slight (approx 20mm ) growth since previous neuro imaging noted in 01/09/19. NonconCT confirms lesion with mild vasogenic edema surrounding meningioma. No new evidence of hemorrhage or infarct noted making CVA less likely etiology. Patient does have diagnosed dementia and imaging demonstrates generalize parenchymal atrophy. Serial EEG monitoring has not demonstrated further evidence of seizures since resolution of SE.  Plans -Discontinue LTM EEG  -MRI brain  -Cont levetiracetam  -Cont Lorazapam/ Midazolam PRN  seizure -Maintain hemodynamic status   --fluid rescus  -Phenylephrine 68mcg PRN for hypotension    FAMILY  - Updates: Son was contacted, voicemail left for son.  - Inter-disciplinary family meet or Palliative Care meeting due by:   William Dalton MS4  02/17/2020, 1:34 PM

## 2020-02-18 ENCOUNTER — Inpatient Hospital Stay (HOSPITAL_COMMUNITY): Payer: Medicare Other

## 2020-02-18 ENCOUNTER — Inpatient Hospital Stay: Payer: Self-pay

## 2020-02-18 DIAGNOSIS — G40901 Epilepsy, unspecified, not intractable, with status epilepticus: Secondary | ICD-10-CM | POA: Diagnosis not present

## 2020-02-18 LAB — PROCALCITONIN: Procalcitonin: 0.15 ng/mL

## 2020-02-18 LAB — GLUCOSE, CAPILLARY
Glucose-Capillary: 100 mg/dL — ABNORMAL HIGH (ref 70–99)
Glucose-Capillary: 96 mg/dL (ref 70–99)
Glucose-Capillary: 88 mg/dL (ref 70–99)
Glucose-Capillary: 90 mg/dL (ref 70–99)
Glucose-Capillary: 91 mg/dL (ref 70–99)
Glucose-Capillary: 78 mg/dL (ref 70–99)

## 2020-02-18 LAB — HSV DNA BY PCR (REFERENCE LAB)
HSV 1 DNA: NEGATIVE
HSV 2 DNA: NEGATIVE

## 2020-02-18 LAB — MAGNESIUM: Magnesium: 1.8 mg/dL (ref 1.7–2.4)

## 2020-02-18 LAB — BASIC METABOLIC PANEL
Anion gap: 8 (ref 5–15)
BUN: <5 mg/dL — ABNORMAL LOW (ref 8–23)
CO2: 21 mmol/L — ABNORMAL LOW (ref 22–32)
Calcium: 8.5 mg/dL — ABNORMAL LOW (ref 8.9–10.3)
Chloride: 114 mmol/L — ABNORMAL HIGH (ref 98–111)
Creatinine, Ser: 0.6 mg/dL (ref 0.44–1.00)
GFR calc Af Amer: >60 mL/min (ref >60)
GFR calc non Af Amer: >60 mL/min (ref >60)
Glucose, Bld: 108 mg/dL — ABNORMAL HIGH (ref 70–99)
Potassium: 3.6 mmol/L (ref 3.5–5.1)
Sodium: 143 mmol/L (ref 135–145)

## 2020-02-18 LAB — CBC
HCT: 38.2 % (ref 36.0–46.0)
Hemoglobin: 12.3 g/dL (ref 12.0–15.0)
MCH: 28.1 pg (ref 26.0–34.0)
MCHC: 32.2 g/dL (ref 30.0–36.0)
MCV: 87.4 fL (ref 80.0–100.0)
Platelets: 229 10*3/uL (ref 150–400)
RBC: 4.37 MIL/uL (ref 3.87–5.11)
RDW: 13.3 % (ref 11.5–15.5)
WBC: 15.1 10*3/uL — ABNORMAL HIGH (ref 4.0–10.5)
nRBC: 0 % (ref 0.0–0.2)

## 2020-02-18 LAB — PHOSPHORUS: Phosphorus: 2.2 mg/dL — ABNORMAL LOW (ref 2.5–4.6)

## 2020-02-18 LAB — LACTIC ACID, PLASMA: Lactic Acid, Venous: 1.5 mmol/L (ref 0.5–1.9)

## 2020-02-18 LAB — TSH: TSH: 1.731 u[IU]/mL (ref 0.350–4.500)

## 2020-02-18 LAB — VITAMIN B12: Vitamin B-12: 1031 pg/mL — ABNORMAL HIGH (ref 180–914)

## 2020-02-18 MED ORDER — DOCUSATE SODIUM 100 MG PO CAPS
100.0000 mg | ORAL_CAPSULE | Freq: Every day | ORAL | Status: DC
  Administered 2020-02-20 – 2020-02-22 (×3): 100 mg via ORAL
  Filled 2020-02-18 (×4): qty 1

## 2020-02-18 MED ORDER — ROSUVASTATIN CALCIUM 5 MG PO TABS
10.0000 mg | ORAL_TABLET | Freq: Every day | ORAL | Status: DC
  Administered 2020-02-18 – 2020-02-22 (×5): 10 mg via ORAL
  Filled 2020-02-18 (×5): qty 2

## 2020-02-18 MED ORDER — PANTOPRAZOLE SODIUM 40 MG PO TBEC
40.0000 mg | DELAYED_RELEASE_TABLET | Freq: Every day | ORAL | Status: DC
  Administered 2020-02-19 – 2020-02-22 (×4): 40 mg via ORAL
  Filled 2020-02-18 (×4): qty 1

## 2020-02-18 MED ORDER — CHLORHEXIDINE GLUCONATE 0.12 % MT SOLN: 15.0000 mL | Freq: Two times a day (BID) | OROMUCOSAL | Status: DC

## 2020-02-18 MED ORDER — CHLORHEXIDINE GLUCONATE 0.12 % MT SOLN
15.0000 mL | Freq: Two times a day (BID) | OROMUCOSAL | Status: DC
  Administered 2020-02-18 – 2020-02-20 (×5): 15 mL via OROMUCOSAL
  Filled 2020-02-18 (×5): qty 15

## 2020-02-18 MED ORDER — SODIUM CHLORIDE 0.9% FLUSH: 10.0000 mL | INTRAVENOUS | Status: DC | PRN

## 2020-02-18 MED ORDER — ORAL CARE MOUTH RINSE
15.0000 mL | Freq: Two times a day (BID) | OROMUCOSAL | Status: DC
  Administered 2020-02-20 – 2020-02-22 (×4): 15 mL via OROMUCOSAL

## 2020-02-18 MED ORDER — LACTATED RINGERS IV BOLUS
500.0000 mL | Freq: Once | INTRAVENOUS | Status: AC
  Administered 2020-02-18: 500 mL via INTRAVENOUS

## 2020-02-18 MED ORDER — ACETAMINOPHEN 325 MG PO TABS
650.0000 mg | ORAL_TABLET | Freq: Four times a day (QID) | ORAL | Status: DC | PRN
  Administered 2020-02-18: 650 mg via ORAL
  Filled 2020-02-18: qty 2

## 2020-02-18 MED ORDER — ENOXAPARIN SODIUM 40 MG/0.4ML ~~LOC~~ SOLN
40.0000 mg | SUBCUTANEOUS | Status: DC
  Administered 2020-02-18 – 2020-02-22 (×5): 40 mg via SUBCUTANEOUS
  Filled 2020-02-18 (×6): qty 0.4

## 2020-02-18 MED ORDER — SODIUM CHLORIDE 0.9% FLUSH
10.0000 mL | Freq: Two times a day (BID) | INTRAVENOUS | Status: DC
  Administered 2020-02-18 – 2020-02-20 (×3): 10 mL
  Administered 2020-02-20: 22:00:00 20 mL
  Administered 2020-02-21 (×2): 10 mL

## 2020-02-18 MED ORDER — ASPIRIN 81 MG PO CHEW
81.0000 mg | CHEWABLE_TABLET | Freq: Every day | ORAL | Status: DC
  Administered 2020-02-18 – 2020-02-22 (×5): 81 mg via ORAL
  Filled 2020-02-18 (×5): qty 1

## 2020-02-18 MED ORDER — CLOPIDOGREL BISULFATE 75 MG PO TABS
75.0000 mg | ORAL_TABLET | Freq: Every day | ORAL | Status: DC
  Administered 2020-02-18 – 2020-02-22 (×5): 75 mg via ORAL
  Filled 2020-02-18 (×5): qty 1

## 2020-02-18 MED ORDER — ORAL CARE MOUTH RINSE: 15.0000 mL | Freq: Two times a day (BID) | OROMUCOSAL | Status: DC

## 2020-02-18 NOTE — Progress Notes (Signed)
Peripherally Inserted Central Catheter Placement  The IV Nurse has discussed with the patient and/or persons authorized to consent for the patient, the purpose of this procedure and the potential benefits and risks involved with this procedure.  The benefits include less needle sticks, lab draws from the catheter, and the patient may be discharged home with the catheter. Risks include, but not limited to, infection, bleeding, blood clot (thrombus formation), and puncture of an artery; nerve damage and irregular heartbeat and possibility to perform a PICC exchange if needed/ordered by physician.  Alternatives to this procedure were also discussed.  Bard Power PICC patient education guide, fact sheet on infection prevention and patient information card has been provided to patient /or left at bedside.   Consent obtained via telephone with son Tasiana Haske   [facility] Placement Documentation  PICC Double Lumen [id number] PICC Right Basilic 37 cm 0 cm (Active)  Indication for Insertion or Continuance of Line Poor Vasculature-patient has had multiple peripheral attempts or PIVs lasting less than 24 hours 02/18/20 1600  Exposed Catheter (cm) 0 cm 02/18/20 1600  Site Assessment Clean;Dry;Intact 02/18/20 1600  Lumen #1 Status Flushed;Saline locked;Blood return noted 02/18/20 1600  Lumen #2 Status Flushed;Saline locked;Blood return noted 02/18/20 1600  Dressing Type Transparent;Securing device 02/18/20 1600  Dressing Status Clean;Dry;Intact;Antimicrobial disc in place 02/18/20 1600  Dressing Change Due 02/25/20 02/18/20 1600       Holley Bouche Brimley 02/18/2020, 4:27 PM

## 2020-02-18 NOTE — Procedures (Signed)
Extubation Procedure Note  Patient Details:   Name: Mallory Peters DOB: 10/16/1941 MRN: [id number]   Airway Documentation:    Vent end date: 02/18/20 Vent end time: 0825   Evaluation  O2 sats: stable throughout Complications: No apparent complications Patient did tolerate procedure well. Bilateral Breath Sounds: Rhonchi, Diminished   Yes   Pt was extubated per MD order and placed on 3 L [place]. Cuff leak was noted prior to extubation and no stridor was noted post extubation. Pt is stable at this time. RT will monitor.   Ronaldo Miyamoto 02/18/2020, 8:26 AM

## 2020-02-18 NOTE — Evaluation (Signed)
Clinical/Bedside Swallow Evaluation Patient Details  Name: Mallory Peters MRN: [id number] Date of Birth: [birth year]  Today's Date: 02/18/2020 Time: SLP Start Time (ACUTE ONLY): [id number] SLP Stop Time (ACUTE ONLY): 1149 SLP Time Calculation (min) (ACUTE ONLY): 15 min  Past Medical History:  Past Medical History:  Diagnosis Date  . Anginal pain ([place])   . Anxiety   . B12 deficiency   . Colon polyps   . CVD (cardiovascular disease)   . Dementia ([place])   . Depression   . Dysphagia   . Dyspnea   . Esophageal dysmotility   . Gastritis   . GERD (gastroesophageal reflux disease)   . Hemorrhoids   . HNP (herniated nucleus pulposus), lumbar   . Hyperlipidemia   . Hypertension   . Insomnia   . Lipids blood increased   . Meningioma ([place])   . Non-cardiac chest pain   . Osteoarthritis   . Polyp of cervix   . Stroke (cerebrum) [facility])    Past Surgical History:  Past Surgical History:  Procedure Laterality Date  . BIOPSY SHOULDER    . CHOLECYSTECTOMY    . colonic polyps    . COLONOSCOPY    . COLONOSCOPY WITH PROPOFOL N/A 04/28/2018   Procedure: COLONOSCOPY WITH PROPOFOL;  Surgeon: Manya Silvas, MD;  Location[facility] ENDOSCOPY;  Service: Endoscopy;  Laterality: N/A;  . ESOPHAGOGASTRODUODENOSCOPY (EGD) WITH PROPOFOL N/A 07/12/2016   Procedure: ESOPHAGOGASTRODUODENOSCOPY (EGD) WITH PROPOFOL;  Surgeon: Manya Silvas, MD;  Location: [facility] ENDOSCOPY;  Service: Endoscopy;  Laterality: N/A;  . SAVORY DILATION N/A 07/12/2016   Procedure: SAVORY DILATION;  Surgeon: Manya Silvas, MD;  Location: [facility] ENDOSCOPY;  Service: Endoscopy;  Laterality: N/A;  . TUBAL LIGATION     HPI:  Pt is a 79 yo female presenting with seizures in the setting of meningioma, requiring ETT 4/27-4/29. PMH includes CVA, meningioma, HLD, HTN, GERD, esophageal dysmotility, dementia, CAD, B12 deficiency. Esophagram in 2017 showed intermittent laryngeal penetration that cleared with a reflexive cough; presbyesophagus;  delayed relaxation of the LES; and small HH. Esophagram in 2016 showed a single episode of "massive aspiration" that also elicited coughing. BSE in 2018 with no overt s/s of aspiration and functional appearing swallow except for multiple swallows.   Assessment / Plan / Recommendation Clinical Impression  Pt has a h/o pharyngeal and esophageal dysphagia, with risk for aspiration increased in the setting of recent intubation. Her voice is almost aphonic initially, improving with onset of ice chips but still moderately dysphonic. Bilateral lesions are noted in the sides of her tongue, but she denies pain. Multiple swallows are noted across trials, but this is consistent with her baseline. Of more concern is the amount of throat clearing and coughing noted with intake. She clears her throat throughout most trials, in between them but also consistently after swallowing. Weak sounding coughs followed most of the trials of thin liquids. I think she would benefit from additional time post-extubation for some of her suspected laryngeal irritation to subside. In the meantime, it would likely benefit her to have some moisture in the form of ice chips after oral care. Could also give necessary PO meds, but would do so crushed in puree. SLP will f/u for potential to begin PO diet, hopefully noting some clinical improvements over the next 24-48 hours.  SLP Visit Diagnosis: Dysphagia, pharyngoesophageal phase (R13.14)    Aspiration Risk  Moderate aspiration risk    Diet Recommendation NPO except meds;Ice chips PRN after oral care   Medication  Administration: Crushed with puree Supervision: Full supervision/cueing for compensatory strategies    Other  Recommendations Oral Care Recommendations: Oral care prior to ice chip/H20;Oral care QID Other Recommendations: Have oral suction available   Follow up Recommendations (tba)      Frequency and Duration min 2x/week  2 weeks       Prognosis Prognosis for Safe  Diet Advancement: Good Barriers to Reach Goals: Cognitive deficits;Time post onset      Swallow Study   General HPI: Pt is a 79 yo female presenting with seizures in the setting of meningioma, requiring ETT 4/27-4/29. PMH includes CVA, meningioma, HLD, HTN, GERD, esophageal dysmotility, dementia, CAD, B12 deficiency. Esophagram in 2017 showed intermittent laryngeal penetration that cleared with a reflexive cough; presbyesophagus; delayed relaxation of the LES; and small HH. Esophagram in 2016 showed a single episode of "massive aspiration" that also elicited coughing. BSE in 2018 with no overt s/s of aspiration and functional appearing swallow except for multiple swallows. Type of Study: Bedside Swallow Evaluation Previous Swallow Assessment: see HPI Diet Prior to this Study: NPO Temperature Spikes Noted: Yes(100.4) Respiratory Status: Nasal cannula History of Recent Intubation: Yes Length of Intubations (days): 2 days Date extubated: 02/18/20 Behavior/Cognition: Alert;Cooperative;Distractible;Requires cueing Oral Cavity Assessment: Other (comment)(see clinical impressions) Oral Care Completed by SLP: Yes Oral Cavity - Dentition: Adequate natural dentition Self-Feeding Abilities: Needs assist Patient Positioning: Upright in bed Baseline Vocal Quality: Hoarse;Low vocal intensity Volitional Cough: Weak Volitional Swallow: Able to elicit    Oral/Motor/Sensory Function Overall Oral Motor/Sensory Function: (difficulty following commands but seems symmetrical)   Ice Chips Ice chips: Impaired Presentation: Spoon Pharyngeal Phase Impairments: Multiple swallows;Throat Clearing - Immediate   Thin Liquid Thin Liquid: Impaired Presentation: Cup;Spoon;Straw;Self Fed Pharyngeal  Phase Impairments: Multiple swallows;Throat Clearing - Immediate;Cough - Immediate    Nectar Thick Nectar Thick Liquid: Not tested   Honey Thick Honey Thick Liquid: Not tested   Puree Puree: Impaired Presentation:  Spoon Pharyngeal Phase Impairments: Multiple swallows;Throat Clearing - Immediate   Solid     Solid: Not tested       Osie Bond., M.A. [place] Pager [phone] Office [phone]  02/18/2020,1:10 PM

## 2020-02-18 NOTE — Progress Notes (Addendum)
Subjective: No acute events overnight.  Patient more awake this morning also has been extubated.  States that she is feeling cold and requesting for blankets.  Denies any other concerns.   ROS: Unable to obtain complete review of system due to poor mental status  Examination  Vital signs in last 24 hours: Temp:  [99.1 F (37.3 C)-100.2 F (37.9 C)] 99.7 F (37.6 C) (04/29 1020) Pulse Rate:  [52-98] 56 (04/29 1020) Resp:  [9-29] 19 (04/29 1020) BP: (75-139)/(42-91) 96/51 (04/29 1020) SpO2:  [89 %-100 %] 100 % (04/29 1020) FiO2 (%):  [30 %] 30 % (04/29 0744) Weight:  [57.1 kg] 57.1 kg (04/29 0500)  General: lying in bed, not in apparent distress CVS: pulse-normal rate and rhythm RS: breathing comfortably, CTA B Extremities: normal, warm  Neuro: MS: Was sleeping when I walked in the room, opened eyes to verbal stimuli, attempted to say something when asked orientation questions but did not appropriately answer any of my questions.  Did seem to follow simple one-step commands. CN: pupils equal and reactive, able to track me in the room, blinks to threat bilaterally, no apparent facial asymmetry  Motor: Moving all 4 extremities with antigravity strength  Basic Metabolic Panel: Recent Labs  Lab 02/16/20 1412 02/17/20 0426 02/17/20 0448 02/18/20 0314  NA 136 138 137 143  K 3.2* 3.6 3.6 3.6  CL 101  --  110 114*  CO2 13*  --  13* 21*  GLUCOSE 181*  --  120* 108*  BUN 12  --  <5* <5*  CREATININE 0.99  --  0.72 0.60  CALCIUM 9.2  --  8.3* 8.5*  MG  --   --  1.8 1.8  PHOS  --   --  2.8 2.2*    CBC: Recent Labs  Lab 02/16/20 1412 02/17/20 0426 02/17/20 0448 02/18/20 0314  WBC 15.1*  --  13.9* 15.1*  NEUTROABS 10.7*  --   --   --   HGB 14.1 12.2 12.6 12.3  HCT 44.3 36.0 39.9 38.2  MCV 87.4  --  89.1 87.4  PLT 296  --  205 229     Coagulation Studies: Recent Labs    02/16/20 1412  LABPROT 13.7  INR 1.1    Imaging  MRI brain without contrast 02/17/2020:  Restricted diffusion and T2 hyperintensity within the medial left temporal lobe/hippocampus and adjacent left fornix with mild associated parenchymal swelling. Similar although more subtle signal changes are questioned within the medial right temporal lobe/hippocampus. Findings may reflect seizure related changes. This imaging appearance can also be seen in setting of HSV encephalitis and clinical correlation is recommended. Additionally, MRI follow-up is recommended following a period of seizure control to ensure resolution of signal abnormality at these sites.  Stable moderate generalized parenchymal atrophy and chronic small vessel ischemic disease.  Redemonstrated 1.2 cm partially calcified meningioma overlying the left frontal lobe convexity.        ASSESSMENT AND PLAN: 79 year old female who presented with seizures in setting of meningioma.    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  However patient continues to improve, therefore low suspicion.  Recommendations -Continue Keppra 1000 mg twice daily for now.   -Recommend repeat MRI brain without contrast in 8 to 12 weeks (preferably before follow-up with neurologist) to look for resolution of MRI changes -Per ICU team, will discontinue acyclovir if HSV PCR  comes back negative. -Continue seizure precautions -As needed IV Ativan for generalized tonic-clonic seizure lasting more than 2 minutes of focal seizure lasting more than 5 minutes -Management of rest of the comorbidities per primary team -Spoke with son and updated yesterday afternoon. -Recommend follow-up with neurology at [facility] clinic with Dr. Gurney Maxin about 8 to 12 weeks after discharge.   Seizure precautions: Per [facility] statutes, patients with seizures are not  allowed to drive until they have been seizure-free for six months and cleared by a physician    Use caution when using heavy equipment or power tools. Avoid working on ladders or at heights. Take showers instead of baths. Ensure the water temperature is not too high on the home water heater. Do not go swimming alone. Do not lock yourself in a room alone (i.e. bathroom). When caring for infants or small children, sit down when holding, feeding, or changing them to minimize risk of injury to the child in the event you have a seizure. Maintain good sleep hygiene. Avoid alcohol.    If patient has another seizure, call 911 and bring them back to the ED if: A.  The seizure lasts longer than 5 minutes.      B.  The patient doesn't wake shortly after the seizure or has new problems such as difficulty seeing, speaking or moving following the seizure C.  The patient was injured during the seizure D.  The patient has a temperature over 102 F (39C) E.  The patient vomited during the seizure and now is having trouble breathing    During the Seizure   - First, ensure adequate ventilation and place patients on the floor on their left side  Loosen clothing around the neck and ensure the airway is patent. If the patient is clenching the teeth, do not force the mouth open with any object as this can cause severe damage - Remove all items from the surrounding that can be hazardous. The patient may be oblivious to what's happening and may not even know what he or she is doing. If the patient is confused and wandering, either gently guide him/her away and block access to outside areas - Reassure the individual and be comforting - Call 911. In most cases, the seizure ends before EMS arrives. However, there are cases when seizures may last over 3 to 5 minutes. Or the individual may have developed breathing difficulties or severe injuries. If a pregnant patient or a person with diabetes develops a seizure, it is prudent  to call an ambulance. - Finally, if the patient does not regain full consciousness, then call EMS. Most patients will remain confused for about 45 to 90 minutes after a seizure, so you must use judgment in calling for help. - Avoid restraints but make sure the patient is in a bed with padded side rails - Place the individual in a lateral position with the neck slightly flexed; this will help the saliva drain from the mouth and prevent the tongue from falling backward - Remove all nearby furniture and other hazards from the area - Provide verbal assurance as the individual is regaining consciousness - Provide the patient with privacy if possible - Call for help and start treatment as ordered by the caregiver    After the Seizure (Postictal Stage)   After a seizure, most patients experience confusion, fatigue, muscle pain and/or a headache. Thus, one should permit the individual to sleep. For the next few days, reassurance is essential. Being  calm and helping reorient the person is also of importance.   Most seizures are painless and end spontaneously. Seizures are not harmful to others but can lead to complications such as stress on the lungs, brain and the heart. Individuals with prior lung problems may develop labored breathing and respiratory distress.    I have spent a total of 35 minutes with the patient reviewing hospital notes,  test results, labs and examining the patient as well as establishing an assessment and plan that was discussed personally with the patient's family and team.  > 50% of time was spent in direct patient care.    Zeb Comfort Epilepsy [facility] For questions after 5pm please refer to AMION to reach the Neurologist on call

## 2020-02-18 NOTE — Progress Notes (Signed)
Lost all IV access, unable to administer medications due to waiting for PICC line placement. Delay due to pt's son being unable to give consent while driving. MD Agarwala aware. Will continue to monitor.

## 2020-02-18 NOTE — Evaluation (Signed)
Physical Therapy Evaluation Patient Details Name: CELITA HEGGER MRN: [id number] DOB: 14-Sep-1941 Today's Date: 02/18/2020   History of Present Illness  79 yo female found by son at home.  Reported have 20 minutes tonic/clonic seizure after [facility] arrived.  PMH includes CVA. Osteoarthritis, Meningioma, HLD, HTN, Insomnia, GERD, Esophageal dysmotility, Depression, Dementia, CAD, Colon polyps, B12 deficiency, Anxiety.  Intubated for airway protection. Pt also with signs of septic shock.  Clinical Impression  Pt presents to PT with deficits in functional mobility, strength, power, balance, endurance, gait. Pt requiring physical assistance to perform all functional mobility this session. Pt is mildly lethargic, following all commands but with slowed cognition and maintaining eyes closed at times during mobility. PT needs to confirm history with family as pt has a history of cognitive deficits per chart review and reports living alone and being independent. Pt with strong posterior lean during standing and OOB activities which greatly increases her risk for falls. Pt will benefit from acute PT POC to reduce falls risk and improve mobility.    Follow Up Recommendations SNF;Supervision/Assistance - 24 hour(may progress to CIR with improve activity tolerance)    Equipment Recommendations  Wheelchair (measurements PT);Wheelchair cushion (measurements PT);Hospital bed(mechanical lift, if home today)    Recommendations for Other Services       Precautions / Restrictions Precautions Precautions: Fall Precaution Comments: seizure Restrictions Weight Bearing Restrictions: No      Mobility  Bed Mobility Overal bed mobility: Needs Assistance Bed Mobility: Supine to Sit;Sit to Supine     Supine to sit: Mod assist Sit to supine: Total assist      Transfers Overall transfer level: Needs assistance Equipment used: 1 person hand held assist Transfers: Sit to/from Stand Sit to Stand: Max  assist         General transfer comment: pt with posterior lean, does respond to verbal cues to lean anteriorly  Ambulation/Gait Ambulation/Gait assistance: Mod assist Gait Distance (Feet): 0 Feet Assistive device: 1 person hand held assist Gait Pattern/deviations: Shuffle Gait velocity: 0 Gait velocity interpretation: <1.31 ft/sec, indicative of household ambulator General Gait Details: pt able to clear feet minimally when marching in place, PT providing support due to posterior lean  Stairs            Wheelchair Mobility    Modified Rankin (Stroke Patients Only)       Balance Overall balance assessment: Needs assistance Sitting-balance support: Bilateral upper extremity supported;Feet unsupported Sitting balance-Leahy Scale: Poor Sitting balance - Comments: minA for static sitting at edge of bed Postural control: Posterior lean Standing balance support: Bilateral upper extremity supported Standing balance-Leahy Scale: Zero Standing balance comment: mod-maxA to maintain static standing                             Pertinent Vitals/Pain Pain Assessment: No/denies pain    Home Living Family/patient expects to be discharged to:: Private residence Living Arrangements: Alone Available Help at Discharge: Family;Available PRN/intermittently(pt reports mother, later states son when PT asks about son) Type of Home: House Home Access: Stairs to enter Entrance Stairs-Rails: Can reach both Entrance Stairs-Number of Steps: 2 Home Layout: One level Home Equipment: Walker - 2 wheels Additional Comments: need to confirm history with family 2/2 AMS    Prior Function Level of Independence: Independent         Comments: pt reports living independently, no use of DME. Pt with hx of dementia, need to confirm PLOF  Hand Dominance        Extremity/Trunk Assessment   Upper Extremity Assessment Upper Extremity Assessment: Generalized weakness    Lower  Extremity Assessment Lower Extremity Assessment: Generalized weakness    Cervical / Trunk Assessment Cervical / Trunk Assessment: Kyphotic  Communication   Communication: No difficulties  Cognition Arousal/Alertness: Awake/alert(awakens with verbal cues) Behavior During Therapy: Flat affect Overall Cognitive Status: History of cognitive impairments - at baseline                                 General Comments: pt follows commands well, not oriented to time, demonstrates limited awareness of deficits      General Comments General comments (skin integrity, edema, etc.): pt with soft BP, 84/47 pre-mobility, 103/60 with sitting, 95/54 when back in supine. Pt on 2L [place] with stable HR and SpO2    Exercises     Assessment/Plan    PT Assessment Patient needs continued PT services  PT Problem List Decreased strength;Decreased activity tolerance;Decreased balance;Decreased mobility;Decreased cognition;Decreased knowledge of use of DME;Decreased safety awareness;Decreased knowledge of precautions;Cardiopulmonary status limiting activity       PT Treatment Interventions DME instruction;Gait training;Stair training;Functional mobility training;Therapeutic activities;Therapeutic exercise;Balance training;Neuromuscular re-education;Cognitive remediation;Patient/family education    PT Goals (Current goals can be found in the Care Plan section)  Acute Rehab PT Goals Patient Stated Goal: To improve mobility PT Goal Formulation: With patient Time For Goal Achievement: 03/03/20 Potential to Achieve Goals: Fair Additional Goals Additional Goal #1: Pt will maintain dynamic standing balance within 6 inches of her base of support with UE support of the LRAD and minG    Frequency Min 3X/week   Barriers to discharge        Co-evaluation               AM-PAC PT "6 Clicks" Mobility  Outcome Measure Help needed turning from your back to your side while in a flat bed without  using bedrails?: A Lot Help needed moving from lying on your back to sitting on the side of a flat bed without using bedrails?: A Lot Help needed moving to and from a bed to a chair (including a wheelchair)?: Total Help needed standing up from a chair using your arms (e.g., wheelchair or bedside chair)?: Total Help needed to walk in hospital room?: Total Help needed climbing 3-5 steps with a railing? : Total 6 Click Score: 8    End of Session Equipment Utilized During Treatment: Oxygen Activity Tolerance: Patient tolerated treatment well Patient left: in bed;with call bell/phone within reach;with bed alarm set Nurse Communication: Mobility status PT Visit Diagnosis: Muscle weakness (generalized) (M62.81);Other symptoms and signs involving the nervous system (R29.898)    Time: 1730-1745 PT Time Calculation (min) (ACUTE ONLY): 15 min   Charges:   PT Evaluation $PT Eval Moderate Complexity: 1 Mod          Zenaida Niece, PT, DPT Acute Rehabilitation Pager: [phone]   Zenaida Niece 02/18/2020, 6:01 PM

## 2020-02-18 NOTE — Patient Care Conference (Cosign Needed)
Meeting Coordinated and held in ward conference room  In attendance: Dr. Kipp Brood, Nancy Nordmann MS4  Patient representative: Mallory Peters (son) and Mallory Peters (daughter)  Patient status was communicated to pt reps with comprehension. Goals of coordination included: -Maintain a more extensive and concise patient history due to inability of patient to state in acute encephalopathic condition and long standing history of dementia.  -Inform family of pts current condition, indications of management and considerations of possible requirements of care going forward.   All parties were able to communicate and both pt reps were able to provide unified history of pts medical conditions as well as demonstrate understanding and approval of current management of patient.    William Dalton [facility] 02/18/20

## 2020-02-18 NOTE — Progress Notes (Addendum)
NAME:  Mallory Peters, MRN:  [id number], DOB:  March 25, 1941, LOS: 2 ADMISSION DATE:  02/16/2020, CONSULTATION DATE:  02/16/20 REFERRING MD:  Joan Mayans, CHIEF COMPLAINT:  seizures  Brief History   79 yo female found by son at home.  Reported have 20 minutes tonic/clonic seizure after [facility] arrived.  Hx of insomnia on chronic xanax.  Intubated for airway protection.  Transfer to [facility] for further neuro assessment.  Past Medical History  CVA. Osteoarthritis, Meningioma, HLD, HTN, Insomnia, GERD, Esophageal dysmotility, Depression, Dementia, CAD, Colon polyps, B12 deficiency, Anxiety  Significant Hospital Events   4/27 transfer from [facility] to [facility] 4/28 remains encphalopathic. MRI with temporal lobe swelling--seizure vs encephalitis 4/29 improved mental status. extubated  Consults:  Neurology  Procedures:  ETT 4/27>4/29  Significant Diagnostic Tests:  4/27 head CT> 1.2cm left frontal meningioma with possible trace vasogenic edema 4/28 MRI brain> restricted diffusion and T2 hyperintensity within the medial left temporal lobe/hippocampus associated with swelling. More subtle similar findings in the right temporal lobe. Seizure vs HSV encephalitis  Micro Data:  4/27 CSF>NGTD 4/27 BLOOD CULTURES>NGTD 4/28 RESP CULTURE> no organisms present Antimicrobials:  Rocephin 4/27 Acyclovir 4/27  Interim history/subjective:  Mental status improved this morning. Extubated.  Objective   Blood pressure 109/61, pulse 75, temperature 99.3 F (37.4 C), resp. rate (!) 25, height 5\' 2"  (1.575 m), weight 57.1 kg, SpO2 100 %.    Vent Mode: PSV;CPAP FiO2 (%):  [30 %] 30 % Set Rate:  [15 bmp] 15 bmp Vt Set:  [400 mL] 400 mL PEEP:  [5 cmH20] 5 cmH20 Pressure Support:  [5 cmH20-10 cmH20] 5 cmH20 Plateau Pressure:  [14 cmH20] 14 cmH20   Intake/Output Summary (Last 24 hours) at 02/18/2020 0806 Last data filed at 02/18/2020 0700 Gross per 24 hour  Intake 4055.58 ml  Output 900 ml  Net 3155.58 ml   Filed  Weights   02/17/20 0306 02/18/20 0500  Weight: 58.8 kg 57.1 kg    Examination: General: elderly female in NAD HENT: [place] Lungs: breathing comfortably on [place]. Good cough. No adventitious lung sounds bilaterally. Cardiovascular: tachycardic rate, regular rhythm Abdomen: bs active Extremities: warm Neuro: alert. Following commands.moving all extremities. Skin: no rash  Resolved Hospital Problem list   n/a  Assessment & Plan:   Acute encephalopathy with seizures 4/28 MRI with temporal lobe changes--seizure vs HSV.  No growth on CSF cultures yet. Afebrile overnight. White count elevated but stable from 4/28. Procalc remains wnl. EEG negative for seizure More alert this morning and is following commands. Neurology on board Plan -continue acyclovir until HSV PCR returns -continue keppra -f/u TSH, B12 levels  Shock. Suspect septic. Lactate elevated on admission. Blood, respiratory, CSF cultures remain without growth. Decreasing pressor support. Suspect this may continue to improve with extubation. Plan -Continue to follow cultures -Wean neo as able -Continue rocephin  Respiratory insufficiency requiring mechanical ventilation. More alert this morning and pulling on ETT. Will extubate today and continue to monitor. Swallow study  Best practice:  Diet: NPO Pain/Anxiety/Delirium protocol (if indicated): try to hold sedation for better neuro status exam VAP protocol (if indicated): extubated DVT prophylaxis: Lovenox GI prophylaxis: n/a Glucose control: n/a Mobility: OOB Code Status: full Family Communication: will update daughter Disposition: ICU  Labs   CBC: Recent Labs  Lab 02/16/20 1412 02/17/20 0426 02/17/20 0448 02/18/20 0314  WBC 15.1*  --  13.9* 15.1*  NEUTROABS 10.7*  --   --   --   HGB 14.1 12.2 12.6 12.3  HCT  44.3 36.0 39.9 38.2  MCV 87.4  --  89.1 87.4  PLT 296  --  205 [id number]    Basic Metabolic Panel: Recent Labs  Lab 02/16/20 1412 02/17/20 0426  02/17/20 0448 02/18/20 0314  NA 136 138 137 143  K 3.2* 3.6 3.6 3.6  CL 101  --  110 114*  CO2 13*  --  13* 21*  GLUCOSE 181*  --  120* 108*  BUN 12  --  <5* <5*  CREATININE 0.99  --  0.72 0.60  CALCIUM 9.2  --  8.3* 8.5*  MG  --   --  1.8 1.8  PHOS  --   --  2.8 2.2*   GFR: Estimated Creatinine Clearance: 45.1 mL/min (by C-G formula based on SCr of 0.6 mg/dL). Recent Labs  Lab 02/16/20 1401 02/16/20 1409 02/16/20 1412 02/16/20 1615 02/17/20 0448 02/17/20 1033 02/18/20 0314  PROCALCITON  --  <0.10  --   --   --  0.19 0.15  WBC  --   --  15.1*  --  13.9*  --  15.1*  LATICACIDVEN >11.0*  --   --  3.6*  --  2.0*  --     Liver Function Tests: Recent Labs  Lab 02/16/20 1412  AST 27  ALT 15  ALKPHOS 76  BILITOT 0.9  PROT 7.6  ALBUMIN 4.6   No results for input(s): LIPASE, AMYLASE in the last 168 hours. No results for input(s): AMMONIA in the last 168 hours.  ABG    Component Value Date/Time   PHART 7.330 (L) 02/17/2020 0426   PCO2ART 35.8 02/17/2020 0426   PO2ART 91 02/17/2020 0426   HCO3 18.8 (L) 02/17/2020 0426   TCO2 20 (L) 02/17/2020 0426   ACIDBASEDEF 6.0 (H) 02/17/2020 0426   O2SAT 96.0 02/17/2020 0426     Coagulation Profile: Recent Labs  Lab 02/16/20 1412  INR 1.1    Cardiac Enzymes: Recent Labs  Lab 02/16/20 1409  CKTOTAL 40    HbA1C: Hgb A1c MFr Bld  Date/Time Value Ref Range Status  02/17/2020 04:48 AM 5.5 4.8 - 5.6 % Final    Comment:    (NOTE) Pre diabetes:          5.7%-6.4% Diabetes:              >6.4% Glycemic control for   <7.0% adults with diabetes      Mitzi Hansen, MD INTERNAL MEDICINE RESIDENT PGY-1 02/18/20  8:06 AM

## 2020-02-18 NOTE — Progress Notes (Signed)
Telephone call to son to obtain consent for PICC.  Son stated that he was driving and would like to have the discussion face to face since he has a lot of questions.  Son made aware that PICC team would have to move on to the next patient at this time and return back to this patient.  Son stated that was fine because he could not give consent at this time.

## 2020-02-18 NOTE — Progress Notes (Signed)
eLink Physician-Brief Progress Note Patient Name: Mallory Peters DOB: [birth year] MRN: [id number]   Date of Service  02/18/2020  HPI/Events of Note  Request to transfer the patient to a progressive (stepdown) bed to create an ICU bed for a more critically ill patient. . Patient is stable off vasopressors and on [place] O2.  eICU Interventions  Will transfer the patient to a progressive care bed.      Intervention Category Major Interventions: Other:  Lysle Dingwall 02/18/2020, 10:56 PM

## 2020-02-18 NOTE — Plan of Care (Signed)
  Problem: Clinical Measurements: Goal: Respiratory complications will improve Outcome: Progressing Goal: Cardiovascular complication will be avoided Outcome: Progressing   Problem: Coping: Goal: Level of anxiety will decrease Outcome: Progressing   Problem: Pain Managment: Goal: General experience of comfort will improve Outcome: Progressing   Problem: Safety: Goal: Ability to remain free from injury will improve Outcome: Progressing   Problem: Skin Integrity: Goal: Risk for impaired skin integrity will decrease Outcome: Progressing

## 2020-02-19 DIAGNOSIS — R569 Unspecified convulsions: Secondary | ICD-10-CM

## 2020-02-19 LAB — CULTURE, BLOOD (ROUTINE X 2): Special Requests: ADEQUATE

## 2020-02-19 LAB — CULTURE, RESPIRATORY W GRAM STAIN: Culture: NORMAL

## 2020-02-19 LAB — PROCALCITONIN: Procalcitonin: <0.1 ng/mL

## 2020-02-19 LAB — GLUCOSE, CAPILLARY
Glucose-Capillary: 77 mg/dL (ref 70–99)
Glucose-Capillary: 77 mg/dL (ref 70–99)
Glucose-Capillary: 87 mg/dL (ref 70–99)
Glucose-Capillary: 101 mg/dL — ABNORMAL HIGH (ref 70–99)
Glucose-Capillary: 92 mg/dL (ref 70–99)

## 2020-02-19 LAB — PHOSPHORUS: Phosphorus: 1.2 mg/dL — ABNORMAL LOW (ref 2.5–4.6)

## 2020-02-19 MED ORDER — LORAZEPAM 2 MG/ML IJ SOLN
2.0000 mg | INTRAMUSCULAR | Status: DC | PRN
  Filled 2020-02-19: qty 1

## 2020-02-19 MED ORDER — HALOPERIDOL LACTATE 5 MG/ML IJ SOLN
4.0000 mg | Freq: Four times a day (QID) | INTRAMUSCULAR | Status: DC | PRN
  Administered 2020-02-19: 4 mg via INTRAVENOUS
  Filled 2020-02-19: qty 1

## 2020-02-19 MED ORDER — ADULT MULTIVITAMIN W/MINERALS CH
1.0000 | ORAL_TABLET | Freq: Every day | ORAL | Status: DC
  Administered 2020-02-19 – 2020-02-22 (×4): 1 via ORAL
  Filled 2020-02-19 (×4): qty 1

## 2020-02-19 MED ORDER — GLUCERNA SHAKE PO LIQD
237.0000 mL | Freq: Two times a day (BID) | ORAL | Status: DC
  Administered 2020-02-19 – 2020-02-22 (×4): 237 mL via ORAL

## 2020-02-19 MED ORDER — POTASSIUM PHOSPHATES 15 MMOLE/5ML IV SOLN
30.0000 mmol | Freq: Once | INTRAVENOUS | Status: AC
  Administered 2020-02-19: 30 mmol via INTRAVENOUS
  Filled 2020-02-19: qty 10

## 2020-02-19 NOTE — Progress Notes (Signed)
Physical Therapy Treatment Patient Details Name: Mallory Peters MRN: [id number] DOB: 06/08/1941 Today's Date: 02/19/2020    History of Present Illness 79 yo female found by son at home.  Reported have 20 minutes tonic/clonic seizure after [facility] arrived.  PMH includes CVA. Osteoarthritis, Meningioma, HLD, HTN, Insomnia, GERD, Esophageal dysmotility, Depression, Dementia, CAD, Colon polyps, B12 deficiency, Anxiety.  Intubated for airway protection. Pt also with signs of septic shock.    PT Comments    Pt found trying to exit bed to use BSC on entry with bed alarm sounding. Pt redirected for safety as IV pole and BSC move to safe position. Pt is limited in safe mobility by decrease cognition especially poor safety awareness in presence of generalized weakness. Pt is currently mod A for bed mobility, modAx2 for transfers and modA for ambulation of 6 feet with HHA as pt refused RW. Pt son present at end of session and reports she has 24 hour supervision at home and assist with ADLs. Given level of support at home and pt history of dementia, PT changing recommendation to HHPT with 24 hour supervision, as pt will most likely be less confused in her home environment. PT will continue to follow acutely.   Follow Up Recommendations  Supervision/Assistance - 24 hour;Home health PT     Equipment Recommendations  None recommended by PT(has RW at home)       Precautions / Restrictions Precautions Precautions: Fall Precaution Comments: seizure Restrictions Weight Bearing Restrictions: No    Mobility  Bed Mobility Overal bed mobility: Needs Assistance Bed Mobility: Supine to Sit     Supine to sit: Mod assist Sit to supine: Total assist   General bed mobility comments: pt with LEs towards EOB trying to get OOB unassisted upon arrival  Transfers Overall transfer level: Needs assistance Equipment used: 2 person hand held assist Transfers: Sit to/from Stand Sit to Stand: Mod assist;Min  assist         General transfer comment: mod A +2 for initial stand from EOB, min A from BSC, posterior lean, poor safety awareness  Ambulation/Gait Ambulation/Gait assistance: Mod assist Gait Distance (Feet): 6 Feet Assistive device: 1 person hand held assist Gait Pattern/deviations: Shuffle Gait velocity: 0 Gait velocity interpretation: <1.8 ft/sec, indicate of risk for recurrent falls General Gait Details: modA for steadying with shuffling gait from [facility] to recliner         Balance Overall balance assessment: Needs assistance Sitting-balance support: Bilateral upper extremity supported;Feet unsupported Sitting balance-Leahy Scale: Poor Sitting balance - Comments: minA for static sitting at edge of bed Postural control: Posterior lean Standing balance support: Bilateral upper extremity supported Standing balance-Leahy Scale: Poor                              Cognition Arousal/Alertness: Awake/alert(awakens with verbal cues) Behavior During Therapy: Flat affect Overall Cognitive Status: History of cognitive impairments - at baseline                                 General Comments: pt follows commands well, not oriented to time, demonstrates limited awareness of deficits         General Comments General comments (skin integrity, edema, etc.): VSS SaO2 95%O2 on RA       Pertinent Vitals/Pain Pain Assessment: Faces Faces Pain Scale: Hurts a little bit Pain Location: generalized Pain Descriptors / Indicators:  Discomfort Pain Intervention(s): Monitored during session;Repositioned    Home Living Family/patient expects to be discharged to:: Private residence Living Arrangements: Alone Available Help at Discharge: Family;Available PRN/intermittently Type of Home: House Home Access: Stairs to enter Entrance Stairs-Rails: Can reach both Home Layout: One level Home Equipment: Walker - 2 wheels Additional Comments: need to confirm history  with family 2/2 AMS    Prior Function Level of Independence: Independent      Comments: pt reports living independently, no use of DME. Pt with hx of dementia, need to confirm PLOF   PT Goals (current goals can now be found in the care plan section) Acute Rehab PT Goals Patient Stated Goal: To improve mobility PT Goal Formulation: With patient Time For Goal Achievement: 03/03/20 Potential to Achieve Goals: Fair Progress towards PT goals: Progressing toward goals    Frequency    Min 3X/week      PT Plan Discharge plan needs to be updated    Co-evaluation PT/OT/SLP Co-Evaluation/Treatment: Yes Reason for Co-Treatment: Necessary to address cognition/behavior during functional activity PT goals addressed during session: Mobility/safety with mobility        AM-PAC PT "6 Clicks" Mobility   Outcome Measure  Help needed turning from your back to your side while in a flat bed without using bedrails?: A Lot Help needed moving from lying on your back to sitting on the side of a flat bed without using bedrails?: A Lot Help needed moving to and from a bed to a chair (including a wheelchair)?: Total Help needed standing up from a chair using your arms (e.g., wheelchair or bedside chair)?: Total Help needed to walk in hospital room?: Total Help needed climbing 3-5 steps with a railing? : Total 6 Click Score: 8    End of Session Equipment Utilized During Treatment: Gait belt Activity Tolerance: Patient tolerated treatment well Patient left: with call bell/phone within reach;in chair;with chair alarm set;with family/visitor present Nurse Communication: Mobility status PT Visit Diagnosis: Muscle weakness (generalized) (M62.81);Other symptoms and signs involving the nervous system [id number])     Time: [id number] PT Time Calculation (min) (ACUTE ONLY): 25 min  Charges:  $Gait Training: 8-22 mins                     Leondra Cullin B. Migdalia Dk PT, DPT Acute Rehabilitation  Services Pager [phone] Office [phone]    [place] 02/19/2020, 2:25 PM

## 2020-02-19 NOTE — Progress Notes (Signed)
Nutrition Follow-up  DOCUMENTATION CODES:   Not applicable  INTERVENTION:   -Glucerna Shake po BID, each supplement provides 220 kcal and 10 grams of protein -MVI with minerals daily -Magic cup TID with meals, each supplement provides 290 kcal and 9 grams of protein  NUTRITION DIAGNOSIS:   Inadequate oral intake related to inability to eat as evidenced by NPO status.  Progressing; advanced to dysphagia 3 diet with thin liquids  GOAL:   Patient will meet greater than or equal to 90% of their needs  Progressing   MONITOR:   PO intake, Supplement acceptance, Diet advancement, Labs, Weight trends, Skin, I & O's  REASON FOR ASSESSMENT:   Ventilator    ASSESSMENT:   79 yo female admitted with recurrent seizure. Required intubation. PMH includes CVA, meningioma, HLD, HTN, GERD, esophageal dysmotility, dementia, CAD, B12 deficiency.  4/29- extubated, s/p BSE_ recommend continue NPO 4/30- advanced to dysphagia 3 diet with thin liquids  Reviewed I/O's: +1.2 L x 24 hours and +4.2 L since admission  UOP: 1.7 L x 24 hours  Pt resting quietly at time of visit. She did not respond to voice.   SLP assessed after RD visit; advanced to a dysphagia 3 diet with thin liquids. No meal completion data available at this time.   Medications reviewed and include lactated ringers infusion @ 50 ml/hr.   Labs reviewed: CBGS: 77-87 (inpatient orders for glycemic control are 0-15 units insulin aspart every 4 hours).   Diet Order:   Diet Order            DIET DYS 3 Room service appropriate? Yes; Fluid consistency: Thin  Diet effective now              EDUCATION NEEDS:   Not appropriate for education at this time  Skin:  Skin Assessment: Reviewed RN Assessment  Last BM:  02/19/20  Height:   Ht Readings from Last 1 Encounters:  02/17/20 5\' 2"  (1.575 m)    Weight:   Wt Readings from Last 1 Encounters:  02/19/20 60.1 kg    Ideal Body Weight:  50 kg  BMI:  Body mass  index is 24.23 kg/m.  Estimated Nutritional Needs:   Kcal:  1600-1800  Protein:  80-95 grams  Fluid:  > 1.6 L    Loistine Chance, RD, LDN, [place] Registered Dietitian II Certified Diabetes Care and Education Specialist Please refer to [facility] for RD and/or RD on-call/weekend/after hours pager

## 2020-02-19 NOTE — Progress Notes (Signed)
   NAME:  Mallory Peters, MRN:  [id number], DOB:  [birth year], LOS: 3 ADMISSION DATE:  02/16/2020, CONSULTATION DATE:  02/16/20 REFERRING MD:  Joan Mayans, CHIEF COMPLAINT:  seizures  Brief History   79 yo female found by son at home.  Reported have 20 minutes tonic/clonic seizure after [facility] arrived.  Hx of insomnia on chronic xanax.  Intubated for airway protection.  Transfer to [facility] for further neuro assessment.  Past Medical History  CVA. Osteoarthritis, Meningioma, HLD, HTN, Insomnia, GERD, Esophageal dysmotility, Depression, Dementia, CAD, Colon polyps, B12 deficiency, Anxiety  Significant Hospital Events   4/27 transfer from [facility] to [facility] 4/28 remains encphalopathic. MRI with temporal lobe swelling--seizure vs encephalitis 4/29 improved mental status. extubated  Consults:  Neurology  Procedures:  ETT 4/27>4/29  Significant Diagnostic Tests:  4/27 head CT> 1.2cm left frontal meningioma with possible trace vasogenic edema 4/28 MRI brain> restricted diffusion and T2 hyperintensity within the medial left temporal lobe/hippocampus associated with swelling. More subtle similar findings in the right temporal lobe. Seizure vs HSV encephalitis  Micro Data:  4/27 CSF>NGTD 4/27 BLOOD CULTURES>NGTD 4/28 RESP CULTURE> no organisms present Antimicrobials:  Rocephin 4/27 Acyclovir 4/27-->  Interim history/subjective:  Thinks she is at home. No complaints   Objective   Blood pressure 106/61, pulse 87, temperature 98.4 F (36.9 C), temperature source Oral, resp. rate 16, height 5\' 2"  (1.575 m), weight 60.1 kg, SpO2 97 %.        Intake/Output Summary (Last 24 hours) at 02/19/2020 0833 Last data filed at 02/19/2020 0700 Gross per 24 hour  Intake 2736.66 ml  Output 1700 ml  Net 1036.66 ml   Filed Weights   02/17/20 0306 02/18/20 0500 02/19/20 0400  Weight: 58.8 kg 57.1 kg 60.1 kg    Examination: General this is a 79 year old white female resting comfortably in bed HENT NCAT no  JVD Pulm clear  Card RRR abd not tender Ext warm and dry Neuro awake moves all ext but confused.   Resolved Hospital Problem list   Shock resolved. (favor medication induced over sepsis) Respiratory failure resolved  Assessment & Plan:   Acute encephalopathy with seizures superimposed on baseline dementia.  Initially though HSV encephalitis vs left frontal meningioma w/ cerebral edema (favor this as herpes PCR neg) 4/28 MRI with temporal lobe changes--seizure vs HSV.  No growth on CSF cultures yet. Plan keppra 1000mg  bid Will need repeat MRI brain 8-12 weeks prior to follow up neurology appointment Dc acyclovir (herpes PCR was neg)  Circulatory Shock-->resolved. Doubt sepsis. All cultures negative Plan Stop abx  Dysphagia Plan Per SLP  Hypophosphatemia Plan Replace    Best practice:  Diet: NPO Pain/Anxiety/Delirium protocol (if indicated): try to hold sedation for better neuro status exam VAP protocol (if indicated): extubated DVT prophylaxis: Lovenox GI prophylaxis: n/a Glucose control: n/a Mobility: OOB Code Status: full Family Communication: will update daughter Disposition: to IM   Erick Colace ACNP-BC [place] Pager # [phone] OR # [phone] if no answer

## 2020-02-19 NOTE — TOC Initial Note (Signed)
Transition of Care [facility]) - Initial/Assessment Note    Patient Details  Name: Mallory Peters MRN: [id number] Date of Birth: [birth year]  [facility] CM/SW Contact:    Alberteen Sam, LCSW Phone Number: 02/19/2020, 2:35 PM  Clinical Narrative:                  CSW spoke with patient's daughter [name] regarding initial SNF rec made by PT or Home Health services recommended by OT.    [name] reports she works in [employer] and even though home health services would be covered by [employer], she reports she would not like her insurance to pay for home health services if it was not mandatory and they are declining all services at this time.   Patient will discharge home with zero services per family request.   Expected Discharge Plan: Home/Self Care Barriers to Discharge: Continued Medical Work up   Patient Goals and CMS Choice   CMS Medicare.gov Compare Post Acute Care list provided to:: Patient Represenative (must comment)(daughter [name]) Choice offered to / list presented to : Adult Children  Expected Discharge Plan and Services Expected Discharge Plan: Home/Self Care       Living arrangements for the past 2 months: Single Family Home                                      Prior Living Arrangements/Services Living arrangements for the past 2 months: Single Family Home Lives with:: Self Patient language and need for interpreter reviewed:: Yes Do you feel safe going back to the place where you live?: Yes      Need for Family Participation in Patient Care: Yes (Comment) Care giver support system in place?: Yes (comment)   Criminal Activity/Legal Involvement Pertinent to Current Situation/Hospitalization: No - Comment as needed  Activities of Daily Living      Permission Sought/Granted Permission sought to share information with : Case Manager, Customer service manager, Family Supports Permission granted to share information  with : Yes, Verbal Permission Granted  Share Information with NAME: [name]     Permission granted to share info w Relationship: daughter  Permission granted to share info w Contact Information: [phone]  Emotional Assessment Appearance:: Appears stated age     Orientation: : Oriented to Self Alcohol / Substance Use: Not Applicable Psych Involvement: No (comment)  Admission diagnosis:  Seizures ([place]) [R56.9] Patient Active Problem List   Diagnosis Date Noted  . Seizures ([place]) 02/16/2020  . Severe major depression, single episode, without psychotic features ([place]) 03/11/2017  . Dementia of the Alzheimer's type ([place]) 03/11/2017   PCP:  Idelle Crouch, MD Pharmacy:   [place], [address] [place] [address] Phone: [phone] Fax: [phone]  [place], [place] [address] Phone: [phone] Fax: [phone]     Social Determinants of Health (SDOH) Interventions    Readmission Risk Interventions No flowsheet data found.

## 2020-02-19 NOTE — Evaluation (Signed)
Occupational Therapy Evaluation Patient Details Name: Mallory Peters MRN: [id number] DOB: 15-Jun-1941 Today's Date: 02/19/2020    History of Present Illness 79 yo female found by son at home.  Reported have 20 minutes tonic/clonic seizure after [facility] arrived.  PMH includes CVA. Osteoarthritis, Meningioma, HLD, HTN, Insomnia, GERD, Esophageal dysmotility, Depression, Dementia, CAD, Colon polyps, B12 deficiency, Anxiety.  Intubated for airway protection. Pt also with signs of septic shock.   Clinical Impression   Pt with decline in function and safety with ADLs and ADL mobility with impaired strength, balance and endurance. Pt with Poor safety awareness and hx of dementia. Pt lives at home alone but with family very close by that is with her 24/7 and assist her as needed for home mgt and ADLs. Pt required min guard A for grooming and UB ADLs seated, max - total A for LB ADLs, mod A with transfers to [facility] and mod A with toileting. Pt would benefit form acute OT services to address impairments to maximize level of function and safeyy    Follow Up Recommendations  Home health OT;Supervision/Assistance - 24 hour    Equipment Recommendations  3 in 1 bedside commode    Recommendations for Other Services       Precautions / Restrictions Precautions Precautions: Fall Precaution Comments: seizure Restrictions Weight Bearing Restrictions: No      Mobility Bed Mobility Overal bed mobility: Needs Assistance Bed Mobility: Supine to Sit     Supine to sit: Mod assist     General bed mobility comments: pt with LEs towards EOB trying to get OOB unassisted upon arrival  Transfers Overall transfer level: Needs assistance Equipment used: 2 person hand held assist Transfers: Sit to/from Stand Sit to Stand: Mod assist;Min assist         General transfer comment: mod A +2 for initial stand from EOB, min A from BSC, posterior lean, poor safety awareness    Balance Overall balance  assessment: Needs assistance Sitting-balance support: Bilateral upper extremity supported;Feet unsupported Sitting balance-Leahy Scale: Fair   Postural control: Posterior lean Standing balance support: Bilateral upper extremity supported;During functional activity Standing balance-Leahy Scale: Poor                             ADL either performed or assessed with clinical judgement   ADL Overall ADL's : Needs assistance/impaired Eating/Feeding: Set up;Sitting   Grooming: Wash/dry hands;Wash/dry face;Min guard;Sitting   Upper Body Bathing: Min guard;Sitting   Lower Body Bathing: Maximal assistance   Upper Body Dressing : Min guard;Sitting   Lower Body Dressing: Total assistance                       Vision         Perception     Praxis      Pertinent Vitals/Pain Pain Assessment: Faces Faces Pain Scale: Hurts a little bit Pain Location: generalized Pain Descriptors / Indicators: Discomfort Pain Intervention(s): Monitored during session;Repositioned     Hand Dominance Right   Extremity/Trunk Assessment Upper Extremity Assessment Upper Extremity Assessment: Generalized weakness   Lower Extremity Assessment Lower Extremity Assessment: Defer to PT evaluation   Cervical / Trunk Assessment Cervical / Trunk Assessment: Kyphotic   Communication Communication Communication: No difficulties   Cognition Arousal/Alertness: Awake/alert Behavior During Therapy: Restless Overall Cognitive Status: History of cognitive impairments - at baseline  General Comments: pt follows commands, however is not oriented to time, demonstrates limited awareness of deficits. Pt staes that she is in the hospital but doesn't know why. Pt also states thats she drove to [place] on vacation from [place] was visiting friends. Pt's son arrived and was able to provide correct info   General Comments       Exercises      Shoulder Instructions      Home Living Family/patient expects to be discharged to:: Private residence Living Arrangements: Alone Available Help at Discharge: Family;Available PRN/intermittently Type of Home: House Home Access: Stairs to enter [employer] of Steps: 2 Entrance Stairs-Rails: Can reach both Home Layout: One level     Bathroom Shower/Tub: Occupational psychologist: Handicapped height     Home Equipment: Environmental consultant - 2 wheels   Additional Comments: need to confirm history with family 2/2 AMS      Prior Functioning/Environment Level of Independence: Independent        Comments: pt reports living independently, no use of DME. Pt with hx of dementia, need to confirm PLOF        OT Problem List: Decreased strength;Impaired balance (sitting and/or standing);Decreased cognition;Decreased knowledge of precautions;Pain;Decreased activity tolerance;Decreased coordination;Decreased knowledge of use of DME or AE;Decreased safety awareness      OT Treatment/Interventions: Self-care/ADL training;Therapeutic exercise;Neuromuscular education;Patient/family education;Therapeutic activities;DME and/or AE instruction    OT Goals(Current goals can be found in the care plan section) Acute Rehab OT Goals Patient Stated Goal: home OT Goal Formulation: With patient/family Time For Goal Achievement: 03/04/20 Potential to Achieve Goals: Good ADL Goals Pt Will Perform Grooming: with min assist;with min guard assist;standing;with caregiver independent in assisting Pt Will Perform Upper Body Bathing: with supervision;with set-up;sitting;with caregiver independent in assisting Pt Will Perform Lower Body Bathing: with mod assist;sitting/lateral leans;sit to/from stand;with caregiver independent in assisting Pt Will Perform Upper Body Dressing: with supervision;with set-up;sitting;with caregiver independent in assisting Pt Will Perform Lower Body Dressing: with max  assist;with mod assist;sitting/lateral leans;sit to/from stand;with caregiver independent in assisting Pt Will Transfer to Toilet: with min assist;stand pivot transfer;ambulating;bedside commode;grab bars Pt Will Perform Toileting - Clothing Manipulation and hygiene: with min assist;sit to/from stand  OT Frequency: Min 2X/week   Barriers to D/C:            Co-evaluation              AM-PAC OT "6 Clicks" Daily Activity     Outcome Measure Help from another person eating meals?: A Little Help from another person taking care of personal grooming?: A Little Help from another person toileting, which includes using toliet, bedpan, or urinal?: A Lot Help from another person bathing (including washing, rinsing, drying)?: A Lot Help from another person to put on and taking off regular upper body clothing?: A Little Help from another person to put on and taking off regular lower body clothing?: A Lot 6 Click Score: 15   End of Session Equipment Utilized During Treatment: Gait belt;Other (comment)(BSC) Nurse Communication: Mobility status  Activity Tolerance: Patient tolerated treatment well Patient left: in chair;with call bell/phone within reach;with chair alarm set;with family/visitor present  OT Visit Diagnosis: Unsteadiness on feet (R26.81);Other abnormalities of gait and mobility (R26.89);History of falling (Z91.81);Muscle weakness (generalized) (M62.81);Pain Pain - Right/Left: Left Pain - part of body: Shoulder                Time: [id number] OT Time Calculation (min): 25 min Charges:  OT General Charges $OT  Visit: 1 Visit OT Evaluation $OT Eval Moderate Complexity: 1 Mod    Britt Bottom 02/19/2020, 12:04 PM

## 2020-02-19 NOTE — Progress Notes (Signed)
  Speech Language Pathology Treatment: Dysphagia  Patient Details Name: Mallory Peters MRN: [id number] DOB: 09-13-1941 Today's Date: 02/19/2020 Time: [id number] SLP Time Calculation (min) (ACUTE ONLY): 20 min  Assessment / Plan / Recommendation Clinical Impression  Pt shows significant improvements in vocal quality today as well as her ability to attend to PO trials and self-feed. She still has intermittent throat clearing that is noted across all consistencies and even after PO intake has ceased. This could be related to baseline esophageal issues, as oropharyngeal swallow otherwise appears to be functional. There is no coughing today even when she takes larger boluses. When not pushed to challenge herself, she takes very small bites and sips at a time. Recommend initiating Dys 3 diet and thin liquids with use of aspiration and esophageal precautions.    HPI HPI: Pt is a 79 yo female presenting with seizures in the setting of meningioma, requiring ETT 4/27-4/29. PMH includes CVA, meningioma, HLD, HTN, GERD, esophageal dysmotility, dementia, CAD, B12 deficiency. Esophagram in 2017 showed intermittent laryngeal penetration that cleared with a reflexive cough; presbyesophagus; delayed relaxation of the LES; and small HH. Esophagram in 2016 showed a single episode of "massive aspiration" that also elicited coughing. BSE in 2018 with no overt s/s of aspiration and functional appearing swallow except for multiple swallows.      SLP Plan  Continue with current plan of care       Recommendations  Diet recommendations: Dysphagia 3 (mechanical soft);Thin liquid Liquids provided via: Cup;Straw Medication Administration: Crushed with puree Supervision: Patient able to self feed;Full supervision/cueing for compensatory strategies Compensations: Minimize environmental distractions;Slow rate;Small sips/bites;Follow solids with liquid Postural Changes and/or Swallow Maneuvers: Seated upright 90  degrees;Upright 30-60 min after meal                Oral Care Recommendations: Oral care BID Follow up Recommendations: 24 hour supervision/assistance SLP Visit Diagnosis: Dysphagia, pharyngoesophageal phase (R13.14) Plan: Continue with current plan of care       GO                 Osie Bond., M.A. [place] Acute Rehabilitation Services Pager [phone] Office [phone]  02/19/2020, 10:46 AM

## 2020-02-19 NOTE — Progress Notes (Signed)
Low bed and mats in place. Call bell within reach. Bed alarm on. Continuous rounds in pt by nurse and nurse tech.

## 2020-02-19 NOTE — Progress Notes (Signed)
Patient almost fell per patient's primary care nurse,she is oob frequently does not ask for assistance before getting up. Her nurse is in and out of the aprroximately every 5 minutes reminding her not to get up putting her back to bed she does not use call light or call for help before getting up her gait is unsteady. Writer beeped MD alert of above see we can get a sitter order as he haldol has minimal effect on patient she is wide awake wide open still trying get oob,and attempts unassisted ambulation. She does not remember instructions and does not redirect well at all. We have beeped MD for further orders.

## 2020-02-20 DIAGNOSIS — F320 Major depressive disorder, single episode, mild: Secondary | ICD-10-CM

## 2020-02-20 DIAGNOSIS — I5032 Chronic diastolic (congestive) heart failure: Secondary | ICD-10-CM | POA: Diagnosis present

## 2020-02-20 DIAGNOSIS — E785 Hyperlipidemia, unspecified: Secondary | ICD-10-CM | POA: Diagnosis present

## 2020-02-20 DIAGNOSIS — F419 Anxiety disorder, unspecified: Secondary | ICD-10-CM | POA: Diagnosis present

## 2020-02-20 DIAGNOSIS — D329 Benign neoplasm of meninges, unspecified: Secondary | ICD-10-CM | POA: Diagnosis present

## 2020-02-20 DIAGNOSIS — E7800 Pure hypercholesterolemia, unspecified: Secondary | ICD-10-CM

## 2020-02-20 DIAGNOSIS — F329 Major depressive disorder, single episode, unspecified: Secondary | ICD-10-CM | POA: Diagnosis present

## 2020-02-20 DIAGNOSIS — I248 Other forms of acute ischemic heart disease: Secondary | ICD-10-CM | POA: Diagnosis present

## 2020-02-20 DIAGNOSIS — F32A Depression, unspecified: Secondary | ICD-10-CM | POA: Diagnosis present

## 2020-02-20 DIAGNOSIS — R778 Other specified abnormalities of plasma proteins: Secondary | ICD-10-CM | POA: Diagnosis present

## 2020-02-20 LAB — COMPREHENSIVE METABOLIC PANEL
ALT: 26 U/L (ref 0–44)
AST: 35 U/L (ref 15–41)
Albumin: 2.8 g/dL — ABNORMAL LOW (ref 3.5–5.0)
Alkaline Phosphatase: 45 U/L (ref 38–126)
Anion gap: 9 (ref 5–15)
BUN: <5 mg/dL — ABNORMAL LOW (ref 8–23)
CO2: 27 mmol/L (ref 22–32)
Calcium: 8.4 mg/dL — ABNORMAL LOW (ref 8.9–10.3)
Chloride: 108 mmol/L (ref 98–111)
Creatinine, Ser: 0.62 mg/dL (ref 0.44–1.00)
GFR calc Af Amer: >60 mL/min (ref >60)
GFR calc non Af Amer: >60 mL/min (ref >60)
Glucose, Bld: 93 mg/dL (ref 70–99)
Potassium: 2.5 mmol/L — CL (ref 3.5–5.1)
Sodium: 144 mmol/L (ref 135–145)
Total Bilirubin: 1.2 mg/dL (ref 0.3–1.2)
Total Protein: 4.6 g/dL — ABNORMAL LOW (ref 6.5–8.1)

## 2020-02-20 LAB — CBC
HCT: 30.7 % — ABNORMAL LOW (ref 36.0–46.0)
Hemoglobin: 10.1 g/dL — ABNORMAL LOW (ref 12.0–15.0)
MCH: 27.8 pg (ref 26.0–34.0)
MCHC: 32.9 g/dL (ref 30.0–36.0)
MCV: 84.6 fL (ref 80.0–100.0)
Platelets: 113 10*3/uL — ABNORMAL LOW (ref 150–400)
RBC: 3.63 MIL/uL — ABNORMAL LOW (ref 3.87–5.11)
RDW: 13 % (ref 11.5–15.5)
WBC: 4.4 10*3/uL (ref 4.0–10.5)
nRBC: 0 % (ref 0.0–0.2)

## 2020-02-20 LAB — PHOSPHORUS
Phosphorus: 2.2 mg/dL — ABNORMAL LOW (ref 2.5–4.6)
Phosphorus: 3.5 mg/dL (ref 2.5–4.6)

## 2020-02-20 LAB — CSF CULTURE W GRAM STAIN
Culture: NO GROWTH
Gram Stain: NONE SEEN

## 2020-02-20 LAB — GLUCOSE, CAPILLARY
Glucose-Capillary: 84 mg/dL (ref 70–99)
Glucose-Capillary: 87 mg/dL (ref 70–99)
Glucose-Capillary: 81 mg/dL (ref 70–99)
Glucose-Capillary: 113 mg/dL — ABNORMAL HIGH (ref 70–99)
Glucose-Capillary: 114 mg/dL — ABNORMAL HIGH (ref 70–99)
Glucose-Capillary: 111 mg/dL — ABNORMAL HIGH (ref 70–99)

## 2020-02-20 LAB — MAGNESIUM: Magnesium: 1.7 mg/dL (ref 1.7–2.4)

## 2020-02-20 LAB — POTASSIUM: Potassium: 2.8 mmol/L — ABNORMAL LOW (ref 3.5–5.1)

## 2020-02-20 MED ORDER — POTASSIUM PHOSPHATES 15 MMOLE/5ML IV SOLN
30.0000 mmol | Freq: Once | INTRAVENOUS | Status: AC
  Administered 2020-02-20: 09:00:00 30 mmol via INTRAVENOUS
  Filled 2020-02-20: qty 10

## 2020-02-20 NOTE — Progress Notes (Signed)
CRITICAL VALUE ALERT  Critical Value: K+ 2.5  Date & Time Notied:  0718 am  Provider Notified: Dr.Woods  Orders Received/Actions taken: paged MD await orders.

## 2020-02-20 NOTE — Progress Notes (Addendum)
2155 Pt found on ground, witnessed fall by NT, pt fell on bottom, did not hit her head. Pt tangled in IV pole, attempting to use restroom. Staff assisted pt back to bed, VSS, see flow sheet. Bodenheimer, mid-level paged. Awaiting call back. Pt resting in bed, fall precautions in place, WCTM.   2215 RN called son, Brogan Thorburn about mother's fall. Son, very upset, stated that the patient had a sitter all day today. Mr. Lisa also upset that MD has not called and updated him with POC. RN will pass along to dayshift RN to have MD call family.   2330 Sitter arrived at bedside for safety. WCTM

## 2020-02-20 NOTE — Progress Notes (Signed)
PROGRESS NOTE    Mallory Peters  XHB:[id number] DOB: 1941/10/01 DOA: 02/16/2020 PCP: Idelle Crouch, MD     Brief Narrative:  79 yo WF PMHx CVA.Anxiety  Depression, Dementia,Osteoarthritis, Meningioma, HLD, HTN, Insomnia, GERD, Esophageal dysmotility, CAD, Colon polyps, B12 deficiency,   Found by son at home.  Reported have 20 minutes tonic/clonic seizure after [facility] arrived.  Hx of insomnia on chronic xanax.  Intubated for airway protection.  Transfer to [facility] for further neuro assessment.   Subjective: A/O x1 (does not know where, when, why).  Does know she is in the hospital.  Reading a book.  States has never had a seizure in the past.   Assessment & Plan:   Active Problems:   Seizures (HCC)   Chronic diastolic CHF (congestive heart failure) (HCC)   Meningioma (HCC)   Depression   Anxiety   Elevated troponin   Demand ischemia (HCC)   HLD (hyperlipidemia)   Seizure disorder/status epilepticus -Status epilepticus resolved; -Per neurology note 4/29 plan; -Keppra 1000 mg BID -Seizure precautions -EEG pending -Follow-up with Dr. Gurney Maxin 8 to 12 weeks post discharge -Repeat MRI brain 8 to 12 weeks post discharge  Left frontal meningioma -Because of status epilepticus? -Neurology consulted -See seizure disorder  Cerebral edema -HSV meningitis? -Very low likelihood. -Empiric Acyclovir continue   Hx of dementia. - hold outpt aricept, risperdal for now  Hx of depression, anxiety, insomnia. - hold outpt xanax, zoloft for now  Compromised airway in setting of seizure. -Resolved patient on room air  Fever. -Resolved.  No indication for empiric antibiotics, empiric acyclovir discontinued   Chronic diastolic CHF -Strict in and out -Daily weight  Hypokalemia -Potassium goal> 4 -K-Phos 30 mmol -Recheck K/Mg/Po4 @1500   Hypophosphatemia -Phosphorus goal> 2.5 -See hypokalemia  Hyperglycemia. - no hx of DM - SSI  Elevated troponin/demand  ischemia -Secondary to seizure  Hx of CAD, CVA, HLD. - f/u Echo - continue ASA, plavix, crestor    DVT prophylaxis: Lovenox Code Status: Full Family Communication:  Disposition Plan: PT/OT recommend 24-hour home supervision, home health 1.  Where the patient is from 2.  Anticipated d/c place. 3.  Barriers to d/c OR conditions which need to be met to effect a safe d/c. If patient is medically stable, please make sure this is clear: Patient is medically stable for discharge."    Consultants:  Neurology    Procedures/Significant Events:  4/27 [facility] from [facility] to [facility] ETT 4/27 >> 4/27 CT head  >> 1.2 cm Lt frontal convexity meningioma with possible trace vasogenic edema 4/27 LP  >> 0 WBC, 0 RBC, Glucose 80, Protein 71, gram stain negative 4/28 echocardiogram;Left Ventricle: Left ventricular ejection fraction, by estimation, is 60  to 65%. The left ventricle has normal function. The left ventricle has no  regional wall motion abnormalities. Definity contrast agent was given IV  to delineate the left ventricular  endocardial borders. The left ventricular internal cavity size was normal  in size. There is no left ventricular hypertrophy. Left ventricular  diastolic parameters are consistent with Grade I diastolic dysfunction  (impaired relaxation). Normal left  ventricular filling pressure.   Right Ventricle: The right ventricular size is normal. No increase in  right ventricular wall thickness. Right ventricular systolic function is  normal. There is mildly elevated pulmonary artery systolic pressure. The  tricuspid regurgitant velocity is 2.38  m/s, and with an assumed right atrial pressure of 15 mmHg, the estimated  right ventricular systolic pressure is 81.0 mmHg.  I have personally reviewed and interpreted all radiology studies and my findings are as above.  VENTILATOR SETTINGS: Room air 5/1 SPO2; 94%   Cultures SARS CoV2 PCR 4/27 >>  negative Influenza PCR 4/27 >> negative Blood 4/27 >> Urine 4/27 >> CSF 4/27 >> 4/27 CSF HSV 1/2 negative    Antimicrobials: Anti-infectives (From admission, onward)   Start     Stop   02/17/20 1900  acyclovir (ZOVIRAX) 500 mg in dextrose 5 % 100 mL IVPB  Status:  Discontinued     02/19/20 0841   02/17/20 1430  cefTRIAXone (ROCEPHIN) 2 g in sodium chloride 0.9 % 100 mL IVPB  Status:  Discontinued     02/19/20 0841       Devices    LINES / TUBES:      Continuous Infusions: . sodium chloride Stopped (02/17/20 2357)  . lactated ringers 50 mL/hr at 02/20/20 1750  . levETIRAcetam 1,000 mg (02/20/20 0947)     Objective: Vitals:   02/20/20 0500 02/20/20 0700 02/20/20 1106 02/20/20 1623  BP: (!) 112/58  125/84   Pulse: 79  84   Resp: 18  17 18   Temp: 98.9 F (37.2 C) 99.3 F (37.4 C) 98.6 F (37 C) 98.7 F (37.1 C)  TempSrc: Oral Oral Oral Oral  SpO2: 96%  93%   Weight:      Height:        Intake/Output Summary (Last 24 hours) at 02/20/2020 1840 Last data filed at 02/20/2020 1811 Gross per 24 hour  Intake 3302.41 ml  Output --  Net 3302.41 ml   Filed Weights   02/17/20 0306 02/18/20 0500 02/19/20 0400  Weight: 58.8 kg 57.1 kg 60.1 kg    Examination:  General: A/O x1 (does not know where, when, why).  Does know she is in the hospital.  No acute respiratory distress Eyes: negative scleral hemorrhage, negative anisocoria, negative icterus ENT: Negative Runny nose, negative gingival bleeding, Neck:  Negative scars, masses, torticollis, lymphadenopathy, JVD Lungs: Clear to auscultation bilaterally without wheezes or crackles Cardiovascular: Regular rate and rhythm without murmur gallop or rub normal S1 and S2 Abdomen: negative abdominal pain, nondistended, positive soft, bowel sounds, no rebound, no ascites, no appreciable mass Extremities: No significant cyanosis, clubbing, or edema bilateral lower extremities Skin: Negative rashes, lesions,  ulcers Psychiatric:  Negative depression, negative anxiety, negative fatigue, negative mania  Central nervous system:  Cranial nerves II through XII intact, tongue/uvula midline, all extremities muscle strength 4/5, sensation intact throughout, negative dysarthria, negative expressive aphasia, negative receptive aphasia.  .     Data Reviewed: Care during the described time interval was provided by me .  I have reviewed this patient's available data, including medical history, events of note, physical examination, and all test results as part of my evaluation.  CBC: Recent Labs  Lab 02/16/20 1412 02/17/20 0426 02/17/20 0448 02/18/20 0314 02/20/20 0500  WBC 15.1*  --  13.9* 15.1* 4.4  NEUTROABS 10.7*  --   --   --   --   HGB 14.1 12.2 12.6 12.3 10.1*  HCT 44.3 36.0 39.9 38.2 30.7*  MCV 87.4  --  89.1 87.4 84.6  PLT 296  --  205 229 672*   Basic Metabolic Panel: Recent Labs  Lab 02/16/20 1412 02/17/20 0426 02/17/20 0448 02/18/20 0314 02/19/20 0449 02/20/20 0500  NA 136 138 137 143  --  144  K 3.2* 3.6 3.6 3.6  --  2.5*  CL 101  --  110  114*  --  108  CO2 13*  --  13* 21*  --  27  GLUCOSE 181*  --  120* 108*  --  93  BUN 12  --  <5* <5*  --  <5*  CREATININE 0.99  --  0.72 0.60  --  0.62  CALCIUM 9.2  --  8.3* 8.5*  --  8.4*  MG  --   --  1.8 1.8  --   --   PHOS  --   --  2.8 2.2* 1.2* 2.2*   GFR: Estimated Creatinine Clearance: 45.1 mL/min (by C-G formula based on SCr of 0.62 mg/dL). Liver Function Tests: Recent Labs  Lab 02/16/20 1412 02/20/20 0500  AST 27 35  ALT 15 26  ALKPHOS 76 45  BILITOT 0.9 1.2  PROT 7.6 4.6*  ALBUMIN 4.6 2.8*   No results for input(s): LIPASE, AMYLASE in the last 168 hours. No results for input(s): AMMONIA in the last 168 hours. Coagulation Profile: Recent Labs  Lab 02/16/20 1412  INR 1.1   Cardiac Enzymes: Recent Labs  Lab 02/16/20 1409  CKTOTAL 40   BNP (last 3 results) No results for input(s): PROBNP in the last 8760  hours. HbA1C: No results for input(s): HGBA1C in the last 72 hours. CBG: Recent Labs  Lab 02/20/20 0125 02/20/20 0542 02/20/20 0737 02/20/20 1102 02/20/20 1622  GLUCAP 84 87 81 113* 114*   Lipid Profile: No results for input(s): CHOL, HDL, LDLCALC, TRIG, CHOLHDL, LDLDIRECT in the last 72 hours. Thyroid Function Tests: Recent Labs    02/18/20 1156  TSH 1.731   Anemia Panel: Recent Labs    02/18/20 1156  VITAMINB12 1,031*   Sepsis Labs: Recent Labs  Lab 02/16/20 1401 02/16/20 1409 02/16/20 1615 02/17/20 1033 02/18/20 0314 02/18/20 1550 02/19/20 0449  PROCALCITON  --  <0.10  --  0.19 0.15  --  <0.10  LATICACIDVEN >11.0*  --  3.6* 2.0*  --  1.5  --     Recent Results (from the past 240 hour(s))  Culture, blood (Routine x 2)     Status: Abnormal   Collection Time: 02/16/20  2:12 PM   Specimen: BLOOD  Result Value Ref Range Status   Specimen Description   Final    BLOOD BLOOD RIGHT FOREARM Performed at [facility], [address], [place], [place] [address]    Special Requests   Final    BOTTLES DRAWN AEROBIC AND ANAEROBIC Blood Culture adequate volume Performed at [facility], [address] [place], [place] [address]    Culture  Setup Time   Final    GRAM POSITIVE COCCI ANAEROBIC BOTTLE ONLY CRITICAL RESULT CALLED TO, READ BACK BY AND VERIFIED WITH: GRACE BARR AT 0800 ON 02/17/20 SNG    Culture (A)  Final    STAPHYLOCOCCUS SPECIES (COAGULASE NEGATIVE) THE SIGNIFICANCE OF ISOLATING THIS ORGANISM FROM A SINGLE SET OF BLOOD CULTURES WHEN MULTIPLE SETS ARE DRAWN IS UNCERTAIN. PLEASE NOTIFY THE MICROBIOLOGY DEPARTMENT WITHIN ONE WEEK IF SPECIATION AND SENSITIVITIES ARE REQUIRED. Performed at [facility], [address], [place], [place] [address]    Report Status 02/19/2020 FINAL  Final  Urine culture     Status: None   Collection Time: 02/16/20  2:12 PM   Specimen: In/Out Cath Urine  Result Value Ref Range Status   Specimen  Description   Final    IN/OUT CATH URINE Performed at [facility], [address] [place],  [address]  Special Requests   Final    NONE Performed at [facility], [address] [place], [place] [address]    Culture   Final    NO GROWTH Performed at [facility], [place] [address], [place], [place] [address]    Report Status 02/17/2020 FINAL  Final  Blood Culture ID Panel (Reflexed)     Status: Abnormal   Collection Time: 02/16/20  2:12 PM  Result Value Ref Range Status   Enterococcus species NOT DETECTED NOT DETECTED Final   Listeria monocytogenes NOT DETECTED NOT DETECTED Final   Staphylococcus species DETECTED (A) NOT DETECTED Final    Comment: Methicillin (oxacillin) resistant coagulase negative staphylococcus. Possible blood culture contaminant (unless isolated from more than one blood culture draw or clinical case suggests pathogenicity). No antibiotic treatment is indicated for blood  culture contaminants. CRITICAL RESULT CALLED TO, READ BACK BY AND VERIFIED WITH: GRACE BARR AT 0800 ON 02/17/20 SNG    Staphylococcus aureus (BCID) NOT DETECTED NOT DETECTED Final   Methicillin resistance DETECTED (A) NOT DETECTED Final    Comment: CRITICAL RESULT CALLED TO, READ BACK BY AND VERIFIED WITH: GRACE BARR AT 0800 ON 02/17/20 SNG    Streptococcus species NOT DETECTED NOT DETECTED Final   Streptococcus agalactiae NOT DETECTED NOT DETECTED Final   Streptococcus pneumoniae NOT DETECTED NOT DETECTED Final   Streptococcus pyogenes NOT DETECTED NOT DETECTED Final   Acinetobacter baumannii NOT DETECTED NOT DETECTED Final   Enterobacteriaceae species NOT DETECTED NOT DETECTED Final   Enterobacter cloacae complex NOT DETECTED NOT DETECTED Final   Escherichia coli NOT DETECTED NOT DETECTED Final   Klebsiella oxytoca NOT DETECTED NOT DETECTED Final   Klebsiella pneumoniae NOT DETECTED NOT DETECTED Final   Proteus species NOT DETECTED NOT DETECTED Final    Serratia marcescens NOT DETECTED NOT DETECTED Final   Haemophilus influenzae NOT DETECTED NOT DETECTED Final   Neisseria meningitidis NOT DETECTED NOT DETECTED Final   Pseudomonas aeruginosa NOT DETECTED NOT DETECTED Final   Candida albicans NOT DETECTED NOT DETECTED Final   Candida glabrata NOT DETECTED NOT DETECTED Final   Candida krusei NOT DETECTED NOT DETECTED Final   Candida parapsilosis NOT DETECTED NOT DETECTED Final   Candida tropicalis NOT DETECTED NOT DETECTED Final    Comment: Performed at [facility], [address] [place], [place] [address]  Respiratory Panel by RT PCR (Flu A&B, Covid) - Nasopharyngeal Swab     Status: None   Collection Time: 02/16/20  2:13 PM   Specimen: Nasopharyngeal Swab  Result Value Ref Range Status   SARS Coronavirus 2 by RT PCR NEGATIVE NEGATIVE Final    Comment: (NOTE) SARS-CoV-2 target nucleic acids are NOT DETECTED. The SARS-CoV-2 RNA is generally detectable in upper respiratoy specimens during the acute phase of infection. The lowest concentration of SARS-CoV-2 viral copies this assay can detect is 131 copies/mL. A negative result does not preclude SARS-Cov-2 infection and should not be used as the sole basis for treatment or other patient management decisions. A negative result may occur with  improper specimen collection/handling, submission of specimen other than nasopharyngeal swab, presence of viral mutation(s) within the areas targeted by this assay, and inadequate number of viral copies (<131 copies/mL). A negative result must be combined with clinical observations, patient history, and epidemiological information. The expected result is Negative. Fact Sheet for Patients:  PinkCheek.be Fact Sheet for Healthcare Providers:  GravelBags.it This test is not yet ap proved or cleared by the [place] and  has been authorized for detection and/or diagnosis of  SARS-CoV-2 by FDA under an Emergency Use Authorization (EUA). This EUA will remain  in effect (meaning this test can be used) for the duration of the COVID-19 declaration under Section 564(b)(1) of the Act, 21 U.S.C. section 360bbb-3(b)(1), unless the authorization is terminated or revoked sooner.    Influenza A by PCR NEGATIVE NEGATIVE Final   Influenza B by PCR NEGATIVE NEGATIVE Final    Comment: (NOTE) The Xpert Xpress SARS-CoV-2/FLU/RSV assay is intended as an aid in  the diagnosis of influenza from Nasopharyngeal swab specimens and  should not be used as a sole basis for treatment. Nasal washings and  aspirates are unacceptable for Xpert Xpress SARS-CoV-2/FLU/RSV  testing. Fact Sheet for Patients: PinkCheek.be Fact Sheet for Healthcare Providers: GravelBags.it This test is not yet approved or cleared by the [place] FDA and  has been authorized for detection and/or diagnosis of SARS-CoV-2 by  FDA under an Emergency Use Authorization (EUA). This EUA will remain  in effect (meaning this test can be used) for the duration of the  Covid-19 declaration under Section 564(b)(1) of the Act, 21  U.S.C. section 360bbb-3(b)(1), unless the authorization is  terminated or revoked. Performed at [facility], [address] [place], [place] [address]   Culture, blood (Routine x 2)     Status: None (Preliminary result)   Collection Time: 02/16/20  2:27 PM   Specimen: BLOOD  Result Value Ref Range Status   Specimen Description BLOOD RIGHT ANTECUBITAL  Final   Special Requests   Final    BOTTLES DRAWN AEROBIC AND ANAEROBIC Blood Culture adequate volume   Culture   Final    NO GROWTH 4 DAYS Performed at [facility], [address], [place], [place] [address]    Report Status PENDING  Incomplete  CSF culture     Status: None   Collection Time: 02/16/20  3:28 PM   Specimen: CSF; Cerebrospinal Fluid  Result  Value Ref Range Status   Specimen Description   Final    CSF Performed at [facility], [address] [place], [place] [address]    Special Requests   Final    NONE Performed at [facility] [address] [place], [place] [address]    Gram Stain   Final    NO ORGANISMS SEEN NO RBCS OR WBCS SEEN Performed at [facility], [address], [place], [place] [address]    Culture   Final    NO GROWTH 3 DAYS Performed at [facility], [place] [address] [place], [place] [address]    Report Status 02/20/2020 FINAL  Final  MRSA PCR Screening     Status: None   Collection Time: 02/16/20  7:13 PM   Specimen: Nasal Mucosa; Nasopharyngeal  Result Value Ref Range Status   MRSA by PCR NEGATIVE NEGATIVE Final    Comment:        The GeneXpert MRSA Assay (FDA approved for NASAL specimens only), is one component of a comprehensive MRSA colonization surveillance program. It is not intended to diagnose MRSA infection nor to guide or monitor treatment for MRSA infections. Performed at [facility], [place] [address], [place], [place] [address]   Culture, blood (routine x 2)     Status: None (Preliminary result)   Collection Time: 02/17/20 10:33 AM   Specimen: BLOOD LEFT HAND  Result Value Ref Range Status   Specimen Description BLOOD LEFT HAND  Final   Special Requests   Final    BOTTLES DRAWN AEROBIC ONLY Blood Culture adequate volume   Culture   Final    NO GROWTH 3 DAYS Performed at [facility], [address], [place], [place] [address]    Report Status PENDING  Incomplete  Culture, blood (routine x 2)     Status: None (Preliminary result)   Collection Time: 02/17/20 10:33 AM   Specimen: BLOOD LEFT HAND  Result Value Ref Range Status   Specimen Description BLOOD LEFT HAND  Final   Special Requests   Final    BOTTLES DRAWN AEROBIC ONLY Blood Culture adequate volume   Culture   Final    NO GROWTH 3 DAYS Performed at [facility], [place] [address] [place], [place] [address]    Report Status PENDING  Incomplete  Culture, respiratory (non-expectorated)     Status: None   Collection Time: 02/17/20 11:43 AM   Specimen: Tracheal Aspirate; Respiratory  Result Value Ref Range Status   Specimen Description TRACHEAL ASPIRATE  Final   Special Requests NONE  Final   Gram Stain   Final    MODERATE WBC PRESENT,BOTH PMN AND MONONUCLEAR NO ORGANISMS SEEN    Culture   Final    Consistent with normal respiratory flora. Performed at [facility], [place] [address] [place], [place] [address]    Report Status 02/19/2020 FINAL  Final         Radiology Studies: No results found.      Scheduled Meds: . aspirin  81 mg Oral Daily  . chlorhexidine  15 mL Mouth Rinse BID  . Chlorhexidine Gluconate Cloth  6 each Topical Daily  . clopidogrel  75 mg Oral Daily  . docusate sodium  100 mg Oral Daily  . enoxaparin (LOVENOX) injection  40 mg Subcutaneous Q24H  . feeding supplement (GLUCERNA SHAKE)  237 mL Oral BID BM  . insulin aspart  0-15 Units Subcutaneous Q4H  . mouth rinse  15 mL Mouth Rinse q12n4p  . multivitamin with minerals  1 tablet Oral Daily  . pantoprazole  40 mg Oral Daily  . polyethylene glycol  17 g Per Tube Daily  . rosuvastatin  10 mg Oral Daily  . sodium chloride flush  10-40 mL Intracatheter Q12H   Continuous Infusions: . sodium chloride Stopped (02/17/20 2357)  . lactated ringers 50 mL/hr at 02/20/20 1750  . levETIRAcetam 1,000 mg (02/20/20 0947)     LOS: 4 days    Time spent:40 min    Vannary Greening, Geraldo Docker, MD [facility] Pager [phone]  If 7PM-7AM, please contact night-coverage [email or web address] Password [facility] 02/20/2020, 6:40 PM

## 2020-02-20 NOTE — Progress Notes (Signed)
Post fall vital signs

## 2020-02-21 LAB — CBC WITH DIFFERENTIAL/PLATELET
Abs Immature Granulocytes: 0.02 10*3/uL (ref 0.00–0.07)
Basophils Absolute: 0 10*3/uL (ref 0.0–0.1)
Basophils Relative: 1 %
Eosinophils Absolute: 0.1 10*3/uL (ref 0.0–0.5)
Eosinophils Relative: 2 %
HCT: 31.3 % — ABNORMAL LOW (ref 36.0–46.0)
Hemoglobin: 10.4 g/dL — ABNORMAL LOW (ref 12.0–15.0)
Immature Granulocytes: 0 %
Lymphocytes Relative: 13 %
Lymphs Abs: 0.8 10*3/uL (ref 0.7–4.0)
MCH: 28.1 pg (ref 26.0–34.0)
MCHC: 33.2 g/dL (ref 30.0–36.0)
MCV: 84.6 fL (ref 80.0–100.0)
Monocytes Absolute: 0.6 10*3/uL (ref 0.1–1.0)
Monocytes Relative: 10 %
Neutro Abs: 4.2 10*3/uL (ref 1.7–7.7)
Neutrophils Relative %: 74 %
Platelets: 152 10*3/uL (ref 150–400)
RBC: 3.7 MIL/uL — ABNORMAL LOW (ref 3.87–5.11)
RDW: 13.2 % (ref 11.5–15.5)
WBC: 5.6 10*3/uL (ref 4.0–10.5)
nRBC: 0 % (ref 0.0–0.2)

## 2020-02-21 LAB — GLUCOSE, CAPILLARY
Glucose-Capillary: 109 mg/dL — ABNORMAL HIGH (ref 70–99)
Glucose-Capillary: 135 mg/dL — ABNORMAL HIGH (ref 70–99)
Glucose-Capillary: 87 mg/dL (ref 70–99)
Glucose-Capillary: 92 mg/dL (ref 70–99)
Glucose-Capillary: 93 mg/dL (ref 70–99)
Glucose-Capillary: 95 mg/dL (ref 70–99)
Glucose-Capillary: 97 mg/dL (ref 70–99)

## 2020-02-21 LAB — COMPREHENSIVE METABOLIC PANEL
ALT: 25 U/L (ref 0–44)
AST: 32 U/L (ref 15–41)
Albumin: 2.9 g/dL — ABNORMAL LOW (ref 3.5–5.0)
Alkaline Phosphatase: 47 U/L (ref 38–126)
Anion gap: 8 (ref 5–15)
BUN: <5 mg/dL — ABNORMAL LOW (ref 8–23)
CO2: 28 mmol/L (ref 22–32)
Calcium: 8.6 mg/dL — ABNORMAL LOW (ref 8.9–10.3)
Chloride: 108 mmol/L (ref 98–111)
Creatinine, Ser: 0.57 mg/dL (ref 0.44–1.00)
GFR calc Af Amer: >60 mL/min (ref >60)
GFR calc non Af Amer: >60 mL/min (ref >60)
Glucose, Bld: 101 mg/dL — ABNORMAL HIGH (ref 70–99)
Potassium: 2.5 mmol/L — CL (ref 3.5–5.1)
Sodium: 144 mmol/L (ref 135–145)
Total Bilirubin: 1.3 mg/dL — ABNORMAL HIGH (ref 0.3–1.2)
Total Protein: 5.3 g/dL — ABNORMAL LOW (ref 6.5–8.1)

## 2020-02-21 LAB — CULTURE, BLOOD (ROUTINE X 2)
Culture: NO GROWTH
Special Requests: ADEQUATE

## 2020-02-21 LAB — PHOSPHORUS: Phosphorus: 3 mg/dL (ref 2.5–4.6)

## 2020-02-21 LAB — MAGNESIUM: Magnesium: 1.9 mg/dL (ref 1.7–2.4)

## 2020-02-21 MED ORDER — POTASSIUM CHLORIDE CRYS ER 20 MEQ PO TBCR
50.0000 meq | EXTENDED_RELEASE_TABLET | Freq: Two times a day (BID) | ORAL | Status: AC
  Administered 2020-02-21 (×2): 50 meq via ORAL
  Filled 2020-02-21 (×2): qty 1

## 2020-02-21 MED ORDER — MAGNESIUM SULFATE 2 GM/50ML IV SOLN
2.0000 g | Freq: Once | INTRAVENOUS | Status: AC
  Administered 2020-02-21: 12:00:00 2 g via INTRAVENOUS
  Filled 2020-02-21: qty 50

## 2020-02-21 MED ORDER — POLYETHYLENE GLYCOL 3350 17 G PO PACK
17.0000 g | PACK | Freq: Every day | ORAL | Status: DC
  Administered 2020-02-21 – 2020-02-22 (×2): 17 g via ORAL
  Filled 2020-02-21 (×2): qty 1

## 2020-02-21 NOTE — Progress Notes (Signed)
CRITICAL VALUE ALERT  Critical Value:  K 2.5  Date & Time Notied: 02/21/20 0645  Provider Notified: Silas [place]  Orders Received/Actions taken: Awaiting orders/call back

## 2020-02-21 NOTE — Progress Notes (Signed)
PROGRESS NOTE    Mallory Peters  CBS:[id number] DOB: July 06, 1941 DOA: 02/16/2020 PCP: Idelle Crouch, MD     Brief Narrative:  79 yo WF PMHx CVA.Anxiety  Depression, Dementia,Osteoarthritis, Meningioma, HLD, HTN, Insomnia, GERD, Esophageal dysmotility, CAD, Colon polyps, B12 deficiency,   Found by son at home.  Reported have 20 minutes tonic/clonic seizure after [facility] arrived.  Hx of insomnia on chronic xanax.  Intubated for airway protection.  Transfer to [facility] for further neuro assessment.   Subjective: 5/2 A/O x1 (does not know where, when, why).  Sitting comfortably in bed reading a book.  States she is given her car away to her son and understands she cannot drive secondary to seizures.   Assessment & Plan:   Active Problems:   Seizures (HCC)   Chronic diastolic CHF (congestive heart failure) (HCC)   Meningioma (HCC)   Depression   Anxiety   Elevated troponin   Demand ischemia (HCC)   HLD (hyperlipidemia)   Seizure disorder/status epilepticus -Status epilepticus resolved; -Per neurology note 4/29 plan; -Keppra 1000 mg BID -Seizure precautions -EEG pending -Per [facility] statutes, patients with seizures are not allowed to drive until they have been seizure-free for six months and cleared by a neurologist -Schedule follow-up appointment with Dr. Gurney Maxin in 8 weeks post discharge -Repeat MRI brain 8 to 12 weeks post discharge  Left frontal meningioma -Because of status epilepticus? -Neurology consulted -See seizure disorder  Cerebral edema -HSV meningitis? -Very low likelihood. -Empiric Acyclovir continue   Hx of dementia. - hold outpt aricept, risperdal for now  Hx of depression, anxiety, insomnia. - hold outpt xanax, zoloft for now  Compromised airway in setting of seizure. -Resolved patient on room air  Fever. -Resolved.  No indication for empiric antibiotics, empiric acyclovir discontinued   Chronic diastolic CHF -Strict in  and out -Daily weight  Hypokalemia -Potassium goal> 4 -K-Dur 50 mEq x 2 dose  Hypomagnesmia -Magnesium goal> 2 -Magnesium IV 2 g  Hypophosphatemia -Phosphorus goal> 2.5  Hyperglycemia. - no hx of DM - SSI  Elevated troponin/demand ischemia -Secondary to seizure  Hx of CAD, CVA, HLD. - f/u Echo - continue ASA, plavix, crestor     DVT prophylaxis: Lovenox Code Status: Full Family Communication: 5/2 spoke with Pieter Partridge (son) counseled on plan of care answered all questions.  States either he or his sister will be with their mother 24 hours/day.   Disposition Plan: PT/OT recommend 24-hour home supervision, home health 1.  Where the patient is from 2.  Anticipated d/c place. 3.  Barriers to d/c OR conditions which need to be met to effect a safe d/c. If patient is medically stable, please make sure this is clear: Patient is medically stable for discharge."    Consultants:  Neurology    Procedures/Significant Events:  4/27 transfer from [facility] to [facility] ETT 4/27 >> 4/27 CT head  >> 1.2 cm Lt frontal convexity meningioma with possible trace vasogenic edema 4/27 LP  >> 0 WBC, 0 RBC, Glucose 80, Protein 71, gram stain negative 4/28 echocardiogram;Left Ventricle: Left ventricular ejection fraction, by estimation, is 60  to 65%. The left ventricle has normal function. The left ventricle has no  regional wall motion abnormalities. Definity contrast agent was given IV  to delineate the left ventricular  endocardial borders. The left ventricular internal cavity size was normal  in size. There is no left ventricular hypertrophy. Left ventricular  diastolic parameters are consistent with Grade I diastolic dysfunction  (impaired relaxation).  Normal left  ventricular filling pressure.   Right Ventricle: The right ventricular size is normal. No increase in  right ventricular wall thickness. Right ventricular systolic function is  normal. There is mildly elevated pulmonary artery  systolic pressure. The  tricuspid regurgitant velocity is 2.38  m/s, and with an assumed right atrial pressure of 15 mmHg, the estimated  right ventricular systolic pressure is 44.0 mmHg.       I have personally reviewed and interpreted all radiology studies and my findings are as above.  VENTILATOR SETTINGS: Room air 5/1 SPO2; 94%   Cultures SARS CoV2 PCR 4/27 >> negative Influenza PCR 4/27 >> negative Blood 4/27 >> Urine 4/27 >> CSF 4/27 >> 4/27 CSF HSV 1/2 negative    Antimicrobials: Anti-infectives (From admission, onward)   Start     Stop   02/17/20 1900  acyclovir (ZOVIRAX) 500 mg in dextrose 5 % 100 mL IVPB  Status:  Discontinued     02/19/20 0841   02/17/20 1430  cefTRIAXone (ROCEPHIN) 2 g in sodium chloride 0.9 % 100 mL IVPB  Status:  Discontinued     02/19/20 0841       Devices    LINES / TUBES:      Continuous Infusions: . sodium chloride Stopped (02/17/20 2357)  . lactated ringers 50 mL/hr at 02/20/20 1750  . levETIRAcetam 1,000 mg (02/20/20 2223)     Objective: Vitals:   02/20/20 2156 02/21/20 0000 02/21/20 0400 02/21/20 0500  BP:  126/66 127/78   Pulse: (!) 103 90 88   Resp:  15 18   Temp:  98.4 F (36.9 C) 99.5 F (37.5 C)   TempSrc:  Oral Oral   SpO2: 99% 98% 98%   Weight:    56.2 kg  Height:    5' 2"  (1.575 m)    Intake/Output Summary (Last 24 hours) at 02/21/2020 0742 Last data filed at 02/21/2020 0430 Gross per 24 hour  Intake 2473.12 ml  Output 1500 ml  Net 973.12 ml   Filed Weights   02/18/20 0500 02/19/20 0400 02/21/20 0500  Weight: 57.1 kg 60.1 kg 56.2 kg    Examination:  General: A/O x1 (does not know where, when, why).  Does know she is in the hospital.  No acute respiratory distress Eyes: negative scleral hemorrhage, negative anisocoria, negative icterus ENT: Negative Runny nose, negative gingival bleeding, Neck:  Negative scars, masses, torticollis, lymphadenopathy, JVD Lungs: Clear to auscultation bilaterally  without wheezes or crackles Cardiovascular: Regular rate and rhythm without murmur gallop or rub normal S1 and S2 Abdomen: negative abdominal pain, nondistended, positive soft, bowel sounds, no rebound, no ascites, no appreciable mass Extremities: No significant cyanosis, clubbing, or edema bilateral lower extremities Skin: Negative rashes, lesions, ulcers Psychiatric:  Negative depression, negative anxiety, negative fatigue, negative mania  Central nervous system:  Cranial nerves II through XII intact, tongue/uvula midline, all extremities muscle strength 4/5, sensation intact throughout, negative dysarthria, negative expressive aphasia, negative receptive aphasia.  .     Data Reviewed: Care during the described time interval was provided by me .  I have reviewed this patient's available data, including medical history, events of note, physical examination, and all test results as part of my evaluation.  CBC: Recent Labs  Lab 02/16/20 1412 02/16/20 1412 02/17/20 0426 02/17/20 0448 02/18/20 0314 02/20/20 0500 02/21/20 0500  WBC 15.1*  --   --  13.9* 15.1* 4.4 5.6  NEUTROABS 10.7*  --   --   --   --   --  4.2  HGB 14.1   < > 12.2 12.6 12.3 10.1* 10.4*  HCT 44.3   < > 36.0 39.9 38.2 30.7* 31.3*  MCV 87.4  --   --  89.1 87.4 84.6 84.6  PLT 296  --   --  205 229 113* 152   < > = values in this interval not displayed.   Basic Metabolic Panel: Recent Labs  Lab 02/16/20 1412 02/16/20 1412 02/17/20 0426 02/17/20 0426 02/17/20 0448 02/17/20 0448 02/18/20 0314 02/19/20 0449 02/20/20 0500 02/20/20 1810 02/21/20 0500  NA 136   < > 138  --  137  --  143  --  144  --  144  K 3.2*   < > 3.6   < > 3.6  --  3.6  --  2.5* 2.8* 2.5*  CL 101  --   --   --  110  --  114*  --  108  --  108  CO2 13*  --   --   --  13*  --  21*  --  27  --  28  GLUCOSE 181*  --   --   --  120*  --  108*  --  93  --  101*  BUN 12  --   --   --  <5*  --  <5*  --  <5*  --  <5*  CREATININE 0.99  --   --   --   0.72  --  0.60  --  0.62  --  0.57  CALCIUM 9.2  --   --   --  8.3*  --  8.5*  --  8.4*  --  8.6*  MG  --   --   --   --  1.8  --  1.8  --   --  1.7 1.9  PHOS  --   --   --   --  2.8   < > 2.2* 1.2* 2.2* 3.5 3.0   < > = values in this interval not displayed.   GFR: Estimated Creatinine Clearance: 45.1 mL/min (by C-G formula based on SCr of 0.57 mg/dL). Liver Function Tests: Recent Labs  Lab 02/16/20 1412 02/20/20 0500 02/21/20 0500  AST 27 35 32  ALT 15 26 25   ALKPHOS 76 45 47  BILITOT 0.9 1.2 1.3*  PROT 7.6 4.6* 5.3*  ALBUMIN 4.6 2.8* 2.9*   No results for input(s): LIPASE, AMYLASE in the last 168 hours. No results for input(s): AMMONIA in the last 168 hours. Coagulation Profile: Recent Labs  Lab 02/16/20 1412  INR 1.1   Cardiac Enzymes: Recent Labs  Lab 02/16/20 1409  CKTOTAL 40   BNP (last 3 results) No results for input(s): PROBNP in the last 8760 hours. HbA1C: No results for input(s): HGBA1C in the last 72 hours. CBG: Recent Labs  Lab 02/20/20 1102 02/20/20 1622 02/20/20 2001 02/21/20 0019 02/21/20 0419  GLUCAP 113* 114* 111* 95 97   Lipid Profile: No results for input(s): CHOL, HDL, LDLCALC, TRIG, CHOLHDL, LDLDIRECT in the last 72 hours. Thyroid Function Tests: Recent Labs    02/18/20 1156  TSH 1.731   Anemia Panel: Recent Labs    02/18/20 1156  VITAMINB12 1,031*   Sepsis Labs: Recent Labs  Lab 02/16/20 1401 02/16/20 1409 02/16/20 1615 02/17/20 1033 02/18/20 0314 02/18/20 1550 02/19/20 0449  PROCALCITON  --  <0.10  --  0.19 0.15  --  <0.10  LATICACIDVEN >11.0*  --  3.6* 2.0*  --  1.5  --     Recent Results (from the past 240 hour(s))  Culture, blood (Routine x 2)     Status: Abnormal   Collection Time: 02/16/20  2:12 PM   Specimen: BLOOD  Result Value Ref Range Status   Specimen Description   Final    BLOOD BLOOD RIGHT FOREARM Performed at [facility], [address], [place], [place] [address]    Special Requests    Final    BOTTLES DRAWN AEROBIC AND ANAEROBIC Blood Culture adequate volume Performed at [facility], [address] [place], [place] [address]    Culture  Setup Time   Final    GRAM POSITIVE COCCI ANAEROBIC BOTTLE ONLY CRITICAL RESULT CALLED TO, READ BACK BY AND VERIFIED WITH: GRACE BARR AT 0800 ON 02/17/20 SNG    Culture (A)  Final    STAPHYLOCOCCUS SPECIES (COAGULASE NEGATIVE) THE SIGNIFICANCE OF ISOLATING THIS ORGANISM FROM A SINGLE SET OF BLOOD CULTURES WHEN MULTIPLE SETS ARE DRAWN IS UNCERTAIN. PLEASE NOTIFY THE MICROBIOLOGY DEPARTMENT WITHIN ONE WEEK IF SPECIATION AND SENSITIVITIES ARE REQUIRED. Performed at [facility], [place] [address] [place], [place] [address]    Report Status 02/19/2020 FINAL  Final  Urine culture     Status: None   Collection Time: 02/16/20  2:12 PM   Specimen: In/Out Cath Urine  Result Value Ref Range Status   Specimen Description   Final    IN/OUT CATH URINE Performed at [facility], [address] [place], [place] [address]    Special Requests   Final    NONE Performed at [facility], [address] [place], [place] [address]    Culture   Final    NO GROWTH Performed at [facility], [place] [address] [place], [place] [address]    Report Status 02/17/2020 FINAL  Final  Blood Culture ID Panel (Reflexed)     Status: Abnormal   Collection Time: 02/16/20  2:12 PM  Result Value Ref Range Status   Enterococcus species NOT DETECTED NOT DETECTED Final   Listeria monocytogenes NOT DETECTED NOT DETECTED Final   Staphylococcus species DETECTED (A) NOT DETECTED Final    Comment: Methicillin (oxacillin) resistant coagulase negative staphylococcus. Possible blood culture contaminant (unless isolated from more than one blood culture draw or clinical case suggests pathogenicity). No antibiotic treatment is indicated for blood  culture contaminants. CRITICAL RESULT CALLED TO, READ BACK BY AND VERIFIED WITH: GRACE  BARR AT 0800 ON 02/17/20 SNG    Staphylococcus aureus (BCID) NOT DETECTED NOT DETECTED Final   Methicillin resistance DETECTED (A) NOT DETECTED Final    Comment: CRITICAL RESULT CALLED TO, READ BACK BY AND VERIFIED WITH: GRACE BARR AT 0800 ON 02/17/20 SNG    Streptococcus species NOT DETECTED NOT DETECTED Final   Streptococcus agalactiae NOT DETECTED NOT DETECTED Final   Streptococcus pneumoniae NOT DETECTED NOT DETECTED Final   Streptococcus pyogenes NOT DETECTED NOT DETECTED Final   Acinetobacter baumannii NOT DETECTED NOT DETECTED Final   Enterobacteriaceae species NOT DETECTED NOT DETECTED Final   Enterobacter cloacae complex NOT DETECTED NOT DETECTED Final   Escherichia coli NOT DETECTED NOT DETECTED Final   Klebsiella oxytoca NOT DETECTED NOT DETECTED Final   Klebsiella pneumoniae NOT DETECTED NOT DETECTED Final   Proteus species NOT DETECTED NOT DETECTED Final   Serratia marcescens NOT DETECTED NOT DETECTED Final   Haemophilus influenzae NOT DETECTED NOT DETECTED Final   Neisseria meningitidis NOT DETECTED NOT DETECTED Final  Pseudomonas aeruginosa NOT DETECTED NOT DETECTED Final   Candida albicans NOT DETECTED NOT DETECTED Final   Candida glabrata NOT DETECTED NOT DETECTED Final   Candida krusei NOT DETECTED NOT DETECTED Final   Candida parapsilosis NOT DETECTED NOT DETECTED Final   Candida tropicalis NOT DETECTED NOT DETECTED Final    Comment: Performed at [facility], [address], [place], [place] [address]  Respiratory Panel by RT PCR (Flu A&B, Covid) - Nasopharyngeal Swab     Status: None   Collection Time: 02/16/20  2:13 PM   Specimen: Nasopharyngeal Swab  Result Value Ref Range Status   SARS Coronavirus 2 by RT PCR NEGATIVE NEGATIVE Final    Comment: (NOTE) SARS-CoV-2 target nucleic acids are NOT DETECTED. The SARS-CoV-2 RNA is generally detectable in upper respiratoy specimens during the acute phase of infection. The lowest concentration of SARS-CoV-2  viral copies this assay can detect is 131 copies/mL. A negative result does not preclude SARS-Cov-2 infection and should not be used as the sole basis for treatment or other patient management decisions. A negative result may occur with  improper specimen collection/handling, submission of specimen other than nasopharyngeal swab, presence of viral mutation(s) within the areas targeted by this assay, and inadequate number of viral copies (<131 copies/mL). A negative result must be combined with clinical observations, patient history, and epidemiological information. The expected result is Negative. Fact Sheet for Patients:  PinkCheek.be Fact Sheet for Healthcare Providers:  GravelBags.it This test is not yet ap proved or cleared by the [place] FDA and  has been authorized for detection and/or diagnosis of SARS-CoV-2 by FDA under an Emergency Use Authorization (EUA). This EUA will remain  in effect (meaning this test can be used) for the duration of the COVID-19 declaration under Section 564(b)(1) of the Act, 21 U.S.C. section 360bbb-3(b)(1), unless the authorization is terminated or revoked sooner.    Influenza A by PCR NEGATIVE NEGATIVE Final   Influenza B by PCR NEGATIVE NEGATIVE Final    Comment: (NOTE) The Xpert Xpress SARS-CoV-2/FLU/RSV assay is intended as an aid in  the diagnosis of influenza from Nasopharyngeal swab specimens and  should not be used as a sole basis for treatment. Nasal washings and  aspirates are unacceptable for Xpert Xpress SARS-CoV-2/FLU/RSV  testing. Fact Sheet for Patients: PinkCheek.be Fact Sheet for Healthcare Providers: GravelBags.it This test is not yet approved or cleared by the [place] FDA and  has been authorized for detection and/or diagnosis of SARS-CoV-2 by  FDA under an Emergency Use Authorization (EUA). This EUA will  remain  in effect (meaning this test can be used) for the duration of the  Covid-19 declaration under Section 564(b)(1) of the Act, 21  U.S.C. section 360bbb-3(b)(1), unless the authorization is  terminated or revoked. Performed at [facility], [address] [place], [place] [address]   Culture, blood (Routine x 2)     Status: None   Collection Time: 02/16/20  2:27 PM   Specimen: BLOOD  Result Value Ref Range Status   Specimen Description BLOOD RIGHT ANTECUBITAL  Final   Special Requests   Final    BOTTLES DRAWN AEROBIC AND ANAEROBIC Blood Culture adequate volume   Culture   Final    NO GROWTH 5 DAYS Performed at [facility], [address] [place], [place] [address]    Report Status 02/21/2020 FINAL  Final  CSF culture     Status: None   Collection Time: 02/16/20  3:28 PM  Specimen: CSF; Cerebrospinal Fluid  Result Value Ref Range Status   Specimen Description   Final    CSF Performed at [facility], [address], [place], [place] [address]    Special Requests   Final    NONE Performed at [facility], [address] [place], [place] [address]    Gram Stain   Final    NO ORGANISMS SEEN NO RBCS OR WBCS SEEN Performed at [facility], [address] [place], [place] [address]    Culture   Final    NO GROWTH 3 DAYS Performed at [facility], [place] [address], [place], [place] [address]    Report Status 02/20/2020 FINAL  Final  MRSA PCR Screening     Status: None   Collection Time: 02/16/20  7:13 PM   Specimen: Nasal Mucosa; Nasopharyngeal  Result Value Ref Range Status   MRSA by PCR NEGATIVE NEGATIVE Final    Comment:        The GeneXpert MRSA Assay (FDA approved for NASAL specimens only), is one component of a comprehensive MRSA colonization surveillance program. It is not intended to diagnose MRSA infection nor to guide or monitor treatment for MRSA infections. Performed at [place], [place] [address], [place], [place] [address]   Culture, blood (routine x 2)     Status: None (Preliminary result)   Collection Time: 02/17/20 10:33 AM   Specimen: BLOOD LEFT HAND  Result Value Ref Range Status   Specimen Description BLOOD LEFT HAND  Final   Special Requests   Final    BOTTLES DRAWN AEROBIC ONLY Blood Culture adequate volume   Culture   Final    NO GROWTH 4 DAYS Performed at [facility], [place] [address] [place], [place] [address]    Report Status PENDING  Incomplete  Culture, blood (routine x 2)     Status: None (Preliminary result)   Collection Time: 02/17/20 10:33 AM   Specimen: BLOOD LEFT HAND  Result Value Ref Range Status   Specimen Description BLOOD LEFT HAND  Final   Special Requests   Final    BOTTLES DRAWN AEROBIC ONLY Blood Culture adequate volume   Culture   Final    NO GROWTH 4 DAYS Performed at [facility], [place] [address], [place], [place] [address]    Report Status PENDING  Incomplete  Culture, respiratory (non-expectorated)     Status: None   Collection Time: 02/17/20 11:43 AM   Specimen: Tracheal Aspirate; Respiratory  Result Value Ref Range Status   Specimen Description TRACHEAL ASPIRATE  Final   Special Requests NONE  Final   Gram Stain   Final    MODERATE WBC PRESENT,BOTH PMN AND MONONUCLEAR NO ORGANISMS SEEN    Culture   Final    Consistent with normal respiratory flora. Performed at [facility], [place] [address] [place], [place] [address]    Report Status 02/19/2020 FINAL  Final         Radiology Studies: No results found.      Scheduled Meds: . aspirin  81 mg Oral Daily  . Chlorhexidine Gluconate Cloth  6 each Topical Daily  . clopidogrel  75 mg Oral Daily  . docusate sodium  100 mg Oral Daily  . enoxaparin (LOVENOX) injection  40 mg Subcutaneous Q24H  . feeding supplement (GLUCERNA SHAKE)  237 mL Oral BID BM  . insulin aspart  0-15 Units Subcutaneous Q4H  .  mouth rinse  15 mL Mouth Rinse q12n4p  .  multivitamin with minerals  1 tablet Oral Daily  . pantoprazole  40 mg Oral Daily  . polyethylene glycol  17 g Per Tube Daily  . rosuvastatin  10 mg Oral Daily  . sodium chloride flush  10-40 mL Intracatheter Q12H   Continuous Infusions: . sodium chloride Stopped (02/17/20 2357)  . lactated ringers 50 mL/hr at 02/20/20 1750  . levETIRAcetam 1,000 mg (02/20/20 2223)     LOS: 5 days    Time spent:40 min    Milcah Dulany, Geraldo Docker, MD [facility] Pager [phone]  If 7PM-7AM, please contact night-coverage [email or web address] Password [facility] 02/21/2020, 7:42 AM

## 2020-02-21 NOTE — Progress Notes (Signed)
PT Cancellation Note  Patient Details Name: Mallory Peters MRN: [id number] DOB: 11-Oct-1941   Cancelled Treatment:    Reason Eval/Treat Not Completed: Other (comment)(new order received due to pt cognition. No apparent change from prior session with son reporting 24hr care at home and wanting pt to return home. Will not see on this date but will resume services next date. If son/family no longer able to provide 24hr then SNF would be appropriate)   Cyan Clippinger B Raymund Manrique 02/21/2020, 6:56 AM [place] Pager: [phone] Office: [phone]

## 2020-02-22 LAB — CBC WITH DIFFERENTIAL/PLATELET
Abs Immature Granulocytes: 0.01 10*3/uL (ref 0.00–0.07)
Basophils Absolute: 0 10*3/uL (ref 0.0–0.1)
Basophils Relative: 1 %
Eosinophils Absolute: 0.2 10*3/uL (ref 0.0–0.5)
Eosinophils Relative: 3 %
HCT: 30.9 % — ABNORMAL LOW (ref 36.0–46.0)
Hemoglobin: 10.2 g/dL — ABNORMAL LOW (ref 12.0–15.0)
Immature Granulocytes: 0 %
Lymphocytes Relative: 18 %
Lymphs Abs: 1 10*3/uL (ref 0.7–4.0)
MCH: 28.7 pg (ref 26.0–34.0)
MCHC: 33 g/dL (ref 30.0–36.0)
MCV: 86.8 fL (ref 80.0–100.0)
Monocytes Absolute: 0.5 10*3/uL (ref 0.1–1.0)
Monocytes Relative: 10 %
Neutro Abs: 3.6 10*3/uL (ref 1.7–7.7)
Neutrophils Relative %: 68 %
Platelets: 136 10*3/uL — ABNORMAL LOW (ref 150–400)
RBC: 3.56 MIL/uL — ABNORMAL LOW (ref 3.87–5.11)
RDW: 13.6 % (ref 11.5–15.5)
WBC: 5.3 10*3/uL (ref 4.0–10.5)
nRBC: 0 % (ref 0.0–0.2)

## 2020-02-22 LAB — CULTURE, BLOOD (ROUTINE X 2)
Culture: NO GROWTH
Culture: NO GROWTH
Special Requests: ADEQUATE
Special Requests: ADEQUATE

## 2020-02-22 LAB — COMPREHENSIVE METABOLIC PANEL
ALT: 19 U/L (ref 0–44)
AST: 24 U/L (ref 15–41)
Albumin: 2.8 g/dL — ABNORMAL LOW (ref 3.5–5.0)
Alkaline Phosphatase: 44 U/L (ref 38–126)
Anion gap: 7 (ref 5–15)
BUN: 5 mg/dL — ABNORMAL LOW (ref 8–23)
CO2: 26 mmol/L (ref 22–32)
Calcium: 8.4 mg/dL — ABNORMAL LOW (ref 8.9–10.3)
Chloride: 111 mmol/L (ref 98–111)
Creatinine, Ser: 0.63 mg/dL (ref 0.44–1.00)
GFR calc Af Amer: >60 mL/min (ref >60)
GFR calc non Af Amer: >60 mL/min (ref >60)
Glucose, Bld: 97 mg/dL (ref 70–99)
Potassium: 3.7 mmol/L (ref 3.5–5.1)
Sodium: 144 mmol/L (ref 135–145)
Total Bilirubin: 1.1 mg/dL (ref 0.3–1.2)
Total Protein: 4.9 g/dL — ABNORMAL LOW (ref 6.5–8.1)

## 2020-02-22 LAB — PHOSPHORUS: Phosphorus: 2.8 mg/dL (ref 2.5–4.6)

## 2020-02-22 LAB — GLUCOSE, CAPILLARY
Glucose-Capillary: 97 mg/dL (ref 70–99)
Glucose-Capillary: 92 mg/dL (ref 70–99)
Glucose-Capillary: 100 mg/dL — ABNORMAL HIGH (ref 70–99)

## 2020-02-22 LAB — MAGNESIUM: Magnesium: 2.2 mg/dL (ref 1.7–2.4)

## 2020-02-22 MED ORDER — LEVETIRACETAM 1000 MG PO TABS
1000.0000 mg | ORAL_TABLET | Freq: Two times a day (BID) | ORAL | 0 refills | Status: DC
Start: 2020-02-22 — End: 2022-04-11

## 2020-02-22 MED ORDER — POTASSIUM CHLORIDE CRYS ER 20 MEQ PO TBCR: 50.0000 meq | EXTENDED_RELEASE_TABLET | Freq: Once | ORAL | Status: DC

## 2020-02-22 MED ORDER — DOCUSATE SODIUM 100 MG PO CAPS: 100.0000 mg | ORAL_CAPSULE | Freq: Every day | ORAL | 0 refills | Status: DC

## 2020-02-22 MED ORDER — PANTOPRAZOLE SODIUM 40 MG PO TBEC: 40.0000 mg | DELAYED_RELEASE_TABLET | Freq: Every day | ORAL | 0 refills | Status: DC

## 2020-02-22 MED ORDER — POTASSIUM CHLORIDE CRYS ER 20 MEQ PO TBCR
40.0000 meq | EXTENDED_RELEASE_TABLET | Freq: Once | ORAL | Status: AC
  Administered 2020-02-22: 40 meq via ORAL
  Filled 2020-02-22: qty 2

## 2020-02-22 MED ORDER — POLYETHYLENE GLYCOL 3350 17 G PO PACK: 17.0000 g | PACK | Freq: Every day | ORAL | 0 refills | Status: DC

## 2020-02-22 MED FILL — POLYETHYLENE GLYCOL 3350 PO: 17 | 14 days supply | Qty: 238 | Fill #0

## 2020-02-22 MED FILL — PANTOPRAZOLE SOD DR 40 MG T: 40 | 30 days supply | Qty: 30 | Fill #0

## 2020-02-22 MED FILL — DOK 100 MG CAPS: 100 | 10 days supply | Qty: 10 | Fill #0

## 2020-02-22 MED FILL — levETIRAcetam 500 MG TABS: 500 | 30 days supply | Qty: 120 | Fill #0

## 2020-02-22 NOTE — Care Management Important Message (Signed)
Important Message  Patient Details  Name: Mallory Peters MRN: [id number] Date of Birth: 30-Apr-1941   Medicare Important Message Given:  Yes     [name] 02/22/2020, 10:25 AM

## 2020-02-22 NOTE — Discharge Summary (Signed)
Physician Discharge Summary  Mallory Peters [id number] DOB: [birth year] DOA: 02/16/2020  PCP: Idelle Crouch, MD  Admit date: 02/16/2020 Discharge date: 02/22/2020  Time spent: 30 minutes  Recommendations for Outpatient Follow-up:   Seizure disorder/status epilepticus -Status epilepticus resolved; -Per neurology note 4/29 plan; -Keppra 1000 mg BID -Seizure precautions -4/27 EEG;recorded evidence of a focal left frontotemporal cerebral dysfunction, see results below -Per [facility] statutes, patients with seizures are not allowed to drive until they have been seizure-free for six months and cleared by a neurologist -Schedule follow-up appointment with Dr. Gurney Maxin in 8 weeks post discharge -Repeat MRI brain 8 to 12 weeks post discharge  Left frontal meningioma -Because of status epilepticus? -Neurology consulted -See seizure disorder  Cerebral edema -HSV meningitis? -Very low likelihood. -Empiric Acyclovir continue   Hx of dementia. -Donezepil 5 mg daily  Hx of depression, anxiety, insomnia. - hold outpt xanax -Restart home zoloft   Compromised airway in setting of seizure. -Resolved patient on room air  Fever. -Resolved.  No indication for empiric antibiotics, empiric acyclovir discontinued   Chronic diastolic CHF -Strict in and out +4.7 L -Daily weight  Filed Weights   02/18/20 0500 02/19/20 0400 02/21/20 0500  Weight: 57.1 kg 60.1 kg 56.2 kg   Hypokalemia -Potassium goal> 4 -K-Dur 40 mEq prior to discharge  Hypomagnesmia -Magnesium goal> 2  Hypophosphatemia -Phosphorus goal> 2.5  Hyperglycemia. - no hx of DM  Elevated troponin/demand ischemia -Secondary to seizure  Hx of CAD, CVA, HLD. - f/u Echo - continue ASA, plavix, crestor   Discharge Diagnoses:  Active Problems:   Seizures (HCC)   Chronic diastolic CHF (congestive heart failure) (HCC)   Meningioma (HCC)   Depression   Anxiety   Elevated troponin    Demand ischemia (HCC)   HLD (hyperlipidemia)   Discharge Condition: Stable  Diet recommendation: Dysphagia 3 fluid consistency thin  Filed Weights   02/18/20 0500 02/19/20 0400 02/21/20 0500  Weight: 57.1 kg 60.1 kg 56.2 kg    History of present illness:  79 yo WF PMHx CVA.Anxiety  Depression, Dementia,Osteoarthritis, Meningioma, HLD, HTN, Insomnia, GERD, Esophageal dysmotility, CAD, Colon polyps, B12 deficiency,   Found by son at home. Reported have 79 minutes tonic/clonic seizure after [facility] arrived. Hx of insomnia on chronic xanax. Intubated for airway protection. Transfer to [facility] for further neuro assessment.  Hospital Course:  See above  Procedures: 4/27 transfer from [facility] to [facility] ETT 4/27 >> 4/27 CT head  >> 1.2 cm Lt frontal convexity meningioma with possible trace vasogenic edema 4/27 LP  >> 0 WBC, 0 RBC, Glucose 80, Protein 71, gram stain negative 4/27 EEG; This study recorded evidence of a focal left frontotemporal cerebral dysfunction. There was no seizure or definite evidence of seizure predisposition recorded on this study. Please note that lack of epileptiform activity on EEG does not preclude the possibility of epilepsy.   4/28 echocardiogram;Left Ventricle: LVEF=60 to 65%.  -Grade I diastolic dysfunction (impaired relaxation). Normal left  ventricular filling pressure.  Right Ventricle: mildly elevated PASP:  estimated right ventricular systolic pressure is 0000000 mmHg.    Consultations: Neurology  Cultures SARS CoV2 PCR 4/27 >> negative Influenza PCR 4/27 >> negative 4/28 blood positive coag negative staph contaminant 4/27Urine negative  4/27CSF negative 4/27 CSF HSV 1/2 negative   Antimicrobials: Anti-infectives (From admission, onward)   Start     Dose/Rate Stop   02/17/20 1900  acyclovir (ZOVIRAX) 500 mg in dextrose 5 % 100 mL IVPB  Status:  Discontinued     10 mg/kg  50.1 kg (Ideal) 110 mL/hr over 60 Minutes 02/19/20 0841   02/17/20 1430   cefTRIAXone (ROCEPHIN) 2 g in sodium chloride 0.9 % 100 mL IVPB  Status:  Discontinued     2 g 200 mL/hr over 30 Minutes 02/19/20 0841       Discharge Exam: Vitals:   02/21/20 2011 02/21/20 2337 02/22/20 0404 02/22/20 0733  BP: 127/78 115/67 117/72   Pulse: 98 93 92   Resp: 20 16 20    Temp: 98.3 F (36.8 C) 98.9 F (37.2 C) 98.3 F (36.8 C) 98.4 F (36.9 C)  TempSrc: Oral Oral Oral Oral  SpO2: 94% 93% 96%   Weight:      Height:        General: A/O x1 (does not know where, when, why).  Does know she is in the hospital.  No acute respiratory distress Eyes: negative scleral hemorrhage, negative anisocoria, negative icterus ENT: Negative Runny nose, negative gingival bleeding, Neck:  Negative scars, masses, torticollis, lymphadenopathy, JVD Lungs: Clear to auscultation bilaterally without wheezes or crackles Cardiovascular: Regular rate and rhythm without murmur gallop or rub normal S1 and S2   Discharge Instructions   Allergies as of 02/22/2020      Reactions   Ciprofloxacin Other (See Comments)   Hallucinations   Metronidazole Other (See Comments)   Hallucinations   Norco [hydrocodone-acetaminophen]    Drowsy and confused      Medication List    STOP taking these medications   risperiDONE 0.25 MG tablet Commonly known as: RISPERDAL   Xanax 0.25 MG tablet Generic drug: ALPRAZolam     TAKE these medications   aspirin EC 81 MG tablet Take 81 mg by mouth daily.   clopidogrel 75 MG tablet Commonly known as: PLAVIX Take 75 mg by mouth daily.   docusate sodium 100 MG capsule Commonly known as: COLACE Take 1 capsule (100 mg total) by mouth daily.   donepezil 5 MG tablet Commonly known as: ARICEPT Take 5 mg by mouth every evening.   levETIRAcetam 1000 MG tablet Commonly known as: Keppra Take 1 tablet (1,000 mg total) by mouth 2 (two) times daily.   pantoprazole 40 MG tablet Commonly known as: PROTONIX Take 1 tablet (40 mg total) by mouth daily.    polyethylene glycol 17 g packet Commonly known as: MIRALAX / GLYCOLAX Take 17 g by mouth daily.   rosuvastatin 10 MG tablet Commonly known as: CRESTOR Take 10 mg by mouth daily.   sertraline 25 MG tablet Commonly known as: ZOLOFT Take 75 mg by mouth at bedtime.   vitamin B-12 1000 MCG tablet Commonly known as: CYANOCOBALAMIN Take 1,000 mcg by mouth daily.   Vitamin D3 25 MCG (1000 UT) Caps Take 1,000 Units by mouth daily.      Allergies  Allergen Reactions  . Ciprofloxacin Other (See Comments)    Hallucinations  . Metronidazole Other (See Comments)    Hallucinations  . Norco [Hydrocodone-Acetaminophen]     Drowsy and confused   Follow-up Information    Sparks, Leonie Douglas, MD.   Specialty: Internal Medicine Contact information: [address] [place] [address] [phone]        [facility]. Schedule an appointment as soon as possible for a visit in 8 week(s).   Why: Schedule follow-up appointment with Dr. Gurney Maxin in 8 weeks post discharge  Contact information: [address]  [place] [phone]           The results of significant diagnostics from this hospitalization (including imaging, microbiology, ancillary and laboratory) are listed below for reference.    Significant Diagnostic Studies: CT Head Wo Contrast  Result Date: 02/16/2020 CLINICAL DATA:  New onset seizure. EXAM: CT HEAD WITHOUT CONTRAST TECHNIQUE: Contiguous axial images were obtained from the base of the skull through the vertex without intravenous contrast. COMPARISON:  Noncontrast head CT 08/26/2019, brain MRI 08/28/2019 FINDINGS: Brain: A partially calcified meningioma overlying the left frontal lobe convexity has remained stable in size as compared to head CT 08/26/2019, again measuring 1.2 cm (series 5, image 12) (series 3, image 22). There may be trace vasogenic edema within the  underlying left frontal lobe (series 3, image 21). Stable mild-to-moderate patchy hypodensity within the cerebral white matter which is nonspecific, but consistent with chronic small vessel ischemic disease. Stable, mild generalized parenchymal atrophy. There is no acute intracranial hemorrhage. No demarcated cortical infarct. No extra-axial fluid collection. No evidence of intracranial mass. No midline shift. Vascular: No hyperdense vessel.  Atherosclerotic calcifications. Skull: Normal. Negative for fracture or focal lesion. Sinuses/Orbits: Visualized orbits show no acute finding. Mild ethmoid sinus mucosal thickening. No significant mastoid effusion. IMPRESSION: 1. No evidence of acute intracranial hemorrhage or acute infarct. 2. Stable 1.2 cm left frontal convexity meningioma. There may be trace vasogenic edema within the underlying left frontal lobe. 3. Stable mild generalized parenchymal atrophy and mild-to-moderate chronic small vessel ischemic disease. 4. Mild ethmoid sinus mucosal thickening. Electronically Signed   By: Kellie Simmering DO   On: 02/16/2020 15:13   MR BRAIN WO CONTRAST  Result Date: 02/17/2020 CLINICAL DATA:  Encephalopathy. Additional history obtained from [place]. EXAM: MRI HEAD WITHOUT CONTRAST TECHNIQUE: Multiplanar, multiecho pulse sequences of the brain and surrounding structures were obtained without intravenous contrast. COMPARISON:  Noncontrast head CT 02/16/2020, brain MRI 12/26/2018 FINDINGS: Brain: There is restricted diffusion and T2/FLAIR hyperintensity within the medial left temporal lobe/hippocampus and extending into the left fornix with associated mild swelling. Similar but more subtle signal changes are also questioned within the medial right temporal lobe/hippocampus. Stable moderate patchy T2/FLAIR hyperintensity within the cerebral white matter which is nonspecific, but consistent with chronic small vessel ischemic disease. Stable, moderate  generalized parenchymal atrophy. Redemonstrated 1.2 cm partially calcified meningioma overlying the left frontal lobe convexity. The mass contacts the underlying left frontal lobe without underlying parenchymal edema. No chronic intracranial blood products. No extra-axial fluid collection. No midline shift. Vascular: Expected proximal arterial flow voids. Skull and upper cervical spine: No focal marrow lesion. Sinuses/Orbits: Visualized orbits show no acute finding. Mild diffuse paranasal sinus mucosal thickening. No significant mastoid effusion. These results were called by telephone at the time of interpretation on 02/17/2020 at 5:24 pm to provider Hayden Pedro , who verbally acknowledged these results. IMPRESSION: Restricted diffusion and T2 hyperintensity within the medial left temporal lobe/hippocampus and adjacent left fornix with mild associated parenchymal swelling. Similar although more subtle signal changes are questioned within the medial right temporal lobe/hippocampus. Findings may reflect seizure related changes. This imaging appearance can also be seen in setting of HSV encephalitis and clinical correlation is recommended. Additionally, MRI follow-up is recommended following a period of seizure control to ensure resolution of signal abnormality at these sites. Stable moderate generalized parenchymal atrophy and chronic small vessel ischemic disease. Redemonstrated 1.2 cm partially calcified meningioma overlying the left frontal lobe convexity. Mild paranasal sinus mucosal thickening. Electronically Signed  By: Kellie Simmering DO   On: 02/17/2020 17:33   DG Chest Port 1 View  Result Date: 02/18/2020 CLINICAL DATA:  ETT, seizures EXAM: PORTABLE CHEST 1 VIEW COMPARISON:  02/17/2020 FINDINGS: No significant change in AP portable examination. Mild, diffuse interstitial pulmonary opacity, unchanged, possibly with small layering left pleural effusion. No new airspace opacity. Endotracheal tube remains  at the carina. Esophagogastric tube with tip and side port below the diaphragm. Heart and mediastinum are unremarkable. IMPRESSION: 1. No significant change in AP portable examination. Mild, diffuse interstitial pulmonary opacity, unchanged, possibly with small layering left pleural effusion. No new airspace opacity. 2. Endotracheal tube remains at the carina. Recommend retraction. Esophagogastric tube with tip and side port below the diaphragm. Electronically Signed   By: Eddie Candle M.D.   On: 02/18/2020 08:11   DG Chest Port 1 View  Result Date: 02/17/2020 CLINICAL DATA:  Endotracheally intubated. Seizure. EXAM: PORTABLE CHEST 1 VIEW COMPARISON:  02/16/2020 FINDINGS: Endotracheal tube tip is now less than 1 cm above the carina. Nasogastric tube is in appropriate position with tip overlying the gastric fundus. Increased opacity in the left retrocardiac lung base may be due to atelectasis or infiltrate. Right lung is clear. No evidence of pneumothorax or pleural effusion. IMPRESSION: 1. Endotracheal tube tip is now less than 1 cm above the carina. 2. Increased left retrocardiac atelectasis versus infiltrate. Electronically Signed   By: Marlaine Hind M.D.   On: 02/17/2020 08:04   DG Chest Port 1 View  Result Date: 02/16/2020 CLINICAL DATA:  Hypoxia.  Fever. EXAM: PORTABLE CHEST 1 VIEW COMPARISON:  August 26, 2019 FINDINGS: Endotracheal tube tip is 3.4 cm above the carina. Nasogastric tube tip and side port are in the stomach. No pneumothorax. There is no edema or airspace opacity. Heart size and pulmonary vascularity are normal. No adenopathy. There is aortic atherosclerosis. There is degenerative change in each shoulder. There is lower thoracic levoscoliosis. IMPRESSION: Tube positions as described without pneumothorax. Lungs clear. Cardiac silhouette normal. Aortic Atherosclerosis (ICD10-I70.0). Electronically Signed   By: Lowella Grip III M.D.   On: 02/16/2020 14:50   ECHOCARDIOGRAM  COMPLETE  Result Date: 02/17/2020    ECHOCARDIOGRAM REPORT   Patient Name:   Mallory Peters Date of Exam: 02/17/2020 Medical Rec #:  [id number]            Height:       62.0 in Accession #:    [id number]           Weight:       129.6 lb Date of Birth:  Mar 29, 1941            BSA:          1.590 m Patient Age:    79 years             BP:           103/64 mmHg Patient Gender: F                    HR:           70 bpm. Exam Location:  Inpatient Procedure: 2D Echo, Cardiac Doppler, Color Doppler and Intracardiac            Opacification Agent Indications:    Elevated troponin  History:        Patient has no prior history of Echocardiogram examinations.                 Risk  Factors:Non-Smoker.  Sonographer:    Vickie Epley RDCS Referring Phys: [id number] RAHUL P DESAI IMPRESSIONS  1. Left ventricular ejection fraction, by estimation, is 60 to 65%. The left ventricle has normal function. The left ventricle has no regional wall motion abnormalities. Left ventricular diastolic parameters are consistent with Grade I diastolic dysfunction (impaired relaxation). There is akinesis of the left ventricular, basal inferior wall.  2. Right ventricular systolic function is normal. The right ventricular size is normal. There is mildly elevated pulmonary artery systolic pressure. The estimated right ventricular systolic pressure is 0000000 mmHg.  3. The mitral valve is normal in structure. Trivial mitral valve regurgitation. No evidence of mitral stenosis.  4. The aortic valve is normal in structure. Aortic valve regurgitation is not visualized. No aortic stenosis is present.  5. The inferior vena cava is dilated in size with <50% respiratory variability, suggesting right atrial pressure of 15 mmHg. FINDINGS  Left Ventricle: Left ventricular ejection fraction, by estimation, is 60 to 65%. The left ventricle has normal function. The left ventricle has no regional wall motion abnormalities. Definity contrast agent was given IV to  delineate the left ventricular  endocardial borders. The left ventricular internal cavity size was normal in size. There is no left ventricular hypertrophy. Left ventricular diastolic parameters are consistent with Grade I diastolic dysfunction (impaired relaxation). Normal left ventricular filling pressure. Right Ventricle: The right ventricular size is normal. No increase in right ventricular wall thickness. Right ventricular systolic function is normal. There is mildly elevated pulmonary artery systolic pressure. The tricuspid regurgitant velocity is 2.38  m/s, and with an assumed right atrial pressure of 15 mmHg, the estimated right ventricular systolic pressure is 0000000 mmHg. Left Atrium: Left atrial size was normal in size. Right Atrium: Right atrial size was normal in size. Pericardium: There is no evidence of pericardial effusion. Mitral Valve: The mitral valve is normal in structure. Normal mobility of the mitral valve leaflets. Trivial mitral valve regurgitation. No evidence of mitral valve stenosis. Tricuspid Valve: The tricuspid valve is normal in structure. Tricuspid valve regurgitation is mild . No evidence of tricuspid stenosis. Aortic Valve: The aortic valve is normal in structure. Aortic valve regurgitation is not visualized. No aortic stenosis is present. Pulmonic Valve: The pulmonic valve was normal in structure. Pulmonic valve regurgitation is trivial. No evidence of pulmonic stenosis. Aorta: The aortic root is normal in size and structure. Venous: The inferior vena cava is dilated in size with less than 50% respiratory variability, suggesting right atrial pressure of 15 mmHg. IAS/Shunts: The interatrial septum appears to be lipomatous. No atrial level shunt detected by color flow Doppler.  LEFT VENTRICLE PLAX 2D LVIDd:         4.10 cm     Diastology LVIDs:         3.40 cm     LV e' lateral:   6.53 cm/s LV PW:         0.70 cm     LV E/e' lateral: 7.2 LV IVS:        0.70 cm     LV e' medial:     6.09 cm/s LVOT diam:     1.50 cm     LV E/e' medial:  7.7 LV SV:         31 LV SV Index:   20 LVOT Area:     1.77 cm  LV Volumes (MOD) LV vol d, MOD A2C: 60.6 ml LV vol d, MOD A4C: 75.4 ml LV vol s,  MOD A2C: 32.0 ml LV vol s, MOD A4C: 46.3 ml LV SV MOD A2C:     28.6 ml LV SV MOD A4C:     75.4 ml LV SV MOD BP:      31.3 ml RIGHT VENTRICLE RV S prime:     9.14 cm/s TAPSE (M-mode): 1.8 cm LEFT ATRIUM             Index       RIGHT ATRIUM          Index LA diam:        2.20 cm 1.38 cm/m  RA Area:     8.58 cm LA Vol (A2C):   34.8 ml 21.89 ml/m RA Volume:   16.25 ml 10.22 ml/m LA Vol (A4C):   21.0 ml 13.21 ml/m LA Biplane Vol: 27.9 ml 17.55 ml/m  AORTIC VALVE LVOT Vmax:   73.60 cm/s LVOT Vmean:  48.300 cm/s LVOT VTI:    0.176 m  AORTA Ao Root diam: 3.20 cm MITRAL VALVE               TRICUSPID VALVE MV Area (PHT): 4.15 cm    TR Peak grad:   22.7 mmHg MV Decel Time: 183 msec    TR Vmax:        238.00 cm/s MV E velocity: 47.00 cm/s MV A velocity: 66.20 cm/s  SHUNTS MV E/A ratio:  0.71        Systemic VTI:  0.18 m                            Systemic Diam: 1.50 cm Fransico Him MD Electronically signed by Fransico Him MD Signature Date/Time: 02/17/2020/4:20:23 PM    Final    [place] EKG SITE RITE  Result Date: 02/18/2020 If Site Rite image not attached, placement could not be confirmed due to current cardiac rhythm.   Microbiology: Recent Results (from the past 240 hour(s))  Culture, blood (Routine x 2)     Status: Abnormal   Collection Time: 02/16/20  2:12 PM   Specimen: BLOOD  Result Value Ref Range Status   Specimen Description   Final    BLOOD BLOOD RIGHT FOREARM Performed at [facility], [address], [place], [place] [address]    Special Requests   Final    BOTTLES DRAWN AEROBIC AND ANAEROBIC Blood Culture adequate volume Performed at [facility], [address] [place], [place] [address]    Culture  Setup Time   Final    GRAM POSITIVE COCCI ANAEROBIC BOTTLE ONLY CRITICAL  RESULT CALLED TO, READ BACK BY AND VERIFIED WITH: GRACE BARR AT 0800 ON 02/17/20 SNG    Culture (A)  Final    STAPHYLOCOCCUS SPECIES (COAGULASE NEGATIVE) THE SIGNIFICANCE OF ISOLATING THIS ORGANISM FROM A SINGLE SET OF BLOOD CULTURES WHEN MULTIPLE SETS ARE DRAWN IS UNCERTAIN. PLEASE NOTIFY THE MICROBIOLOGY DEPARTMENT WITHIN ONE WEEK IF SPECIATION AND SENSITIVITIES ARE REQUIRED. Performed at [facility], [place] [address] [place], [place] [address]    Report Status 02/19/2020 FINAL  Final  Urine culture     Status: None   Collection Time: 02/16/20  2:12 PM   Specimen: In/Out Cath Urine  Result Value Ref Range Status   Specimen Description   Final    IN/OUT CATH URINE Performed at [facility], [address] [place], [place] [address]    Special Requests   Final    NONE  Performed at [facility], [address], [place], [place] [address]    Culture   Final    NO GROWTH Performed at [facility], [place] [address], [place], [place] [address]    Report Status 02/17/2020 FINAL  Final  Blood Culture ID Panel (Reflexed)     Status: Abnormal   Collection Time: 02/16/20  2:12 PM  Result Value Ref Range Status   Enterococcus species NOT DETECTED NOT DETECTED Final   Listeria monocytogenes NOT DETECTED NOT DETECTED Final   Staphylococcus species DETECTED (A) NOT DETECTED Final    Comment: Methicillin (oxacillin) resistant coagulase negative staphylococcus. Possible blood culture contaminant (unless isolated from more than one blood culture draw or clinical case suggests pathogenicity). No antibiotic treatment is indicated for blood  culture contaminants. CRITICAL RESULT CALLED TO, READ BACK BY AND VERIFIED WITH: GRACE BARR AT 0800 ON 02/17/20 SNG    Staphylococcus aureus (BCID) NOT DETECTED NOT DETECTED Final   Methicillin resistance DETECTED (A) NOT DETECTED Final    Comment: CRITICAL RESULT CALLED TO, READ BACK BY AND VERIFIED WITH: GRACE BARR AT 0800 ON  02/17/20 SNG    Streptococcus species NOT DETECTED NOT DETECTED Final   Streptococcus agalactiae NOT DETECTED NOT DETECTED Final   Streptococcus pneumoniae NOT DETECTED NOT DETECTED Final   Streptococcus pyogenes NOT DETECTED NOT DETECTED Final   Acinetobacter baumannii NOT DETECTED NOT DETECTED Final   Enterobacteriaceae species NOT DETECTED NOT DETECTED Final   Enterobacter cloacae complex NOT DETECTED NOT DETECTED Final   Escherichia coli NOT DETECTED NOT DETECTED Final   Klebsiella oxytoca NOT DETECTED NOT DETECTED Final   Klebsiella pneumoniae NOT DETECTED NOT DETECTED Final   Proteus species NOT DETECTED NOT DETECTED Final   Serratia marcescens NOT DETECTED NOT DETECTED Final   Haemophilus influenzae NOT DETECTED NOT DETECTED Final   Neisseria meningitidis NOT DETECTED NOT DETECTED Final   Pseudomonas aeruginosa NOT DETECTED NOT DETECTED Final   Candida albicans NOT DETECTED NOT DETECTED Final   Candida glabrata NOT DETECTED NOT DETECTED Final   Candida krusei NOT DETECTED NOT DETECTED Final   Candida parapsilosis NOT DETECTED NOT DETECTED Final   Candida tropicalis NOT DETECTED NOT DETECTED Final    Comment: Performed at [facility], [address] [place], [place] [address]  Respiratory Panel by RT PCR (Flu A&B, Covid) - Nasopharyngeal Swab     Status: None   Collection Time: 02/16/20  2:13 PM   Specimen: Nasopharyngeal Swab  Result Value Ref Range Status   SARS Coronavirus 2 by RT PCR NEGATIVE NEGATIVE Final    Comment: (NOTE) SARS-CoV-2 target nucleic acids are NOT DETECTED. The SARS-CoV-2 RNA is generally detectable in upper respiratoy specimens during the acute phase of infection. The lowest concentration of SARS-CoV-2 viral copies this assay can detect is 131 copies/mL. A negative result does not preclude SARS-Cov-2 infection and should not be used as the sole basis for treatment or other patient management decisions. A negative result may occur with   improper specimen collection/handling, submission of specimen other than nasopharyngeal swab, presence of viral mutation(s) within the areas targeted by this assay, and inadequate number of viral copies (<131 copies/mL). A negative result must be combined with clinical observations, patient history, and epidemiological information. The expected result is Negative. Fact Sheet for Patients:  PinkCheek.be Fact Sheet for Healthcare Providers:  GravelBags.it This test is not yet ap proved or cleared by the [place] FDA and  has been authorized for detection and/or diagnosis  of SARS-CoV-2 by FDA under an Emergency Use Authorization (EUA). This EUA will remain  in effect (meaning this test can be used) for the duration of the COVID-19 declaration under Section 564(b)(1) of the Act, 21 U.S.C. section 360bbb-3(b)(1), unless the authorization is terminated or revoked sooner.    Influenza A by PCR NEGATIVE NEGATIVE Final   Influenza B by PCR NEGATIVE NEGATIVE Final    Comment: (NOTE) The Xpert Xpress SARS-CoV-2/FLU/RSV assay is intended as an aid in  the diagnosis of influenza from Nasopharyngeal swab specimens and  should not be used as a sole basis for treatment. Nasal washings and  aspirates are unacceptable for Xpert Xpress SARS-CoV-2/FLU/RSV  testing. Fact Sheet for Patients: PinkCheek.be Fact Sheet for Healthcare Providers: GravelBags.it This test is not yet approved or cleared by the [place] FDA and  has been authorized for detection and/or diagnosis of SARS-CoV-2 by  FDA under an Emergency Use Authorization (EUA). This EUA will remain  in effect (meaning this test can be used) for the duration of the  Covid-19 declaration under Section 564(b)(1) of the Act, 21  U.S.C. section 360bbb-3(b)(1), unless the authorization is  terminated or revoked. Performed at  [facility], [address] [place], [place] [address]   Culture, blood (Routine x 2)     Status: None   Collection Time: 02/16/20  2:27 PM   Specimen: BLOOD  Result Value Ref Range Status   Specimen Description BLOOD RIGHT ANTECUBITAL  Final   Special Requests   Final    BOTTLES DRAWN AEROBIC AND ANAEROBIC Blood Culture adequate volume   Culture   Final    NO GROWTH 5 DAYS Performed at [facility], [address] [place], [place] [address]    Report Status 02/21/2020 FINAL  Final  CSF culture     Status: None   Collection Time: 02/16/20  3:28 PM   Specimen: CSF; Cerebrospinal Fluid  Result Value Ref Range Status   Specimen Description   Final    CSF Performed at [facility], [address] [place], [place] [address]    Special Requests   Final    NONE Performed at [facility], [address], [place], [place] [address]    Gram Stain   Final    NO ORGANISMS SEEN NO RBCS OR WBCS SEEN Performed at [facility], [address], [place], [place] [address]    Culture   Final    NO GROWTH 3 DAYS Performed at [facility], [place] [address] [place], [place] [address]    Report Status 02/20/2020 FINAL  Final  MRSA PCR Screening     Status: None   Collection Time: 02/16/20  7:13 PM   Specimen: Nasal Mucosa; Nasopharyngeal  Result Value Ref Range Status   MRSA by PCR NEGATIVE NEGATIVE Final    Comment:        The GeneXpert MRSA Assay (FDA approved for NASAL specimens only), is one component of a comprehensive MRSA colonization surveillance program. It is not intended to diagnose MRSA infection nor to guide or monitor treatment for MRSA infections. Performed at [facility], [place] [address], [place],  [address]   Culture, blood (routine x 2)     Status: None   Collection Time: 02/17/20 10:33 AM   Specimen: BLOOD LEFT HAND  Result Value Ref Range Status   Specimen Description BLOOD LEFT HAND  Final    Special Requests   Final  BOTTLES DRAWN AEROBIC ONLY Blood Culture adequate volume   Culture   Final    NO GROWTH 5 DAYS Performed at [facility], [place] [address], [place], [place] [address]    Report Status 02/22/2020 FINAL  Final  Culture, blood (routine x 2)     Status: None   Collection Time: 02/17/20 10:33 AM   Specimen: BLOOD LEFT HAND  Result Value Ref Range Status   Specimen Description BLOOD LEFT HAND  Final   Special Requests   Final    BOTTLES DRAWN AEROBIC ONLY Blood Culture adequate volume   Culture   Final    NO GROWTH 5 DAYS Performed at [facility], [place] [address], [place], [place] [address]    Report Status 02/22/2020 FINAL  Final  Culture, respiratory (non-expectorated)     Status: None   Collection Time: 02/17/20 11:43 AM   Specimen: Tracheal Aspirate; Respiratory  Result Value Ref Range Status   Specimen Description TRACHEAL ASPIRATE  Final   Special Requests NONE  Final   Gram Stain   Final    MODERATE WBC PRESENT,BOTH PMN AND MONONUCLEAR NO ORGANISMS SEEN    Culture   Final    Consistent with normal respiratory flora. Performed at [facility], [place] [address] [place], [place] [address]    Report Status 02/19/2020 FINAL  Final     Labs: Basic Metabolic Panel: Recent Labs  Lab 02/17/20 0448 02/17/20 0448 02/18/20 0314 02/18/20 0314 02/19/20 0449 02/20/20 0500 02/20/20 1810 02/21/20 0500 02/22/20 0601  NA 137  --  143  --   --  144  --  144 144  K 3.6   < > 3.6  --   --  2.5* 2.8* 2.5* 3.7  CL 110  --  114*  --   --  108  --  108 111  CO2 13*  --  21*  --   --  27  --  28 26  GLUCOSE 120*  --  108*  --   --  93  --  101* 97  BUN <5*  --  <5*  --   --  <5*  --  <5* 5*  CREATININE 0.72  --  0.60  --   --  0.62  --  0.57 0.63  CALCIUM 8.3*  --  8.5*  --   --  8.4*  --  8.6* 8.4*  MG 1.8  --  1.8  --   --   --  1.7 1.9 2.2  PHOS 2.8   < > 2.2*   < > 1.2* 2.2* 3.5 3.0 2.8   < > = values in this interval not displayed.    Liver Function Tests: Recent Labs  Lab 02/16/20 1412 02/20/20 0500 02/21/20 0500 02/22/20 0601  AST 27 35 32 24  ALT 15 26 25 19   ALKPHOS 76 45 47 44  BILITOT 0.9 1.2 1.3* 1.1  PROT 7.6 4.6* 5.3* 4.9*  ALBUMIN 4.6 2.8* 2.9* 2.8*   No results for input(s): LIPASE, AMYLASE in the last 168 hours. No results for input(s): AMMONIA in the last 168 hours. CBC: Recent Labs  Lab 02/16/20 1412 02/17/20 0426 02/17/20 0448 02/18/20 0314 02/20/20 0500 02/21/20 0500 02/22/20 0601  WBC 15.1*  --  13.9* 15.1* 4.4 5.6 5.3  NEUTROABS 10.7*  --   --   --   --  4.2 3.6  HGB 14.1   < > 12.6 12.3 10.1* 10.4* 10.2*  HCT 44.3   < > 39.9 38.2 30.7* 31.3* 30.9*  MCV 87.4  --  89.1 87.4 84.6 84.6 86.8  PLT 296  --  205 229 113* 152 136*   < > = values in this interval not displayed.   Cardiac Enzymes: Recent Labs  Lab 02/16/20 1409  CKTOTAL 40   BNP: BNP (last 3 results) No results for input(s): BNP in the last 8760 hours.  ProBNP (last 3 results) No results for input(s): PROBNP in the last 8760 hours.  CBG: Recent Labs  Lab 02/21/20 1557 02/21/20 2022 02/21/20 2335 02/22/20 0401 02/22/20 0732  GLUCAP 109* 135* 87 97 92       Signed:  Dia Crawford, MD [facility] [phone] pager

## 2020-02-22 NOTE — Progress Notes (Signed)
Physical Therapy Treatment Patient Details Name: Mallory Peters MRN: [id number] DOB: 1941/02/23 Today's Date: 02/22/2020    History of Present Illness 79 yo female found by son at home.  Reported have 20 minutes tonic/clonic seizure after [facility] arrived.  PMH includes CVA. Osteoarthritis, Meningioma, HLD, HTN, Insomnia, GERD, Esophageal dysmotility, Depression, Dementia, CAD, Colon polyps, B12 deficiency, Anxiety.  Intubated for airway protection. Pt also with signs of septic shock.    PT Comments    Pt admitted with above diagnosis. Pt is able to ambulate with RW with min assist and mod cues.  Pt needs cues for safety and min assist to steer RW and for steadying at times.  Will need 24 hour care at home.   Pt currently with functional limitations due to balance and endurance deficits. VSS.  Pt will benefit from skilled PT to increase their independence and safety with mobility to allow discharge to the venue listed below.     Follow Up Recommendations  Supervision/Assistance - 24 hour;Home health PT     Equipment Recommendations  None recommended by PT(has RW at home)    Recommendations for Other Services       Precautions / Restrictions Precautions Precautions: Fall Precaution Comments: seizure Restrictions Weight Bearing Restrictions: No    Mobility  Bed Mobility Overal bed mobility: Needs Assistance Bed Mobility: Supine to Sit     Supine to sit: Min assist Sit to supine: Min assist   General bed mobility comments: pt with LEs towards EOB trying to get OOB unassisted upon arrival. Asking to use bathroom.    Transfers Overall transfer level: Needs assistance Equipment used: 2 person hand held assist Transfers: Sit to/from Stand Sit to Stand: Min assist;Mod assist         General transfer comment: mod A +1 for initial stand from EOB, min A from BSC, posterior lean and flexed posture, poor safety awareness. Needs bil UE support.    Ambulation/Gait Ambulation/Gait assistance: Mod assist;Min assist Gait Distance (Feet): 110 Feet Assistive device: Rolling walker (2 wheeled);2 person hand held assist Gait Pattern/deviations: Shuffle;Step-to pattern;Decreased step length - right;Decreased step length - left;Antalgic;Leaning posteriorly;Trunk flexed;Drifts right/left   Gait velocity interpretation: <1.8 ft/sec, indicate of risk for recurrent falls General Gait Details: intiially pt with HHA of 2 due to room is so small.  Tried RW once pt out in open area and pt was min physical assit and  mod cues.     Stairs             Wheelchair Mobility    Modified Rankin (Stroke Patients Only)       Balance Overall balance assessment: Needs assistance Sitting-balance support: Bilateral upper extremity supported;Feet unsupported Sitting balance-Leahy Scale: Poor Sitting balance - Comments: minA for static sitting at edge of bed Postural control: Posterior lean Standing balance support: Bilateral upper extremity supported Standing balance-Leahy Scale: Poor Standing balance comment: mod-maxA to maintain static standing with UE support                            Cognition Arousal/Alertness: Awake/alert Behavior During Therapy: Restless Overall Cognitive Status: History of cognitive impairments - at baseline                                 General Comments: Oriented to self only.      Exercises      General Comments General  comments (skin integrity, edema, etc.): VSS      Pertinent Vitals/Pain Pain Assessment: No/denies pain    Home Living                      Prior Function            PT Goals (current goals can now be found in the care plan section) Acute Rehab PT Goals Patient Stated Goal: To improve mobility Progress towards PT goals: Progressing toward goals    Frequency    Min 3X/week      PT Plan Current plan remains appropriate    Co-evaluation  PT/OT/SLP Co-Evaluation/Treatment: Yes Reason for Co-Treatment: Complexity of the patient's impairments (multi-system involvement);For patient/therapist safety PT goals addressed during session: Mobility/safety with mobility        AM-PAC PT "6 Clicks" Mobility   Outcome Measure  Help needed turning from your back to your side while in a flat bed without using bedrails?: A Lot Help needed moving from lying on your back to sitting on the side of a flat bed without using bedrails?: A Lot Help needed moving to and from a bed to a chair (including a wheelchair)?: A Lot Help needed standing up from a chair using your arms (e.g., wheelchair or bedside chair)?: A Lot Help needed to walk in hospital room?: A Lot Help needed climbing 3-5 steps with a railing? : Total 6 Click Score: 11    End of Session Equipment Utilized During Treatment: Gait belt Activity Tolerance: Patient tolerated treatment well Patient left: with call bell/phone within reach;in bed;with bed alarm set Nurse Communication: Mobility status PT Visit Diagnosis: Muscle weakness (generalized) (M62.81);Other symptoms and signs involving the nervous system (R29.898)     Time: [id number] PT Time Calculation (min) (ACUTE ONLY): 26 min  Charges:  $Gait Training: 8-22 mins $Therapeutic Activity: 8-22 mins                     Keisuke Hollabaugh W,PT Acute Rehabilitation Services Pager:  [phone]  Office:  [place] 02/22/2020, 12:53 PM

## 2020-02-22 NOTE — Progress Notes (Signed)
Occupational Therapy Treatment Patient Details Name: Mallory Peters MRN: [id number] DOB: [birth year] Today's Date: 02/22/2020    History of present illness 79 yo female found by son at home.  Reported have 20 minutes tonic/clonic seizure after [facility] arrived.  PMH includes CVA. Osteoarthritis, Meningioma, HLD, HTN, Insomnia, GERD, Esophageal dysmotility, Depression, Dementia, CAD, Colon polyps, B12 deficiency, Anxiety.  Intubated for airway protection. Pt also with signs of septic shock.   OT comments  Pt progressing with OOB ADL. Pt performing bed mobility with minA and transfers with minA overall. Pt currently minA for pericare in standing. Pt oriented to self only. Unable to follow all commands without multimodal cues. Pt would greatly benefit from continued OT skilled services for ADL, mobility and safety in [facility] setting with 24/7. OT following acutely.     Follow Up Recommendations  Home health OT;Supervision/Assistance - 24 hour    Equipment Recommendations  3 in 1 bedside commode    Recommendations for Other Services      Precautions / Restrictions Precautions Precautions: Fall Precaution Comments: seizure Restrictions Weight Bearing Restrictions: No       Mobility Bed Mobility Overal bed mobility: Needs Assistance Bed Mobility: Supine to Sit     Supine to sit: Min assist Sit to supine: Min assist   General bed mobility comments: pt with LEs towards EOB trying to get OOB unassisted upon arrival. Asking to use bathroom.    Transfers Overall transfer level: Needs assistance Equipment used: 2 person hand held assist Transfers: Sit to/from Stand Sit to Stand: Min assist;Mod assist         General transfer comment: mod A +1 for initial stand from EOB, min A from BSC, posterior lean and flexed posture, poor safety awareness. Needs bil UE support.     Balance Overall balance assessment: Needs assistance Sitting-balance support: Bilateral upper extremity  supported;Feet unsupported Sitting balance-Leahy Scale: Poor Sitting balance - Comments: minA for static sitting at edge of bed Postural control: Posterior lean Standing balance support: Bilateral upper extremity supported Standing balance-Leahy Scale: Poor Standing balance comment: modA overall for dynamic standing for pericare                           ADL either performed or assessed with clinical judgement   ADL Overall ADL's : Needs assistance/impaired     Grooming: Set up;Sitting Grooming Details (indicate cue type and reason): multimodal cues                 Toilet Transfer: Minimal assistance;Stand-pivot;BSC   Toileting- Clothing Manipulation and Hygiene: Minimal assistance;+2 for physical assistance;Sitting/lateral lean;Sit to/from stand;Cueing for safety Toileting - Clothing Manipulation Details (indicate cue type and reason): +2 for stability and for 1 to assist with wiping     Functional mobility during ADLs: Min guard;Cueing for safety;Rolling walker General ADL Comments: Pt performing grooming with EOB and pericare with minA in standing     Vision       Perception     Praxis      Cognition Arousal/Alertness: Awake/alert Behavior During Therapy: Restless Overall Cognitive Status: History of cognitive impairments - at baseline                                 General Comments: Oriented to self only. Unable to follow all commands without multimodal cues.        Exercises  Shoulder Instructions       General Comments VSS    Pertinent Vitals/ Pain       Pain Assessment: No/denies pain  Home Living                                          Prior Functioning/Environment              Frequency  Min 2X/week        Progress Toward Goals  OT Goals(current goals can now be found in the care plan section)  Progress towards OT goals: Progressing toward goals  Acute Rehab OT Goals Patient  Stated Goal: To improve mobility OT Goal Formulation: With patient/family Time For Goal Achievement: 03/04/20 Potential to Achieve Goals: Good ADL Goals Pt Will Perform Grooming: with min assist;with min guard assist;standing;with caregiver independent in assisting Pt Will Perform Upper Body Bathing: with supervision;with set-up;sitting;with caregiver independent in assisting Pt Will Perform Lower Body Bathing: with mod assist;sitting/lateral leans;sit to/from stand;with caregiver independent in assisting Pt Will Perform Upper Body Dressing: with supervision;with set-up;sitting;with caregiver independent in assisting Pt Will Perform Lower Body Dressing: with max assist;with mod assist;sitting/lateral leans;sit to/from stand;with caregiver independent in assisting Pt Will Transfer to Toilet: with min assist;stand pivot transfer;ambulating;bedside commode;grab bars Pt Will Perform Toileting - Clothing Manipulation and hygiene: with min assist;sit to/from stand  Plan Discharge plan remains appropriate    Co-evaluation    PT/OT/SLP Co-Evaluation/Treatment: Yes Reason for Co-Treatment: Complexity of the patient's impairments (multi-system involvement);To address functional/ADL transfers   OT goals addressed during session: ADL's and self-care      AM-PAC OT "6 Clicks" Daily Activity     Outcome Measure   Help from another person eating meals?: A Little Help from another person taking care of personal grooming?: A Little Help from another person toileting, which includes using toliet, bedpan, or urinal?: None Help from another person bathing (including washing, rinsing, drying)?: A Little Help from another person to put on and taking off regular upper body clothing?: A Little Help from another person to put on and taking off regular lower body clothing?: A Little 6 Click Score: 19    End of Session Equipment Utilized During Treatment: Gait belt  OT Visit Diagnosis: Unsteadiness on  feet (R26.81);History of falling (Z91.81);Muscle weakness (generalized) (M62.81)   Activity Tolerance Patient tolerated treatment well   Patient Left in bed;with call bell/phone within reach;with bed alarm set   Nurse Communication Mobility status        Time: [id number] OT Time Calculation (min): 26 min  Charges: OT General Charges $OT Visit: 1 Visit OT Treatments $Self Care/Home Management : 8-22 mins  Jefferey Pica, OTR/L [place] Pager: [phone] Office: [phone]    Jayanth Szczesniak C 02/22/2020, 4:06 PM

## 2020-02-22 NOTE — Progress Notes (Signed)
Patient alert and oriented x2 at baseline, VSS denies pain, picc line d/c per order,d/c instruction explain and given to the son, all questions answered. Pt. D/c home per orde

## 2020-02-22 NOTE — Progress Notes (Signed)
Occupational Therapy Treatment Patient Details Name: Mallory Peters MRN: [id number] DOB: [birth year] Today's Date: 02/22/2020    History of present illness 79 yo female found by son at home.  Reported have 20 minutes tonic/clonic seizure after [facility] arrived.  PMH includes CVA. Osteoarthritis, Meningioma, HLD, HTN, Insomnia, GERD, Esophageal dysmotility, Depression, Dementia, CAD, Colon polyps, B12 deficiency, Anxiety.  Intubated for airway protection. Pt also with signs of septic shock.   OT comments  Pt found at EOB trying to use her phone. OTR alerted as pt is a high fall risk. Pt returned to bed with standing tasks and shifting toward [facility] with minA overall. Pt returned to supine and RN came in to tell pt that her son would be here soon. Pt continues to benefit from continued OT  Skilled services. OT following acutely.    Follow Up Recommendations  Home health OT;Supervision/Assistance - 24 hour    Equipment Recommendations  3 in 1 bedside commode    Recommendations for Other Services      Precautions / Restrictions Precautions Precautions: Fall Precaution Comments: seizure Restrictions Weight Bearing Restrictions: No       Mobility Bed Mobility Overal bed mobility: Needs Assistance Bed Mobility: Supine to Sit     Supine to sit: Min assist Sit to supine: Min assist   General bed mobility comments: minA for maneuvering BLEs onto EOB.  Transfers Overall transfer level: Needs assistance Equipment used: 1 person hand held assist Transfers: Sit to/from Stand Sit to Stand: Min assist         General transfer comment: minA to stand to make steps toward [facility]    Balance Overall balance assessment: Needs assistance Sitting-balance support: Bilateral upper extremity supported;Feet unsupported Sitting balance-Leahy Scale: Poor Sitting balance - Comments: minA for static sitting at edge of bed Postural control: Posterior lean Standing balance support: Bilateral upper  extremity supported Standing balance-Leahy Scale: Poor Standing balance comment: modA overall for dynamic standing for pericare                           ADL either performed or assessed with clinical judgement   ADL Overall ADL's : Needs assistance/impaired     Grooming: Set up;Sitting Grooming Details (indicate cue type and reason): multimodal cues                 Toilet Transfer: Minimal assistance;Stand-pivot;BSC   Toileting- Clothing Manipulation and Hygiene: Minimal assistance;+2 for physical assistance;Sitting/lateral lean;Sit to/from stand;Cueing for safety Toileting - Clothing Manipulation Details (indicate cue type and reason): +2 for stability and for 1 to assist with wiping     Functional mobility during ADLs: Min guard;Cueing for safety;Rolling walker General ADL Comments: Pt wanting to call her son to come pick her up, but RN came in room alerting pt that he was coming in the next half hour. Pt was at EOB and bed alarm was disengaged, Pt returned to bed to turn bed alarm back on with rails up and fall risk mats in place.     Vision       Perception     Praxis      Cognition Arousal/Alertness: Awake/alert Behavior During Therapy: Restless Overall Cognitive Status: History of cognitive impairments - at baseline                                 General Comments: Oriented to self only. Unable  to follow all commands without multimodal cues.        Exercises     Shoulder Instructions       General Comments VSS    Pertinent Vitals/ Pain       Pain Assessment: No/denies pain  Home Living                                          Prior Functioning/Environment              Frequency  Min 2X/week        Progress Toward Goals  OT Goals(current goals can now be found in the care plan section)  Progress towards OT goals: Progressing toward goals  Acute Rehab OT Goals Patient Stated Goal: To  improve mobility OT Goal Formulation: With patient/family Time For Goal Achievement: 03/04/20 Potential to Achieve Goals: Good ADL Goals Pt Will Perform Grooming: with min assist;with min guard assist;standing;with caregiver independent in assisting Pt Will Perform Upper Body Bathing: with supervision;with set-up;sitting;with caregiver independent in assisting Pt Will Perform Lower Body Bathing: with mod assist;sitting/lateral leans;sit to/from stand;with caregiver independent in assisting Pt Will Perform Upper Body Dressing: with supervision;with set-up;sitting;with caregiver independent in assisting Pt Will Perform Lower Body Dressing: with max assist;with mod assist;sitting/lateral leans;sit to/from stand;with caregiver independent in assisting Pt Will Transfer to Toilet: with min assist;stand pivot transfer;ambulating;bedside commode;grab bars Pt Will Perform Toileting - Clothing Manipulation and hygiene: with min assist;sit to/from stand  Plan Discharge plan remains appropriate    Co-evaluation    PT/OT/SLP Co-Evaluation/Treatment: Yes Reason for Co-Treatment: Complexity of the patient's impairments (multi-system involvement);To address functional/ADL transfers   OT goals addressed during session: ADL's and self-care      AM-PAC OT "6 Clicks" Daily Activity     Outcome Measure   Help from another person eating meals?: A Little Help from another person taking care of personal grooming?: A Little Help from another person toileting, which includes using toliet, bedpan, or urinal?: None Help from another person bathing (including washing, rinsing, drying)?: A Little Help from another person to put on and taking off regular upper body clothing?: A Little Help from another person to put on and taking off regular lower body clothing?: A Little 6 Click Score: 19    End of Session Equipment Utilized During Treatment: Gait belt  OT Visit Diagnosis: Unsteadiness on feet  (R26.81);History of falling (Z91.81);Muscle weakness (generalized) (M62.81)   Activity Tolerance Patient tolerated treatment well   Patient Left in bed;with call bell/phone within reach;with bed alarm set   Nurse Communication Mobility status        Time: [id number] OT Time Calculation (min): 10 min  Charges: OT General Charges $OT Visit: 1 Visit OT Treatments  $Therapeutic Activity: 8-22 mins  Jefferey Pica, OTR/L [place] Pager: [phone] Office: [phone]   Cecia Egge C 02/22/2020, 4:16 PM

## 2022-04-11 ENCOUNTER — Emergency Department: Payer: Medicare Other

## 2022-04-11 ENCOUNTER — Emergency Department
Admission: EM | Admit: 2022-04-11 | Discharge: 2022-04-11 | Disposition: A | Discharge status: Home / Self Care | Payer: Medicare Other | Attending: Emergency Medicine | Admitting: Emergency Medicine

## 2022-04-11 ENCOUNTER — Other Ambulatory Visit: Payer: Self-pay

## 2022-04-11 DIAGNOSIS — R569 Unspecified convulsions: Secondary | ICD-10-CM | POA: Diagnosis present

## 2022-04-11 DIAGNOSIS — R4189 Other symptoms and signs involving cognitive functions and awareness: Secondary | ICD-10-CM | POA: Diagnosis not present

## 2022-04-11 LAB — CBC WITH DIFFERENTIAL/PLATELET
Abs Immature Granulocytes: 0.02 10*3/uL (ref 0.00–0.07)
Basophils Absolute: 0.1 10*3/uL (ref 0.0–0.1)
Basophils Relative: 1 %
Eosinophils Absolute: 0.1 10*3/uL (ref 0.0–0.5)
Eosinophils Relative: 1 %
HCT: 44 % (ref 36.0–46.0)
Hemoglobin: 14.2 g/dL (ref 12.0–15.0)
Immature Granulocytes: 0 %
Lymphocytes Relative: 19 %
Lymphs Abs: 1.1 10*3/uL (ref 0.7–4.0)
MCH: 28.3 pg (ref 26.0–34.0)
MCHC: 32.3 g/dL (ref 30.0–36.0)
MCV: 87.6 fL (ref 80.0–100.0)
Monocytes Absolute: 0.3 10*3/uL (ref 0.1–1.0)
Monocytes Relative: 6 %
Neutro Abs: 4.2 10*3/uL (ref 1.7–7.7)
Neutrophils Relative %: 73 %
Platelets: 185 10*3/uL (ref 150–400)
RBC: 5.02 MIL/uL (ref 3.87–5.11)
RDW: 12.5 % (ref 11.5–15.5)
WBC: 5.8 10*3/uL (ref 4.0–10.5)
nRBC: 0 % (ref 0.0–0.2)

## 2022-04-11 LAB — URINALYSIS, ROUTINE W REFLEX MICROSCOPIC
Bacteria, UA: NONE SEEN
Bilirubin Urine: NEGATIVE
Glucose, UA: NEGATIVE mg/dL
Ketones, ur: NEGATIVE mg/dL
Leukocytes,Ua: NEGATIVE
Nitrite: NEGATIVE
Protein, ur: NEGATIVE mg/dL
Specific Gravity, Urine: 1.014 (ref 1.005–1.030)
pH: 5 (ref 5.0–8.0)

## 2022-04-11 LAB — COMPREHENSIVE METABOLIC PANEL
ALT: 10 U/L (ref 0–44)
AST: 13 U/L — ABNORMAL LOW (ref 15–41)
Albumin: 3.9 g/dL (ref 3.5–5.0)
Alkaline Phosphatase: 55 U/L (ref 38–126)
Anion gap: 6 (ref 5–15)
BUN: 16 mg/dL (ref 8–23)
CO2: 24 mmol/L (ref 22–32)
Calcium: 9.2 mg/dL (ref 8.9–10.3)
Chloride: 107 mmol/L (ref 98–111)
Creatinine, Ser: 0.84 mg/dL (ref 0.44–1.00)
GFR, Estimated: >60 mL/min (ref >60)
Glucose, Bld: 101 mg/dL — ABNORMAL HIGH (ref 70–99)
Potassium: 4 mmol/L (ref 3.5–5.1)
Sodium: 137 mmol/L (ref 135–145)
Total Bilirubin: 0.5 mg/dL (ref 0.3–1.2)
Total Protein: 6.8 g/dL (ref 6.5–8.1)

## 2022-04-11 LAB — TROPONIN I (HIGH SENSITIVITY): Troponin I (High Sensitivity): 3 ng/L (ref <18)

## 2022-04-11 NOTE — ED Notes (Signed)
Pt responded to sternal rub.

## 2022-04-11 NOTE — ED Triage Notes (Signed)
Pt via POV from home. Pt states that daughter states that today, pt states out of nowhere she had a sudden onset of unresponsiveness. Daughter states right before she told daughter she didn't feel good and felt dizzy. During triage, pt is minimally responsive to pain. Pt has a hx of seizures, and takes Keppra states last time this happened was in 2021.   Pt room to 2, Elsie Amis, primary RN notified.

## 2022-04-11 NOTE — ED Notes (Signed)
Per EDP No need for repeat Trop.

## 2022-04-11 NOTE — ED Provider Notes (Signed)
[facility] Provider Note    Event Date/Time   First MD Initiated Contact with Patient 04/11/22 1641     (approximate)   History   Seizures   HPI  Mallory Peters is a 81 y.o. female who presents to the ED for evaluation of poor responsiveness.  I reviewed outpatient EEG from March 2022.  Performed due to a similar episode as today.  EEG noted to be normal. I review associated neurology clinic visit in January 2022.  History of memory loss, anxiety, seizure disorder on Keppra. On DAPT with Plavix due to cerebrovascular disease.  Daughter brings patient to the ED for evaluation of an episode of poor responsiveness that occurred earlier this afternoon just prior to arrival.  Daughter initially provides all history.  She reports that she was working outside and patient was inside having a normal day.  Daughter was called into the house because patient reported feeling dizzy and weak all over.  Daughter led her to a nearby counter she laid down.  Daughter turned around for minute when she came back patient was poorly responsive in the couch.  No falls or discrete syncopal episodes, no witnessed seizure activity.  Just poor responsiveness.  Daughter reports that she was able to get her up by "being mean to her."  She was able to get her to walk to the car to bring her here to the ED.  On my initial evaluation, she is poorly responsive and refuses to answer my questions.  The daughter tries to help and when she asks the patient if she wants to go home, she opens her eyes and says she wants to go home, looks at me and then closes her eyes again.  Physical Exam   Triage Vital Signs: ED Triage Vitals  Enc Vitals Group     BP 04/11/22 1628 123/72     Pulse Rate 04/11/22 1628 72     Resp 04/11/22 1628 20     Temp 04/11/22 1628 98 F (36.7 C)     Temp Source 04/11/22 1628 Oral     SpO2 04/11/22 1628 98 %     Weight 04/11/22 1629 127 lb 13.9 oz (58 kg)      Height 04/11/22 1629 '5\' 2"'$  (1.575 m)     Head Circumference --      Peak Flow --      Pain Score 04/11/22 1629 0     Pain Loc --      Pain Edu? --      Excl. in [place]? --     Most recent vital signs: Vitals:   04/11/22 1730 04/11/22 1800  BP: 119/66 116/62  Pulse: 70 76  Resp: 15 19  Temp:    SpO2: 98% 95%    General: On my first evaluation, she is laying still, keeping her eyes closed, breathing on her own with intact airway.  When I pry open her eyes, she clenches them shot after making eye contact with me.  Pupils PERRL midline.  On later evaluations, she is sitting up in bed and talking with her daughter without complaints. CV:  Good peripheral perfusion.  RRR without appreciable murmur Resp:  Normal effort.  Abd:  No distention.  Soft and benign MSK:  No deformity noted.  No signs of trauma Neuro:  No focal deficits appreciated. Cranial nerves II through XII intact 5/5 strength and sensation in all 4 extremities No clonus, rigidity Other:     ED Results /  Procedures / Treatments   Labs (all labs ordered are listed, but only abnormal results are displayed) Labs Reviewed  COMPREHENSIVE METABOLIC PANEL - Abnormal; Notable for the following components:      Result Value   Glucose, Bld 101 (*)    AST 13 (*)    All other components within normal limits  URINALYSIS, ROUTINE W REFLEX MICROSCOPIC - Abnormal; Notable for the following components:   Color, Urine YELLOW (*)    APPearance CLEAR (*)    Hgb urine dipstick SMALL (*)    All other components within normal limits  CBC WITH DIFFERENTIAL/PLATELET  TROPONIN I (HIGH SENSITIVITY)  TROPONIN I (HIGH SENSITIVITY)    EKG Sinus rhythm with a rate of 73 bpm.  Normal axis and intervals.  No evidence of acute ischemia.  RADIOLOGY CT head interpreted by me without evidence of acute intracranial pathology  Official radiology report(s): CT HEAD WO CONTRAST (5MM)  Result Date: 04/11/2022 CLINICAL DATA:  Altered mental  status. EXAM: CT HEAD WITHOUT CONTRAST TECHNIQUE: Contiguous axial images were obtained from the base of the skull through the vertex without intravenous contrast. RADIATION DOSE REDUCTION: This exam was performed according to the departmental dose-optimization program which includes automated exposure control, adjustment of the mA and/or kV according to patient size and/or use of iterative reconstruction technique. COMPARISON:  MRI brain dated February 17, 2020. CT head dated February 16, 2020. FINDINGS: Brain: No evidence of acute infarction, hemorrhage, hydrocephalus, extra-axial collection or mass effect. Unchanged 1.2 cm calcified meningioma overlying the left frontal lobe convexity. Stable moderate atrophy and chronic microvascular ischemic changes. Vascular: Calcified atherosclerosis at the skull base. No hyperdense vessel. Skull: Normal. Negative for fracture or focal lesion. Sinuses/Orbits: No acute finding. Other: None. IMPRESSION: 1. No acute intracranial abnormality. 2. Stable moderate atrophy and chronic microvascular ischemic changes. 3. Unchanged 1.2 cm calcified meningioma overlying the left frontal lobe convexity. Electronically Signed   By: Titus Dubin M.D.   On: 04/11/2022 17:57    PROCEDURES and INTERVENTIONS:  .1-3 Lead EKG Interpretation  Performed by: Vladimir Crofts, MD Authorized by: Vladimir Crofts, MD     Interpretation: normal     ECG rate:  70   ECG rate assessment: normal     Rhythm: sinus rhythm     Ectopy: none     Conduction: normal     Medications - No data to display   IMPRESSION / MDM / [place] / ED COURSE  I reviewed the triage vital signs and the nursing notes.  Differential diagnosis includes, but is not limited to, stroke, seizure, conversion disorder  {Patient presents with symptoms of an acute illness or injury that is potentially life-threatening.  Patient presents to the ED with an episode of poor responsiveness that resolved independently and  suitable for close outpatient management.  She has normal vital signs and looks systemically well.  She initially refuses to participate, though I doubt active seizure or status epilepticus.  Blood work is reassuring with normal CBC, metabolic panel, negative troponin and clean urine.  CT head without evidence of ICH and no signs of cardiac dysrhythmia.  No witnessed seizure activity.  Possibly an absence seizure, but seems less likely.  We discussed adherence to her antiepileptic and following up with her neurologist.  Return precautions for the ED discussed.      FINAL CLINICAL IMPRESSION(S) / ED DIAGNOSES   Final diagnoses:  Decreased responsiveness     Rx / DC Orders   ED Discharge Orders  None        Note:  This document was prepared using Dragon voice recognition software and may include unintentional dictation errors.   Vladimir Crofts, MD 04/11/22 [phone]

## 2022-04-11 NOTE — ED Notes (Signed)
Per daughter, Pt was c/o of her head spinning. Daughter states that pt became unresponsive. Pt is currently minimally responsive.

## 2022-04-11 NOTE — ED Notes (Signed)
Pt is now alert asking for water.

## 2022-08-25 ENCOUNTER — Emergency Department: Payer: Medicare Other

## 2022-08-25 ENCOUNTER — Inpatient Hospital Stay
Admission: EM | Admit: 2022-08-25 | Discharge: 2022-08-31 | DRG: 522 | Disposition: A | Discharge status: Skilled Nursing Facility | Payer: Medicare Other | Attending: Hospitalist | Admitting: Hospitalist

## 2022-08-25 ENCOUNTER — Other Ambulatory Visit: Payer: Self-pay

## 2022-08-25 ENCOUNTER — Inpatient Hospital Stay: Payer: Medicare Other

## 2022-08-25 DIAGNOSIS — S72002A Fracture of unspecified part of neck of left femur, initial encounter for closed fracture: Secondary | ICD-10-CM | POA: Diagnosis present

## 2022-08-25 DIAGNOSIS — Z8719 Personal history of other diseases of the digestive system: Secondary | ICD-10-CM

## 2022-08-25 DIAGNOSIS — Z881 Allergy status to other antibiotic agents status: Secondary | ICD-10-CM | POA: Diagnosis not present

## 2022-08-25 DIAGNOSIS — Z79899 Other long term (current) drug therapy: Secondary | ICD-10-CM

## 2022-08-25 DIAGNOSIS — K219 Gastro-esophageal reflux disease without esophagitis: Secondary | ICD-10-CM | POA: Diagnosis present

## 2022-08-25 DIAGNOSIS — Z1152 Encounter for screening for COVID-19: Secondary | ICD-10-CM | POA: Diagnosis not present

## 2022-08-25 DIAGNOSIS — F0283 Dementia in other diseases classified elsewhere, unspecified severity, with mood disturbance: Secondary | ICD-10-CM | POA: Diagnosis present

## 2022-08-25 DIAGNOSIS — S72001A Fracture of unspecified part of neck of right femur, initial encounter for closed fracture: Principal | ICD-10-CM | POA: Diagnosis present

## 2022-08-25 DIAGNOSIS — D329 Benign neoplasm of meninges, unspecified: Secondary | ICD-10-CM | POA: Diagnosis present

## 2022-08-25 DIAGNOSIS — J449 Chronic obstructive pulmonary disease, unspecified: Secondary | ICD-10-CM | POA: Diagnosis present

## 2022-08-25 DIAGNOSIS — Z7902 Long term (current) use of antithrombotics/antiplatelets: Secondary | ICD-10-CM

## 2022-08-25 DIAGNOSIS — Z8249 Family history of ischemic heart disease and other diseases of the circulatory system: Secondary | ICD-10-CM | POA: Diagnosis not present

## 2022-08-25 DIAGNOSIS — E785 Hyperlipidemia, unspecified: Secondary | ICD-10-CM | POA: Diagnosis present

## 2022-08-25 DIAGNOSIS — Y92009 Unspecified place in unspecified non-institutional (private) residence as the place of occurrence of the external cause: Secondary | ICD-10-CM

## 2022-08-25 DIAGNOSIS — I5032 Chronic diastolic (congestive) heart failure: Secondary | ICD-10-CM | POA: Diagnosis present

## 2022-08-25 DIAGNOSIS — Z86011 Personal history of benign neoplasm of the brain: Secondary | ICD-10-CM

## 2022-08-25 DIAGNOSIS — I2510 Atherosclerotic heart disease of native coronary artery without angina pectoris: Secondary | ICD-10-CM | POA: Diagnosis present

## 2022-08-25 DIAGNOSIS — Z8673 Personal history of transient ischemic attack (TIA), and cerebral infarction without residual deficits: Secondary | ICD-10-CM

## 2022-08-25 DIAGNOSIS — D62 Acute posthemorrhagic anemia: Secondary | ICD-10-CM | POA: Diagnosis not present

## 2022-08-25 DIAGNOSIS — W1830XA Fall on same level, unspecified, initial encounter: Secondary | ICD-10-CM | POA: Diagnosis present

## 2022-08-25 DIAGNOSIS — I110 Hypertensive heart disease with heart failure: Secondary | ICD-10-CM | POA: Diagnosis present

## 2022-08-25 DIAGNOSIS — Z808 Family history of malignant neoplasm of other organs or systems: Secondary | ICD-10-CM

## 2022-08-25 DIAGNOSIS — F0280 Dementia in other diseases classified elsewhere without behavioral disturbance: Secondary | ICD-10-CM | POA: Diagnosis present

## 2022-08-25 DIAGNOSIS — F0284 Dementia in other diseases classified elsewhere, unspecified severity, with anxiety: Secondary | ICD-10-CM | POA: Diagnosis present

## 2022-08-25 DIAGNOSIS — Z9049 Acquired absence of other specified parts of digestive tract: Secondary | ICD-10-CM | POA: Diagnosis not present

## 2022-08-25 DIAGNOSIS — R569 Unspecified convulsions: Secondary | ICD-10-CM | POA: Diagnosis present

## 2022-08-25 DIAGNOSIS — K5730 Diverticulosis of large intestine without perforation or abscess without bleeding: Secondary | ICD-10-CM | POA: Diagnosis present

## 2022-08-25 DIAGNOSIS — G309 Alzheimer's disease, unspecified: Secondary | ICD-10-CM | POA: Diagnosis present

## 2022-08-25 DIAGNOSIS — F32A Depression, unspecified: Secondary | ICD-10-CM | POA: Diagnosis present

## 2022-08-25 DIAGNOSIS — Z66 Do not resuscitate: Secondary | ICD-10-CM | POA: Diagnosis present

## 2022-08-25 DIAGNOSIS — Z888 Allergy status to other drugs, medicaments and biological substances status: Secondary | ICD-10-CM | POA: Diagnosis not present

## 2022-08-25 DIAGNOSIS — E876 Hypokalemia: Secondary | ICD-10-CM | POA: Diagnosis present

## 2022-08-25 DIAGNOSIS — Z800 Family history of malignant neoplasm of digestive organs: Secondary | ICD-10-CM

## 2022-08-25 DIAGNOSIS — W19XXXA Unspecified fall, initial encounter: Secondary | ICD-10-CM

## 2022-08-25 LAB — HEPATIC FUNCTION PANEL
ALT: 15 U/L (ref 0–44)
AST: 20 U/L (ref 15–41)
Albumin: 3.8 g/dL (ref 3.5–5.0)
Alkaline Phosphatase: 64 U/L (ref 38–126)
Bilirubin, Direct: 0.1 mg/dL (ref 0.0–0.2)
Indirect Bilirubin: 0.7 mg/dL (ref 0.3–0.9)
Total Bilirubin: 0.8 mg/dL (ref 0.3–1.2)
Total Protein: 6.3 g/dL — ABNORMAL LOW (ref 6.5–8.1)

## 2022-08-25 LAB — BASIC METABOLIC PANEL
Anion gap: 6 (ref 5–15)
BUN: 14 mg/dL (ref 8–23)
CO2: 25 mmol/L (ref 22–32)
Calcium: 9.3 mg/dL (ref 8.9–10.3)
Chloride: 108 mmol/L (ref 98–111)
Creatinine, Ser: 0.8 mg/dL (ref 0.44–1.00)
GFR, Estimated: >60 mL/min (ref >60)
Glucose, Bld: 91 mg/dL (ref 70–99)
Potassium: 3.7 mmol/L (ref 3.5–5.1)
Sodium: 139 mmol/L (ref 135–145)

## 2022-08-25 LAB — CBC
HCT: 41.9 % (ref 36.0–46.0)
Hemoglobin: 13.6 g/dL (ref 12.0–15.0)
MCH: 28 pg (ref 26.0–34.0)
MCHC: 32.5 g/dL (ref 30.0–36.0)
MCV: 86.2 fL (ref 80.0–100.0)
Platelets: 164 10*3/uL (ref 150–400)
RBC: 4.86 MIL/uL (ref 3.87–5.11)
RDW: 12.5 % (ref 11.5–15.5)
WBC: 5.4 10*3/uL (ref 4.0–10.5)
nRBC: 0 % (ref 0.0–0.2)

## 2022-08-25 LAB — TYPE AND SCREEN
ABO/RH(D): A POS
Antibody Screen: NEGATIVE

## 2022-08-25 LAB — RESP PANEL BY RT-PCR (FLU A&B, COVID) ARPGX2
Influenza A by PCR: NEGATIVE
Influenza B by PCR: NEGATIVE
SARS Coronavirus 2 by RT PCR: NEGATIVE

## 2022-08-25 LAB — PROTIME-INR
INR: 1.1 (ref 0.8–1.2)
Prothrombin Time: 13.7 seconds (ref 11.4–15.2)

## 2022-08-25 MED ORDER — MODAFINIL 100 MG PO TABS
100.0000 mg | ORAL_TABLET | Freq: Every day | ORAL | Status: DC
  Administered 2022-08-27 – 2022-08-31 (×5): 100 mg via ORAL
  Filled 2022-08-25 (×5): qty 1

## 2022-08-25 MED ORDER — RISPERIDONE 0.25 MG PO TABS: 0.2500 mg | ORAL_TABLET | Freq: Every day | ORAL | Status: DC | PRN

## 2022-08-25 MED ORDER — ROSUVASTATIN CALCIUM 10 MG PO TABS
10.0000 mg | ORAL_TABLET | Freq: Every day | ORAL | Status: DC
  Administered 2022-08-25 – 2022-08-30 (×6): 10 mg via ORAL
  Filled 2022-08-25 (×6): qty 1

## 2022-08-25 MED ORDER — ALPRAZOLAM 0.5 MG PO TABS
0.2500 mg | ORAL_TABLET | Freq: Every evening | ORAL | Status: DC | PRN
Start: 2022-08-25 — End: 2022-08-31
  Administered 2022-08-28: 0.25 mg via ORAL
  Filled 2022-08-25: qty 1

## 2022-08-25 MED ORDER — HYDROCODONE-ACETAMINOPHEN 5-325 MG PO TABS: 1.0000 | ORAL_TABLET | Freq: Four times a day (QID) | ORAL | Status: DC | PRN

## 2022-08-25 MED ORDER — SERTRALINE HCL 50 MG PO TABS
75.0000 mg | ORAL_TABLET | Freq: Every day | ORAL | Status: DC
  Administered 2022-08-25 – 2022-08-30 (×6): 75 mg via ORAL
  Filled 2022-08-25 (×6): qty 2

## 2022-08-25 MED ORDER — MORPHINE SULFATE (PF) 2 MG/ML IV SOLN
1.0000 mg | INTRAVENOUS | Status: DC | PRN
  Administered 2022-08-25: 1 mg via INTRAVENOUS
  Filled 2022-08-25: qty 1

## 2022-08-25 MED ORDER — LORAZEPAM 2 MG/ML IJ SOLN: 1.0000 mg | INTRAMUSCULAR | Status: DC | PRN

## 2022-08-25 MED ORDER — SENNOSIDES-DOCUSATE SODIUM 8.6-50 MG PO TABS: 1.0000 | ORAL_TABLET | Freq: Every evening | ORAL | Status: DC | PRN

## 2022-08-25 MED ORDER — LEVETIRACETAM 100 MG/ML PO SOLN
750.0000 mg | Freq: Two times a day (BID) | ORAL | Status: DC
  Administered 2022-08-25: 750 mg via ORAL
  Filled 2022-08-25 (×2): qty 7.5

## 2022-08-25 MED ORDER — IOHEXOL 300 MG/ML  SOLN
100.0000 mL | Freq: Once | INTRAMUSCULAR | Status: AC | PRN
  Administered 2022-08-25: 100 mL via INTRAVENOUS

## 2022-08-25 MED ORDER — MIDODRINE HCL 5 MG PO TABS
2.5000 mg | ORAL_TABLET | Freq: Two times a day (BID) | ORAL | Status: DC
  Administered 2022-08-25 – 2022-08-31 (×11): 2.5 mg via ORAL
  Filled 2022-08-25 (×11): qty 1

## 2022-08-25 MED ORDER — CEFAZOLIN SODIUM-DEXTROSE 2-4 GM/100ML-% IV SOLN
2.0000 g | INTRAVENOUS | Status: AC
  Administered 2022-08-26: 2 g via INTRAVENOUS
  Filled 2022-08-25: qty 100

## 2022-08-25 MED ORDER — MORPHINE SULFATE (PF) 4 MG/ML IV SOLN
4.0000 mg | Freq: Once | INTRAVENOUS | Status: AC
  Administered 2022-08-25: 4 mg via INTRAVENOUS
  Filled 2022-08-25: qty 1

## 2022-08-25 MED ORDER — DONEPEZIL HCL 5 MG PO TABS
10.0000 mg | ORAL_TABLET | Freq: Every day | ORAL | Status: DC
  Administered 2022-08-25 – 2022-08-30 (×6): 10 mg via ORAL
  Filled 2022-08-25 (×6): qty 2

## 2022-08-25 MED ORDER — HEPARIN SODIUM (PORCINE) 5000 UNIT/ML IJ SOLN
5000.0000 [IU] | Freq: Three times a day (TID) | INTRAMUSCULAR | Status: DC
  Administered 2022-08-25: 5000 [IU] via SUBCUTANEOUS
  Filled 2022-08-25: qty 1

## 2022-08-25 NOTE — Assessment & Plan Note (Signed)
-   Fall precautions - Presumed secondary to mechanical fall however as patient has dementia we will check for UA and COVID/influenza A/influenza B PCR to assess for other etiology of fall - UA, COVID/influenza A/influenza B PCR have been ordered and pending collection - Supportive measures: Hydrocodone-acetaminophen 5-325 mg tablet, 1 to 2 tablets p.o. every 6 hours as needed for moderate pain; morphine 1 mg IV every 3 hours as needed for severe pain, 18 hours ordered - AM team to reevaluate patient at bedside to determine continued opioid pain medication requirements - Orthopedic service has been consulted, Dr. Mack Guise states he will see the patient however given that patient has taken Plavix patient will need approximately 24-hour washout - Admit to MedSurg, inpatient, no telemetry

## 2022-08-25 NOTE — Assessment & Plan Note (Signed)
Continue Crestor and Zetia 

## 2022-08-25 NOTE — Assessment & Plan Note (Signed)
With anxiety - Resumed home sertraline 75 mg daily nightly, alprazolam 0.25 mg p.o. nightly for anxiety

## 2022-08-25 NOTE — Hospital Course (Signed)
[name] is a 82 year old female with hypertension, dementia, anxiety, depression, hyperlipidemia, who presents emergency department for chief concerns of unwitnessed fall and right hip pain.  Initial vitals in the emergency department showed temperature of 97.7, respiration rate of 20, heart rate of 73, blood pressure 140/68, SPO2 of 96% on room air.  Serum sodium is 139, potassium 3.7, chloride 108, bicarb 25, BUN of 14, serum creatinine of 0.80, eGFR greater than 60, nonfasting blood glucose 91, WBC 5.4, hemoglobin 13.6, platelets of 164.  UA has been ordered and pending.  Right hip pelvis x-ray 2-3 view: Was read as mildly displaced right femoral neck fracture.  CT head without contrast and CT cervical spine without contrast: Was read as no skull fracture or intracranial hemorrhage.  No cervical spine fracture or subluxation.  Stable mild diffuse cerebral and cerebellar atrophy and moderate chronic small vessel white matter ischemic changes in both cerebral hemispheres.  Stable densely calcified meningioma in the left lateral frontoparietal region.  CT chest abdomen pelvis with contrast: No acute abnormality in the chest, abdomen or pelvis.  Right femoral neck fracture with mild anterior and proximal displacement of the distal fragment.  Mild changes of COPD.  Mild sigmoid and descending: Diverticulosis.  2.4 cm right ovarian dermoid with little changes since 2014.  ED treatment: Morphine 4 mg IV one-time dose,

## 2022-08-25 NOTE — ED Triage Notes (Signed)
Pt to ED [facility] from home for unwitnessed fall per daughter. Pt disoriented x4, cannot remember name. Has dementia. Pt c/o pain, cannot localize.  Moans with movement to right leg/hip.

## 2022-08-25 NOTE — Consult Note (Signed)
ORTHOPAEDIC CONSULTATION  REQUESTING PHYSICIAN: Cox, Amy N, DO  Chief Complaint: Right hip pain status post fall  HPI: Mallory Peters is a 81 y.o. female with a history of dementia and stroke who complains of right hip pain after a fall at home.  Patient is reported to be able to ambulate independently at baseline.  He has been not been able to weight-bear on the right lower extremity since her fall.  Patient was brought to the [facility] emergency department where she was diagnosed with a right displaced femoral neck hip fracture by x-ray.  She was admitted to the hospital service for preoperative evaluation.  Orthopedics was consulted for management of her right hip fracture.  Patient is seen this evening in her hospital room with her daughter at the bedside.  Past Medical History:  Diagnosis Date   Anginal pain ([place])    Anxiety    B12 deficiency    Colon polyps    CVD (cardiovascular disease)    Dementia ([place])    Depression    Dysphagia    Dyspnea    Esophageal dysmotility    Gastritis    GERD (gastroesophageal reflux disease)    Hemorrhoids    HNP (herniated nucleus pulposus), lumbar    Hyperlipidemia    Hypertension    Insomnia    Lipids blood increased    Meningioma ([facility])    Non-cardiac chest pain    Osteoarthritis    Polyp of cervix    Stroke (cerebrum) ([place])    Past Surgical History:  Procedure Laterality Date   BIOPSY SHOULDER     CHOLECYSTECTOMY     colonic polyps     COLONOSCOPY     COLONOSCOPY WITH PROPOFOL N/A 04/28/2018   Procedure: COLONOSCOPY WITH PROPOFOL;  Surgeon: Manya Silvas, MD;  Location: [facility] ENDOSCOPY;  Service: Endoscopy;  Laterality: N/A;   ESOPHAGOGASTRODUODENOSCOPY (EGD) WITH PROPOFOL N/A 07/12/2016   Procedure: ESOPHAGOGASTRODUODENOSCOPY (EGD) WITH PROPOFOL;  Surgeon: Manya Silvas, MD;  Location: [facility] ENDOSCOPY;  Service: Endoscopy;  Laterality: N/A;   SAVORY DILATION N/A 07/12/2016   Procedure: SAVORY DILATION;  Surgeon:  Manya Silvas, MD;  Location: [facility] [place] ENDOSCOPY;  Service: Endoscopy;  Laterality: N/A;   TUBAL LIGATION     Social History   Socioeconomic History   Marital status: Widowed    Spouse name: Not on file   Number of children: Not on file   Years of education: Not on file   Highest education level: Not on file  Occupational History   Not on file  Tobacco Use   Smoking status: Never   Smokeless tobacco: Never  Substance and Sexual Activity   Alcohol use: No   Drug use: No   Sexual activity: Not Currently  Other Topics Concern   Not on file  Social History Narrative   Not on file   Social Determinants of Health   Financial Resource Strain: Not on file  Food Insecurity: No Food Insecurity (08/25/2022)   Hunger Vital Sign    Worried About Running Out of Food in the Last Year: Never true    Ran Out of Food in the Last Year: Never true  Transportation Needs: No Transportation Needs (08/25/2022)   PRAPARE - Hydrologist (Medical): No    Lack of Transportation (Non-Medical): No  Physical Activity: Not on file  Stress: Not on file  Social Connections: Not on file   Family History  Problem Relation Age of Onset  Seizures Mother    Heart attack Mother    Pancreatic cancer Father    Brain cancer Brother    Allergies  Allergen Reactions   Ciprofloxacin Other (See Comments)    Hallucinations   Metronidazole Other (See Comments)    Hallucinations   Norco [Hydrocodone-Acetaminophen]     Drowsy and confused   Prior to Admission medications   Medication Sig Start Date End Date Taking? Authorizing Provider  ALPRAZolam (XANAX) 0.25 MG tablet Take 0.25 mg by mouth at bedtime as needed. 02/13/22  Yes [provider]  clopidogrel (PLAVIX) 75 MG tablet Take 75 mg by mouth daily.   Yes [provider]  donepezil (ARICEPT) 10 MG tablet Take 10 mg by mouth at bedtime. 03/28/22  Yes [provider]  levETIRAcetam (KEPPRA) 100 MG/ML  solution Take 7.5 mLs by mouth 2 (two) times daily. 03/29/22  Yes [provider]  midodrine (PROAMATINE) 2.5 MG tablet Take 2.5 mg by mouth 2 (two) times daily. 03/28/22  Yes [provider]  modafinil (PROVIGIL) 100 MG tablet Take 100 mg by mouth daily. 08/08/22  Yes [provider]  potassium chloride (KLOR-CON M) 10 MEQ tablet Take 10 mEq by mouth 2 (two) times daily. 03/28/22  Yes [provider]  rosuvastatin (CRESTOR) 10 MG tablet Take 10 mg by mouth daily.   Yes [provider]  sertraline (ZOLOFT) 25 MG tablet Take 75 mg by mouth at bedtime.    Yes [provider]  risperiDONE (RISPERDAL) 0.25 MG tablet Take 0.25-0.5 mg by mouth daily as needed (agitation).    [provider]   Chest Portable 1 View  Result Date: 08/25/2022 CLINICAL DATA:  Dementia, post fall. EXAM: PORTABLE CHEST 1 VIEW COMPARISON:  August 25, 2022 CT of the chest, abdomen and pelvis FINDINGS: EKG leads project over the chest. Cardiomediastinal contours and hilar structures are stable. Lungs are clear.  No pneumothorax. Mild curvature of the spine with levo convexity at the thoracolumbar junction. No acute skeletal process on limited assessment. IMPRESSION: No acute cardiopulmonary disease. Electronically Signed   By: Zetta Bills M.D.   On: 08/25/2022 13:40   CT CHEST ABDOMEN PELVIS W CONTRAST  Result Date: 08/25/2022 CLINICAL DATA:  Unwitnessed fall. Pain with right hip and leg movement. Additional non localized pain. EXAM: CT CHEST, ABDOMEN, AND PELVIS WITH CONTRAST TECHNIQUE: Multidetector CT imaging of the chest, abdomen and pelvis was performed following the standard protocol during bolus administration of intravenous contrast. RADIATION DOSE REDUCTION: This exam was performed according to the departmental dose-optimization program which includes automated exposure control, adjustment of the mA and/or kV according to patient size and/or use of iterative  reconstruction technique. CONTRAST:  135m OMNIPAQUE IOHEXOL 300 MG/ML  SOLN COMPARISON:  Right hip radiographs obtained today. Portable chest dated 02/18/2020. Abdomen and pelvis CT dated 10/29/2012. FINDINGS: CT CHEST FINDINGS Cardiovascular: Mild aortic atheromatous changes. Borderline enlarged heart. Mediastinum/Nodes: No enlarged mediastinal, hilar, or axillary lymph nodes. Thyroid gland, trachea, and esophagus demonstrate no significant findings. Lungs/Pleura: Bilateral dependent atelectasis. Minimal centrilobular bullous changes. No pleural fluid or pneumothorax. Musculoskeletal: Mild thoracolumbar scoliosis. Thoracic spine degenerative changes. Severe left glenohumeral joint degenerative changes. No fracture or subluxation. CT ABDOMEN PELVIS FINDINGS Hepatobiliary: Multiple simple liver cysts are again demonstrated. These do not need imaging follow-up. Cholecystectomy clips with expected post cholecystectomy intrahepatic biliary ductal dilatation. Pancreas: Unremarkable. No pancreatic ductal dilatation or surrounding inflammatory changes. Spleen: Normal in size without focal abnormality. Adrenals/Urinary Tract: Normal-appearing adrenal glands. Tiny bilateral renal  cysts without significant change. Unremarkable ureters and urinary bladder. Stomach/Bowel: Mild sigmoid and descending colon diverticulosis without evidence of diverticulitis. Unremarkable stomach, small bowel and appendix. Vascular/Lymphatic: Atheromatous arterial calcifications without aneurysm. No enlarged lymph nodes. Reproductive: Uterus and left adnexa are unremarkable. The previously demonstrated 0.9 cm right ovarian cyst has peripheral wall calcifications and medium density and low-density components internally with an appearance suggesting a small dermoid. This currently measures 2.4 cm in maximum diameter previously measured 2.2 cm in corresponding diameter. Other: No abdominal wall hernia or abnormality. No abdominopelvic ascites.  Musculoskeletal: Right femoral neck fracture with mild anterior and proximal displacement of the distal fragment. Lumbar spine degenerative changes. No lumbar spine fractures or subluxations. IMPRESSION: 1. Right femoral neck fracture with mild anterior and proximal displacement of the distal fragment. 2. No acute abnormality in the chest, abdomen or pelvis. 3. Mild changes of COPD. 4. Mild sigmoid and descending colon diverticulosis. 5. 2.4 cm right ovarian dermoid with little change since 2014. This does not need imaging follow-up. Aortic Atherosclerosis (ICD10-I70.0). Electronically Signed   By: Claudie Revering M.D.   On: 08/25/2022 12:03   CT Head Wo Contrast  Result Date: 08/25/2022 CLINICAL DATA:  Non localized pain following an unwitnessed fall. EXAM: CT HEAD WITHOUT CONTRAST CT CERVICAL SPINE WITHOUT CONTRAST TECHNIQUE: Multidetector CT imaging of the head and cervical spine was performed following the standard protocol without intravenous contrast. Multiplanar CT image reconstructions of the cervical spine were also generated. RADIATION DOSE REDUCTION: This exam was performed according to the departmental dose-optimization program which includes automated exposure control, adjustment of the mA and/or kV according to patient size and/or use of iterative reconstruction technique. COMPARISON:  Head CT dated 04/11/2022 FINDINGS: CT HEAD FINDINGS Brain: Stable moderately enlarged ventricles and subarachnoid spaces. Stable moderate patchy white matter low density in both cerebral hemispheres. Stable densely calcified meningioma in the left lateral frontoparietal region. This currently measures 1.5 cm in maximum diameter, previously 1.4 cm in corresponding diameter. No intracranial hemorrhage, new mass lesion or CT evidence of acute infarction. Vascular: No hyperdense vessel or unexpected calcification. Skull: Normal. Negative for fracture or focal lesion. Sinuses/Orbits: Unremarkable. Other: None. CT CERVICAL  SPINE FINDINGS Alignment: Normal. Skull base and vertebrae: No acute fracture. No primary bone lesion or focal pathologic process. Soft tissues and spinal canal: No prevertebral fluid or swelling. No visible canal hematoma. Disc levels:  Multilevel degenerative changes. Upper chest: Clear lung apices. Other: Minimal bilateral carotid artery calcification. IMPRESSION: 1. No skull fracture or intracranial hemorrhage. 2. No cervical spine fracture or subluxation. 3. Stable moderate diffuse cerebral and cerebellar atrophy and moderate chronic small vessel white matter ischemic changes in both cerebral hemispheres. 4. Stable densely calcified meningioma in the left lateral frontoparietal region. 5. Multilevel cervical spine degenerative changes. 6. Minimal bilateral carotid artery atheromatous calcification. Electronically Signed   By: Claudie Revering M.D.   On: 08/25/2022 11:43   CT Cervical Spine Wo Contrast  Result Date: 08/25/2022 CLINICAL DATA:  Non localized pain following an unwitnessed fall. EXAM: CT HEAD WITHOUT CONTRAST CT CERVICAL SPINE WITHOUT CONTRAST TECHNIQUE: Multidetector CT imaging of the head and cervical spine was performed following the standard protocol without intravenous contrast. Multiplanar CT image reconstructions of the cervical spine were also generated. RADIATION DOSE REDUCTION: This exam was performed according to the departmental dose-optimization program which includes automated exposure control, adjustment of the mA and/or kV according to patient size and/or use of iterative reconstruction technique. COMPARISON:  Head CT dated 04/11/2022 FINDINGS: CT  HEAD FINDINGS Brain: Stable moderately enlarged ventricles and subarachnoid spaces. Stable moderate patchy white matter low density in both cerebral hemispheres. Stable densely calcified meningioma in the left lateral frontoparietal region. This currently measures 1.5 cm in maximum diameter, previously 1.4 cm in corresponding diameter. No  intracranial hemorrhage, new mass lesion or CT evidence of acute infarction. Vascular: No hyperdense vessel or unexpected calcification. Skull: Normal. Negative for fracture or focal lesion. Sinuses/Orbits: Unremarkable. Other: None. CT CERVICAL SPINE FINDINGS Alignment: Normal. Skull base and vertebrae: No acute fracture. No primary bone lesion or focal pathologic process. Soft tissues and spinal canal: No prevertebral fluid or swelling. No visible canal hematoma. Disc levels:  Multilevel degenerative changes. Upper chest: Clear lung apices. Other: Minimal bilateral carotid artery calcification. IMPRESSION: 1. No skull fracture or intracranial hemorrhage. 2. No cervical spine fracture or subluxation. 3. Stable moderate diffuse cerebral and cerebellar atrophy and moderate chronic small vessel white matter ischemic changes in both cerebral hemispheres. 4. Stable densely calcified meningioma in the left lateral frontoparietal region. 5. Multilevel cervical spine degenerative changes. 6. Minimal bilateral carotid artery atheromatous calcification. Electronically Signed   By: Claudie Revering M.D.   On: 08/25/2022 11:43   DG Hip Unilat W or Wo Pelvis 2-3 Views Right  Result Date: 08/25/2022 CLINICAL DATA:  Fall.  Right hip pain. EXAM: DG HIP (WITH OR WITHOUT PELVIS) 2-3V RIGHT COMPARISON:  None Available. FINDINGS: Mildly displaced right femoral neck fracture is seen. No other fractures identified. No evidence of dislocation. IMPRESSION: Mildly displaced right femoral neck fracture. Electronically Signed   By: Marlaine Hind M.D.   On: 08/25/2022 11:20    Positive ROS: All other systems have been reviewed and were otherwise negative with the exception of those mentioned in the HPI and as above.  Physical Exam: General: Alert, no acute distress  MUSCULOSKELETAL: Right hip: Patient skin is intact.  There is no erythema ecchymosis or swelling.  Her thigh compartments are soft compressible.  She has mild external  rotation and shortening.  Distally she is neurovascular intact and is intact motor function.  Assessment: Right femoral neck hip fracture  Plan: I explained to the patient and her daughter the nature of the patient's fracture.  I recommended a right hip hemiarthroplasty as treatment.  Reviewed the details of the operation as well as the postoperative course.  I discussed the risks and benefits of surgery. The risks include but are not limited to infection, bleeding requiring blood transfusion, nerve or blood vessel injury, joint stiffness or loss of motion, persistent pain, weakness or instability, fracture, dislocation, hardware failure and the need for further surgery. Medical risks include but are not limited to DVT and pulmonary embolism, myocardial infarction, stroke, pneumonia, respiratory failure and death. Patient understood these risks and wished to proceed.     Patient's Plavix is on hold.  I reviewed the patient's x-rays and lab work in preparation for this case.  Patient will be n.p.o. after midnight.    Thornton Park, MD    08/25/2022 7:06 PM

## 2022-08-25 NOTE — ED Provider Notes (Signed)
[facility] Provider Note    Event Date/Time   First MD Initiated Contact with Patient 08/25/22 1021     (approximate)   History   Chief Complaint Fall   HPI  Mallory Peters is a 81 y.o. female with past medical history of dementia, seizures, CHF, and stroke who presents to the ED following fall.  History is obtained from patient's daughter at bedside given patient's advanced dementia.  She states that she had gone upstairs to do some laundry when she heard a thud downstairs in the kitchen.  When she went downstairs, she found the patient down on the ground complaining of pain in her right hip.  Patient typically walks on her own, although daughter states she seemed to have decline over the past few weeks with more of a shuffling gait.  Patient has been unable to walk since the fall, seem to be in significant pain to her right hip with EMS.  It is unclear whether she hit her head or lost consciousness, she does take Plavix at home.     Physical Exam   Triage Vital Signs: ED Triage Vitals [08/25/22 1040]  Enc Vitals Group     BP (!) 140/68     Pulse Rate 73     Resp 20     Temp 97.7 F (36.5 C)     Temp Source Oral     SpO2 96 %     Weight      Height      Head Circumference      Peak Flow      Pain Score      Pain Loc      Pain Edu?      Excl. in [place]?     Most recent vital signs: Vitals:   08/25/22 1040  BP: (!) 140/68  Pulse: 73  Resp: 20  Temp: 97.7 F (36.5 C)  SpO2: 96%    Constitutional: Alert and oriented. Eyes: Conjunctivae are normal. Head: Atraumatic. Nose: No congestion/rhinnorhea. Mouth/Throat: Mucous membranes are moist.  Neck: No midline cervical spine tenderness to palpation. Cardiovascular: Normal rate, regular rhythm. Grossly normal heart sounds.  2+ radial and DP pulses bilaterally. Respiratory: Normal respiratory effort.  No retractions. Lungs CTAB.  Anterior chest wall tenderness to palpation  noted. Gastrointestinal: Soft and diffusely tender to palpation with no rebound or guarding. No distention. Musculoskeletal: Tenderness to palpation diffusely at right hip, no right knee or ankle tenderness.  No left hip, knee, or ankle tenderness to palpation.  No upper extremity bony tenderness to palpation. Neurologic:  Normal speech and language. No gross focal neurologic deficits are appreciated.    ED Results / Procedures / Treatments   Labs (all labs ordered are listed, but only abnormal results are displayed) Labs Reviewed  HEPATIC FUNCTION PANEL - Abnormal; Notable for the following components:      Result Value   Total Protein 6.3 (*)    All other components within normal limits  CBC  BASIC METABOLIC PANEL  PROTIME-INR  URINALYSIS, ROUTINE W REFLEX MICROSCOPIC  TYPE AND SCREEN  TYPE AND SCREEN     EKG  ED ECG REPORT I, Blake Divine, the attending physician, personally viewed and interpreted this ECG.   Date: 08/25/2022  EKG Time: 11:40  Rate: 71  Rhythm: normal sinus rhythm  Axis: Normal  Intervals:none  ST&T Change: None  RADIOLOGY Right hip x-ray reviewed and interpreted by me with displaced femoral neck fracture, no dislocation noted.  PROCEDURES:  Critical Care performed: No  Procedures   MEDICATIONS ORDERED IN ED: Medications  morphine (PF) 4 MG/ML injection 4 mg (4 mg Intravenous Given 08/25/22 1046)  iohexol (OMNIPAQUE) 300 MG/ML solution 100 mL (100 mLs Intravenous Contrast Given 08/25/22 1114)     IMPRESSION / MDM / ASSESSMENT AND PLAN / ED COURSE  I reviewed the triage vital signs and the nursing notes.                              81 y.o. female with past medical history of stroke, seizure, CHF, and dementia who presents to the ED following unwitnessed fall at home.  Patient's presentation is most consistent with acute presentation with potential threat to life or bodily function.  Differential diagnosis includes, but is not limited  to, intracranial injury, cervical spine injury, hip fracture, hip dislocation, intra-abdominal injury, rib fracture, hemothorax, pneumothorax, UTI, electrolyte abnormality.  Patient nontoxic-appearing and in no acute distress, vital signs are unremarkable.  She has no focal neurologic deficits on exam and appears to be at her baseline mental status, although daughter reports gradual decline over the past couple of weeks with increased shuffling gait.  She has significant pain with tenderness along the right hip but also diffuse tenderness to palpation of her abdomen and chest wall.  We will further assess with CT imaging of her head, cervical spine, chest/abdomen/pelvis.  Also check x-ray of right hip.  She remains neurovascularly intact to her distal right lower extremity with no evidence of bony injury to her other extremities.  Plan to treat with IV morphine, screen EKG and labs as well as urinalysis.  Right hip x-ray significant for displaced femoral neck fracture, CT imaging with no other traumatic injury noted.  Labs are reassuring with no significant anemia, leukocytosis, electrolyte abnormality, or AKI.  LFTs and INR are unremarkable.  Findings discussed with Dr. Mack Guise of orthopedics, who states that Plavix will need to washout before patient may be taken to the operating room.  Case discussed with hospitalist for admission.      FINAL CLINICAL IMPRESSION(S) / ED DIAGNOSES   Final diagnoses:  Closed fracture of right hip, initial encounter [facility])  Fall, initial encounter     Rx / DC Orders   ED Discharge Orders     None        Note:  This document was prepared using Dragon voice recognition software and may include unintentional dictation errors.   Blake Divine, MD 08/25/22 1250

## 2022-08-25 NOTE — ED Notes (Signed)
Pt placed on Purewick by RN and asked to urinate if possible.

## 2022-08-25 NOTE — Assessment & Plan Note (Signed)
-   Echo on 02/17/2020: read as estimated ejection fraction 40 to 15%, grade 1 diastolic dysfunction

## 2022-08-25 NOTE — H&P (Signed)
History and Physical   NIXON SPARR EHO:[id number] DOB: 1941/07/10 DOA: 08/25/2022  PCP: Idelle Crouch, MD  Patient coming from: Home via EMS  I have personally briefly reviewed patient's old medical records in [place].  Chief Concern: Fall  HPI: [name] is a 81 year old female with hypertension, dementia, anxiety, depression, hyperlipidemia, who presents emergency department for chief concerns of unwitnessed fall and right hip pain.  Initial vitals in the emergency department showed temperature of 97.7, respiration rate of 20, heart rate of 73, blood pressure 140/68, SPO2 of 96% on room air.  Serum sodium is 139, potassium 3.7, chloride 108, bicarb 25, BUN of 14, serum creatinine of 0.80, eGFR greater than 60, nonfasting blood glucose 91, WBC 5.4, hemoglobin 13.6, platelets of 164.  UA has been ordered and pending.  Right hip pelvis x-ray 2-3 view: Was read as mildly displaced right femoral neck fracture.  CT head without contrast and CT cervical spine without contrast: Was read as no skull fracture or intracranial hemorrhage.  No cervical spine fracture or subluxation.  Stable mild diffuse cerebral and cerebellar atrophy and moderate chronic small vessel white matter ischemic changes in both cerebral hemispheres.  Stable densely calcified meningioma in the left lateral frontoparietal region.  CT chest abdomen pelvis with contrast: No acute abnormality in the chest, abdomen or pelvis.  Right femoral neck fracture with mild anterior and proximal displacement of the distal fragment.  Mild changes of COPD.  Mild sigmoid and descending: Diverticulosis.  2.4 cm right ovarian dermoid with little changes since 2014.  ED treatment: Morphine 4 mg IV one-time dose,  At bedside, she is able to tell me her name. She said she was 81 years old, and that she is at her sister's house. She was not able to tell me the current calendar year.   Patient lives with daughter.  They were downstairs int eh morning and she had taken her AM medications. Daughter went upstairs to get laundry and when Mallory Peters came down, patient was laying on the floor on her back. She Dors some pain.   Social history: Patient lives with daughter, Mallory Peters. Daughter denies tobacco, etoh, and recreational drug. She was a homemaker.   ROS: Unable to complete as patient has moderate to advanced dementia  ED Course: Discussed with EDP, patient requiring   Assessment/Plan  Principal Problem:   Closed displaced fracture of left femoral neck (HCC) Active Problems:   Dementia of the Alzheimer's type (HCC)   Seizures (HCC)   Chronic diastolic CHF (congestive heart failure) (HCC)   Meningioma (HCC)   Depression   HLD (hyperlipidemia)   Assessment and Plan:  * Closed displaced fracture of left femoral neck ([place]) - Fall precautions - Presumed secondary to mechanical fall however as patient has dementia we will check for UA and COVID/influenza A/influenza B PCR to assess for other etiology of fall - UA, COVID/influenza A/influenza B PCR have been ordered and pending collection - Supportive measures: Hydrocodone-acetaminophen 5-325 mg tablet, 1 to 2 tablets p.o. every 6 hours as needed for moderate pain; morphine 1 mg IV every 3 hours as needed for severe pain, 18 hours ordered - AM team to reevaluate patient at bedside to determine continued opioid pain medication requirements - Orthopedic service has been consulted, Dr. Mack Guise states he will see the patient however given that patient has taken Plavix patient will need approximately 24-hour washout - Admit to MedSurg, inpatient, no telemetry  HLD (hyperlipidemia) - Rosuvastatin 10 mg nightly  resumed  Depression With anxiety - Resumed home sertraline 75 mg daily nightly, alprazolam 0.25 mg p.o. nightly for anxiety  Meningioma (HCC) - Appears calcified on CT imaging - Patient and family to continue follow-up outpatient  Chronic diastolic  CHF (congestive heart failure) ([place]) - Echo on 02/17/2020: read as estimated ejection fraction 40 to 05%, grade 1 diastolic dysfunction  Seizures ([place]) - Resumed home Keppra 750 mg p.o. twice daily - Lorazepam 1 mg IV as needed for seizure, 2 doses ordered with instructions for nursing staff to administer as appropriate and then let provider know  Dementia of the Alzheimer's type ([place]) - Home donepezil 10 mg nightly resume  Chart reviewed.   DVT prophylaxis-heparin 5000 units subcutaneous every 8 hours, 2 doses ordered for DVT prophylaxis in anticipation of procedure in 24 to 48 hours pending orthopedic evaluation and washout of Plavix - AM team to reinitiate pharmacologic DVT prophylaxis when the benefits outweigh the risk  DVT prophylaxis: Heparin 5000 units subcutaneous every 8 hours, 2 doses ordered Code Status: DNR Diet: Heart healthy Family Communication: Mitchell Epling is dauthter and she is at bedside. She is POA.  Disposition Plan: Pending clinical course Consults called: Orthopedic Admission status: MedSurg, inpatient  Past Medical History:  Diagnosis Date   Anginal pain ([place])    Anxiety    B12 deficiency    Colon polyps    CVD (cardiovascular disease)    Dementia ([place])    Depression    Dysphagia    Dyspnea    Esophageal dysmotility    Gastritis    GERD (gastroesophageal reflux disease)    Hemorrhoids    HNP (herniated nucleus pulposus), lumbar    Hyperlipidemia    Hypertension    Insomnia    Lipids blood increased    Meningioma (HCC)    Non-cardiac chest pain    Osteoarthritis    Polyp of cervix    Stroke (cerebrum) ([place])    Past Surgical History:  Procedure Laterality Date   BIOPSY SHOULDER     CHOLECYSTECTOMY     colonic polyps     COLONOSCOPY     COLONOSCOPY WITH PROPOFOL N/A 04/28/2018   Procedure: COLONOSCOPY WITH PROPOFOL;  Surgeon: Manya Silvas, MD;  Location[facility] ENDOSCOPY;  Service: Endoscopy;  Laterality: N/A;    ESOPHAGOGASTRODUODENOSCOPY (EGD) WITH PROPOFOL N/A 07/12/2016   Procedure: ESOPHAGOGASTRODUODENOSCOPY (EGD) WITH PROPOFOL;  Surgeon: Manya Silvas, MD;  Location[facility] ENDOSCOPY;  Service: Endoscopy;  Laterality: N/A;   SAVORY DILATION N/A 07/12/2016   Procedure: SAVORY DILATION;  Surgeon: Manya Silvas, MD;  Location: [facility] ENDOSCOPY;  Service: Endoscopy;  Laterality: N/A;   TUBAL LIGATION     Social History:  reports that she has never smoked. She has never used smokeless tobacco. She reports that she does not drink alcohol and does not use drugs.  Allergies  Allergen Reactions   Ciprofloxacin Other (See Comments)    Hallucinations   Metronidazole Other (See Comments)    Hallucinations   Norco [Hydrocodone-Acetaminophen]     Drowsy and confused   Family History  Problem Relation Age of Onset   Seizures Mother    Heart attack Mother    Pancreatic cancer Father    Brain cancer Brother    Family history: Family history reviewed and not pertinent  Prior to Admission medications   Medication Sig Start Date End Date Taking? Authorizing Provider  ALPRAZolam (XANAX) 0.25 MG tablet Take 0.25 mg by mouth at bedtime as needed. 02/13/22  [provider]  clopidogrel (PLAVIX) 75 MG tablet Take 75 mg by mouth daily.    [provider]  donepezil (ARICEPT) 10 MG tablet Take 10 mg by mouth at bedtime. 03/28/22   [provider]  levETIRAcetam (KEPPRA) 100 MG/ML solution Take 7.5 mLs by mouth 2 (two) times daily. 03/29/22   [provider]  midodrine (PROAMATINE) 2.5 MG tablet Take 2.5 mg by mouth 2 (two) times daily. 03/28/22   [provider]  potassium chloride (KLOR-CON M) 10 MEQ tablet Take 10 mEq by mouth 2 (two) times daily. 03/28/22   [provider]  rosuvastatin (CRESTOR) 10 MG tablet Take 10 mg by mouth daily.    [provider]  sertraline (ZOLOFT) 25 MG tablet Take 75 mg by mouth at bedtime.     [provider]    Physical Exam: Vitals:   08/25/22 1130 08/25/22 1200 08/25/22 1230 08/25/22 1330  BP: 134/73 133/71 135/69 121/78  Pulse: 77 73 76 81  Resp:  _0 Temp:      TempSrc:      SpO2: 93% 96% 95% 95%  Weight:      Height:       Constitutional: appears age-appropriate, frail, NAD, calm, comfortable Eyes: PERRL, lids and conjunctivae normal ENMT: Mucous membranes are moist. Posterior pharynx clear of any exudate or lesions. Age-appropriate dentition. Hearing appropriate Neck: normal, supple, no masses, no thyromegaly Respiratory: clear to auscultation bilaterally, no wheezing, no crackles. Normal respiratory effort. No accessory muscle use.  Cardiovascular: Regular rate and rhythm, no murmurs / rubs / gallops. No extremity edema. 2+ pedal pulses. No carotid bruits.  Abdomen: no tenderness, no masses palpated, no hepatosplenomegaly. Bowel sounds positive.  Musculoskeletal: no clubbing / cyanosis. No joint deformity upper and lower extremities. no contractures, no atrophy. Normal muscle tone.  Decreased range of motion of the right lower extremity.  Pain at the right hip. Skin: no rashes, lesions, ulcers. No induration Neurologic: Sensation intact. Strength 5/5 in all 4.  Psychiatric: Normal judgment and insight. Alert and oriented x 3. Normal mood.   EKG: independently reviewed, showing sinus rhythm with rate of 71, QTc 434  Chest x-ray on Admission: I personally reviewed and I agree with radiologist reading as below.  Chest Portable 1 View  Result Date: 08/25/2022 CLINICAL DATA:  Dementia, post fall. EXAM: PORTABLE CHEST 1 VIEW COMPARISON:  August 25, 2022 CT of the chest, abdomen and pelvis FINDINGS: EKG leads project over the chest. Cardiomediastinal contours and hilar structures are stable. Lungs are clear.  No pneumothorax. Mild curvature of the spine with levo convexity at the thoracolumbar junction. No acute skeletal process on limited assessment. IMPRESSION: No acute  cardiopulmonary disease. Electronically Signed   By: Zetta Bills M.D.   On: 08/25/2022 13:40   CT CHEST ABDOMEN PELVIS W CONTRAST  Result Date: 08/25/2022 CLINICAL DATA:  Unwitnessed fall. Pain with right hip and leg movement. Additional non localized pain. EXAM: CT CHEST, ABDOMEN, AND PELVIS WITH CONTRAST TECHNIQUE: Multidetector CT imaging of the chest, abdomen and pelvis was performed following the standard protocol during bolus administration of intravenous contrast. RADIATION DOSE REDUCTION: This exam was performed according to the departmental dose-optimization program which includes automated exposure control, adjustment of the mA and/or kV according to patient size and/or use of iterative reconstruction technique. CONTRAST:  15m OMNIPAQUE IOHEXOL 300 MG/ML  SOLN COMPARISON:  Right hip radiographs obtained today. Portable chest dated 02/18/2020. Abdomen and pelvis CT dated 10/29/2012. FINDINGS: CT  CHEST FINDINGS Cardiovascular: Mild aortic atheromatous changes. Borderline enlarged heart. Mediastinum/Nodes: No enlarged mediastinal, hilar, or axillary lymph nodes. Thyroid gland, trachea, and esophagus demonstrate no significant findings. Lungs/Pleura: Bilateral dependent atelectasis. Minimal centrilobular bullous changes. No pleural fluid or pneumothorax. Musculoskeletal: Mild thoracolumbar scoliosis. Thoracic spine degenerative changes. Severe left glenohumeral joint degenerative changes. No fracture or subluxation. CT ABDOMEN PELVIS FINDINGS Hepatobiliary: Multiple simple liver cysts are again demonstrated. These do not need imaging follow-up. Cholecystectomy clips with expected post cholecystectomy intrahepatic biliary ductal dilatation. Pancreas: Unremarkable. No pancreatic ductal dilatation or surrounding inflammatory changes. Spleen: Normal in size without focal abnormality. Adrenals/Urinary Tract: Normal-appearing adrenal glands. Tiny bilateral renal cysts without significant change.  Unremarkable ureters and urinary bladder. Stomach/Bowel: Mild sigmoid and descending colon diverticulosis without evidence of diverticulitis. Unremarkable stomach, small bowel and appendix. Vascular/Lymphatic: Atheromatous arterial calcifications without aneurysm. No enlarged lymph nodes. Reproductive: Uterus and left adnexa are unremarkable. The previously demonstrated 0.9 cm right ovarian cyst has peripheral wall calcifications and medium density and low-density components internally with an appearance suggesting a small dermoid. This currently measures 2.4 cm in maximum diameter previously measured 2.2 cm in corresponding diameter. Other: No abdominal wall hernia or abnormality. No abdominopelvic ascites. Musculoskeletal: Right femoral neck fracture with mild anterior and proximal displacement of the distal fragment. Lumbar spine degenerative changes. No lumbar spine fractures or subluxations. IMPRESSION: 1. Right femoral neck fracture with mild anterior and proximal displacement of the distal fragment. 2. No acute abnormality in the chest, abdomen or pelvis. 3. Mild changes of COPD. 4. Mild sigmoid and descending colon diverticulosis. 5. 2.4 cm right ovarian dermoid with little change since 2014. This does not need imaging follow-up. Aortic Atherosclerosis (ICD10-I70.0). Electronically Signed   By: Claudie Revering M.D.   On: 08/25/2022 12:03   CT Head Wo Contrast  Result Date: 08/25/2022 CLINICAL DATA:  Non localized pain following an unwitnessed fall. EXAM: CT HEAD WITHOUT CONTRAST CT CERVICAL SPINE WITHOUT CONTRAST TECHNIQUE: Multidetector CT imaging of the head and cervical spine was performed following the standard protocol without intravenous contrast. Multiplanar CT image reconstructions of the cervical spine were also generated. RADIATION DOSE REDUCTION: This exam was performed according to the departmental dose-optimization program which includes automated exposure control, adjustment of the mA and/or  kV according to patient size and/or use of iterative reconstruction technique. COMPARISON:  Head CT dated 04/11/2022 FINDINGS: CT HEAD FINDINGS Brain: Stable moderately enlarged ventricles and subarachnoid spaces. Stable moderate patchy white matter low density in both cerebral hemispheres. Stable densely calcified meningioma in the left lateral frontoparietal region. This currently measures 1.5 cm in maximum diameter, previously 1.4 cm in corresponding diameter. No intracranial hemorrhage, new mass lesion or CT evidence of acute infarction. Vascular: No hyperdense vessel or unexpected calcification. Skull: Normal. Negative for fracture or focal lesion. Sinuses/Orbits: Unremarkable. Other: None. CT CERVICAL SPINE FINDINGS Alignment: Normal. Skull base and vertebrae: No acute fracture. No primary bone lesion or focal pathologic process. Soft tissues and spinal canal: No prevertebral fluid or swelling. No visible canal hematoma. Disc levels:  Multilevel degenerative changes. Upper chest: Clear lung apices. Other: Minimal bilateral carotid artery calcification. IMPRESSION: 1. No skull fracture or intracranial hemorrhage. 2. No cervical spine fracture or subluxation. 3. Stable moderate diffuse cerebral and cerebellar atrophy and moderate chronic small vessel white matter ischemic changes in both cerebral hemispheres. 4. Stable densely calcified meningioma in the left lateral frontoparietal region. 5. Multilevel cervical spine degenerative changes. 6. Minimal bilateral carotid artery atheromatous calcification. Electronically Signed   By:  Claudie Revering M.D.   On: 08/25/2022 11:43   CT Cervical Spine Wo Contrast  Result Date: 08/25/2022 CLINICAL DATA:  Non localized pain following an unwitnessed fall. EXAM: CT HEAD WITHOUT CONTRAST CT CERVICAL SPINE WITHOUT CONTRAST TECHNIQUE: Multidetector CT imaging of the head and cervical spine was performed following the standard protocol without intravenous contrast. Multiplanar  CT image reconstructions of the cervical spine were also generated. RADIATION DOSE REDUCTION: This exam was performed according to the departmental dose-optimization program which includes automated exposure control, adjustment of the mA and/or kV according to patient size and/or use of iterative reconstruction technique. COMPARISON:  Head CT dated 04/11/2022 FINDINGS: CT HEAD FINDINGS Brain: Stable moderately enlarged ventricles and subarachnoid spaces. Stable moderate patchy white matter low density in both cerebral hemispheres. Stable densely calcified meningioma in the left lateral frontoparietal region. This currently measures 1.5 cm in maximum diameter, previously 1.4 cm in corresponding diameter. No intracranial hemorrhage, new mass lesion or CT evidence of acute infarction. Vascular: No hyperdense vessel or unexpected calcification. Skull: Normal. Negative for fracture or focal lesion. Sinuses/Orbits: Unremarkable. Other: None. CT CERVICAL SPINE FINDINGS Alignment: Normal. Skull base and vertebrae: No acute fracture. No primary bone lesion or focal pathologic process. Soft tissues and spinal canal: No prevertebral fluid or swelling. No visible canal hematoma. Disc levels:  Multilevel degenerative changes. Upper chest: Clear lung apices. Other: Minimal bilateral carotid artery calcification. IMPRESSION: 1. No skull fracture or intracranial hemorrhage. 2. No cervical spine fracture or subluxation. 3. Stable moderate diffuse cerebral and cerebellar atrophy and moderate chronic small vessel white matter ischemic changes in both cerebral hemispheres. 4. Stable densely calcified meningioma in the left lateral frontoparietal region. 5. Multilevel cervical spine degenerative changes. 6. Minimal bilateral carotid artery atheromatous calcification. Electronically Signed   By: Claudie Revering M.D.   On: 08/25/2022 11:43   DG Hip Unilat W or Wo Pelvis 2-3 Views Right  Result Date: 08/25/2022 CLINICAL DATA:  Fall.   Right hip pain. EXAM: DG HIP (WITH OR WITHOUT PELVIS) 2-3V RIGHT COMPARISON:  None Available. FINDINGS: Mildly displaced right femoral neck fracture is seen. No other fractures identified. No evidence of dislocation. IMPRESSION: Mildly displaced right femoral neck fracture. Electronically Signed   By: Marlaine Hind M.D.   On: 08/25/2022 11:20    Labs on Admission: I have personally reviewed following labs  CBC: Recent Labs  Lab 08/25/22 1030  WBC 5.4  HGB 13.6  HCT 41.9  MCV 86.2  PLT 858   Basic Metabolic Panel: Recent Labs  Lab 08/25/22 1030  NA 139  K 3.7  CL 108  CO2 25  GLUCOSE 91  BUN 14  CREATININE 0.80  CALCIUM 9.3   GFR: Estimated Creatinine Clearance: 43.6 mL/min (by C-G formula based on SCr of 0.8 mg/dL).  Liver Function Tests: Recent Labs  Lab 08/25/22 1041  AST 20  ALT 15  ALKPHOS 64  BILITOT 0.8  PROT 6.3*  ALBUMIN 3.8   Coagulation Profile: Recent Labs  Lab 08/25/22 1030  INR 1.1   Urine analysis:    Component Value Date/Time   COLORURINE YELLOW (A) 04/11/2022 1844   APPEARANCEUR CLEAR (A) 04/11/2022 1844   LABSPEC 1.014 04/11/2022 1844   PHURINE 5.0 04/11/2022 1844   GLUCOSEU NEGATIVE 04/11/2022 1844   HGBUR SMALL (A) 04/11/2022 1844   BILIRUBINUR NEGATIVE 04/11/2022 1844   KETONESUR NEGATIVE 04/11/2022 1844   PROTEINUR NEGATIVE 04/11/2022 1844   NITRITE NEGATIVE 04/11/2022 1844   LEUKOCYTESUR NEGATIVE 04/11/2022 1844  Dr. Tobie Poet [facility]  If 7PM-7AM, please contact overnight-coverage provider If 7AM-7PM, please contact day coverage provider [email or web address]  08/25/2022, 4:19 PM

## 2022-08-25 NOTE — Assessment & Plan Note (Signed)
-   Home donepezil 10 mg nightly resume

## 2022-08-25 NOTE — Assessment & Plan Note (Signed)
-   Resumed home Keppra 750 mg p.o. twice daily - Lorazepam 1 mg IV as needed for seizure, 2 doses ordered with instructions for nursing staff to administer as appropriate and then let provider know

## 2022-08-25 NOTE — Assessment & Plan Note (Signed)
-   Appears calcified on CT imaging - Patient and family to continue follow-up outpatient

## 2022-08-26 ENCOUNTER — Inpatient Hospital Stay: Payer: Medicare Other

## 2022-08-26 ENCOUNTER — Other Ambulatory Visit: Payer: Self-pay

## 2022-08-26 ENCOUNTER — Encounter
Admission: EM | Disposition: A | Discharge status: Skilled Nursing Facility | Payer: Self-pay | Source: Home / Self Care | Attending: Internal Medicine

## 2022-08-26 ENCOUNTER — Inpatient Hospital Stay: Payer: Medicare Other | Admitting: Anesthesiology

## 2022-08-26 DIAGNOSIS — S72002A Fracture of unspecified part of neck of left femur, initial encounter for closed fracture: Secondary | ICD-10-CM | POA: Diagnosis not present

## 2022-08-26 HISTORY — PX: HIP ARTHROPLASTY: SHX981

## 2022-08-26 LAB — BASIC METABOLIC PANEL
Anion gap: 7 (ref 5–15)
BUN: 10 mg/dL (ref 8–23)
CO2: 25 mmol/L (ref 22–32)
Calcium: 9.4 mg/dL (ref 8.9–10.3)
Chloride: 106 mmol/L (ref 98–111)
Creatinine, Ser: 0.7 mg/dL (ref 0.44–1.00)
GFR, Estimated: >60 mL/min (ref >60)
Glucose, Bld: 125 mg/dL — ABNORMAL HIGH (ref 70–99)
Potassium: 3.5 mmol/L (ref 3.5–5.1)
Sodium: 138 mmol/L (ref 135–145)

## 2022-08-26 LAB — CBC
HCT: 40.2 % (ref 36.0–46.0)
HCT: 37.3 % (ref 36.0–46.0)
Hemoglobin: 13.5 g/dL (ref 12.0–15.0)
Hemoglobin: 12.5 g/dL (ref 12.0–15.0)
MCH: 28.2 pg (ref 26.0–34.0)
MCH: 28.5 pg (ref 26.0–34.0)
MCHC: 33.6 g/dL (ref 30.0–36.0)
MCHC: 33.5 g/dL (ref 30.0–36.0)
MCV: 84.1 fL (ref 80.0–100.0)
MCV: 85.2 fL (ref 80.0–100.0)
Platelets: 159 10*3/uL (ref 150–400)
Platelets: 215 10*3/uL (ref 150–400)
RBC: 4.78 MIL/uL (ref 3.87–5.11)
RBC: 4.38 MIL/uL (ref 3.87–5.11)
RDW: 12.2 % (ref 11.5–15.5)
RDW: 12.3 % (ref 11.5–15.5)
WBC: 7.6 10*3/uL (ref 4.0–10.5)
WBC: 13.3 10*3/uL — ABNORMAL HIGH (ref 4.0–10.5)
nRBC: 0 % (ref 0.0–0.2)
nRBC: 0 % (ref 0.0–0.2)

## 2022-08-26 SURGERY — HEMIARTHROPLASTY, HIP, DIRECT ANTERIOR APPROACH, FOR FRACTURE
Anesthesia: General | Site: Hip | Laterality: Right

## 2022-08-26 MED ORDER — PHENOL 1.4 % MT LIQD: 1.0000 | OROMUCOSAL | Status: DC | PRN

## 2022-08-26 MED ORDER — SODIUM CHLORIDE 0.9 % IR SOLN
Status: DC | PRN
  Administered 2022-08-26: 3012 mL

## 2022-08-26 MED ORDER — DOCUSATE SODIUM 100 MG PO CAPS
100.0000 mg | ORAL_CAPSULE | Freq: Two times a day (BID) | ORAL | Status: DC
  Administered 2022-08-26 – 2022-08-30 (×9): 100 mg via ORAL
  Filled 2022-08-26 (×10): qty 1

## 2022-08-26 MED ORDER — ONDANSETRON HCL 4 MG/2ML IJ SOLN
INTRAMUSCULAR | Status: DC | PRN
  Administered 2022-08-26: 4 mg via INTRAVENOUS

## 2022-08-26 MED ORDER — FENTANYL CITRATE (PF) 100 MCG/2ML IJ SOLN
INTRAMUSCULAR | Status: DC | PRN
  Administered 2022-08-26 (×4): 25 ug via INTRAVENOUS

## 2022-08-26 MED ORDER — FENTANYL CITRATE (PF) 100 MCG/2ML IJ SOLN
INTRAMUSCULAR | Status: AC
  Filled 2022-08-26: qty 2

## 2022-08-26 MED ORDER — ONDANSETRON HCL 4 MG/2ML IJ SOLN: 4.0000 mg | Freq: Once | INTRAMUSCULAR | Status: DC | PRN

## 2022-08-26 MED ORDER — LACTATED RINGERS IV SOLN: INTRAVENOUS | Status: DC | PRN

## 2022-08-26 MED ORDER — DEXAMETHASONE SODIUM PHOSPHATE 10 MG/ML IJ SOLN
INTRAMUSCULAR | Status: DC | PRN
  Administered 2022-08-26: 8 mg via INTRAVENOUS

## 2022-08-26 MED ORDER — SODIUM CHLORIDE 0.9 % IR SOLN
Status: DC | PRN
  Administered 2022-08-26: 1004 mL

## 2022-08-26 MED ORDER — LIDOCAINE HCL (CARDIAC) PF 100 MG/5ML IV SOSY
PREFILLED_SYRINGE | INTRAVENOUS | Status: DC | PRN
  Administered 2022-08-26: 70 mg via INTRAVENOUS

## 2022-08-26 MED ORDER — ROCURONIUM BROMIDE 100 MG/10ML IV SOLN
INTRAVENOUS | Status: DC | PRN
  Administered 2022-08-26: 40 mg via INTRAVENOUS
  Administered 2022-08-26: 30 mg via INTRAVENOUS

## 2022-08-26 MED ORDER — SODIUM CHLORIDE 0.9 % IV SOLN: INTRAVENOUS | Status: DC

## 2022-08-26 MED ORDER — GELATIN ABSORBABLE 100 CM EX MISC
CUTANEOUS | Status: AC
  Filled 2022-08-26: qty 1

## 2022-08-26 MED ORDER — GELATIN ABSORBABLE 12-7 MM EX MISC
CUTANEOUS | Status: DC | PRN
  Administered 2022-08-26: 1

## 2022-08-26 MED ORDER — PHENYLEPHRINE 80 MCG/ML (10ML) SYRINGE FOR IV PUSH (FOR BLOOD PRESSURE SUPPORT)
PREFILLED_SYRINGE | INTRAVENOUS | Status: AC
  Filled 2022-08-26: qty 10

## 2022-08-26 MED ORDER — ACETAMINOPHEN 500 MG PO TABS
1000.0000 mg | ORAL_TABLET | Freq: Four times a day (QID) | ORAL | Status: AC
  Administered 2022-08-26 – 2022-08-27 (×4): 1000 mg via ORAL
  Filled 2022-08-26 (×4): qty 2

## 2022-08-26 MED ORDER — ASPIRIN 81 MG PO TBEC
81.0000 mg | DELAYED_RELEASE_TABLET | Freq: Two times a day (BID) | ORAL | Status: DC
  Administered 2022-08-26 – 2022-08-31 (×11): 81 mg via ORAL
  Filled 2022-08-26 (×11): qty 1

## 2022-08-26 MED ORDER — OXYCODONE HCL 5 MG PO TABS: 5.0000 mg | ORAL_TABLET | ORAL | Status: DC | PRN

## 2022-08-26 MED ORDER — CEFAZOLIN SODIUM-DEXTROSE 2-4 GM/100ML-% IV SOLN
INTRAVENOUS | Status: AC
  Filled 2022-08-26: qty 100

## 2022-08-26 MED ORDER — METHOCARBAMOL 1000 MG/10ML IJ SOLN: 500.0000 mg | Freq: Four times a day (QID) | INTRAVENOUS | Status: DC | PRN

## 2022-08-26 MED ORDER — GELATIN ABSORBABLE 12-7 MM EX MISC
CUTANEOUS | Status: AC
  Filled 2022-08-26: qty 1

## 2022-08-26 MED ORDER — CLOPIDOGREL BISULFATE 75 MG PO TABS
75.0000 mg | ORAL_TABLET | Freq: Every day | ORAL | Status: DC
  Administered 2022-08-27 – 2022-08-31 (×5): 75 mg via ORAL
  Filled 2022-08-26 (×5): qty 1

## 2022-08-26 MED ORDER — PROPOFOL 10 MG/ML IV BOLUS
INTRAVENOUS | Status: DC | PRN
  Administered 2022-08-26: 120 mg via INTRAVENOUS

## 2022-08-26 MED ORDER — SENNA 8.6 MG PO TABS
1.0000 | ORAL_TABLET | Freq: Two times a day (BID) | ORAL | Status: DC
  Administered 2022-08-26 – 2022-08-30 (×9): 8.6 mg via ORAL
  Filled 2022-08-26 (×10): qty 1

## 2022-08-26 MED ORDER — BISACODYL 10 MG RE SUPP
10.0000 mg | Freq: Every day | RECTAL | Status: DC | PRN
  Administered 2022-08-29: 10 mg via RECTAL
  Filled 2022-08-26: qty 1

## 2022-08-26 MED ORDER — ONDANSETRON HCL 4 MG/2ML IJ SOLN: 4.0000 mg | Freq: Four times a day (QID) | INTRAMUSCULAR | Status: DC | PRN

## 2022-08-26 MED ORDER — LEVETIRACETAM 100 MG/ML PO SOLN
750.0000 mg | Freq: Two times a day (BID) | ORAL | Status: DC
  Administered 2022-08-26 – 2022-08-31 (×11): 750 mg via ORAL
  Filled 2022-08-26 (×11): qty 7.5

## 2022-08-26 MED ORDER — METHOCARBAMOL 500 MG PO TABS: 500.0000 mg | ORAL_TABLET | Freq: Four times a day (QID) | ORAL | Status: DC | PRN

## 2022-08-26 MED ORDER — POLYETHYLENE GLYCOL 3350 17 G PO PACK: 17.0000 g | PACK | Freq: Every day | ORAL | Status: DC | PRN

## 2022-08-26 MED ORDER — PHENYLEPHRINE HCL-NACL 20-0.9 MG/250ML-% IV SOLN
INTRAVENOUS | Status: DC | PRN
  Administered 2022-08-26: 20 ug/min via INTRAVENOUS

## 2022-08-26 MED ORDER — ALUM & MAG HYDROXIDE-SIMETH 200-200-20 MG/5ML PO SUSP: 30.0000 mL | ORAL | Status: DC | PRN

## 2022-08-26 MED ORDER — NEOMYCIN-POLYMYXIN B GU 40-200000 IR SOLN
Status: AC
  Filled 2022-08-26: qty 20

## 2022-08-26 MED ORDER — ENSURE ENLIVE PO LIQD
237.0000 mL | Freq: Two times a day (BID) | ORAL | Status: DC
  Administered 2022-08-28 (×2): 237 mL via ORAL

## 2022-08-26 MED ORDER — PROPOFOL 10 MG/ML IV BOLUS
INTRAVENOUS | Status: AC
  Filled 2022-08-26: qty 20

## 2022-08-26 MED ORDER — FENTANYL CITRATE (PF) 100 MCG/2ML IJ SOLN: 25.0000 ug | INTRAMUSCULAR | Status: DC | PRN

## 2022-08-26 MED ORDER — MORPHINE SULFATE (PF) 2 MG/ML IV SOLN: 2.0000 mg | INTRAVENOUS | Status: DC | PRN

## 2022-08-26 MED ORDER — PHENYLEPHRINE HCL (PRESSORS) 10 MG/ML IV SOLN
INTRAVENOUS | Status: DC | PRN
  Administered 2022-08-26: 80 ug via INTRAVENOUS
  Administered 2022-08-26: 160 ug via INTRAVENOUS
  Administered 2022-08-26 (×2): 80 ug via INTRAVENOUS
  Administered 2022-08-26: 160 ug via INTRAVENOUS

## 2022-08-26 MED ORDER — ONDANSETRON HCL 4 MG PO TABS: 4.0000 mg | ORAL_TABLET | Freq: Four times a day (QID) | ORAL | Status: DC | PRN

## 2022-08-26 MED ORDER — ACETAMINOPHEN 325 MG PO TABS
325.0000 mg | ORAL_TABLET | Freq: Four times a day (QID) | ORAL | Status: DC | PRN
  Administered 2022-08-28 – 2022-08-29 (×2): 650 mg via ORAL
  Filled 2022-08-26 (×2): qty 2

## 2022-08-26 MED ORDER — MENTHOL 3 MG MT LOZG: 1.0000 | LOZENGE | OROMUCOSAL | Status: DC | PRN

## 2022-08-26 MED ORDER — SUGAMMADEX SODIUM 200 MG/2ML IV SOLN
INTRAVENOUS | Status: DC | PRN
  Administered 2022-08-26: 125 mg via INTRAVENOUS

## 2022-08-26 MED ORDER — CEFAZOLIN SODIUM-DEXTROSE 2-4 GM/100ML-% IV SOLN
2.0000 g | Freq: Four times a day (QID) | INTRAVENOUS | Status: AC
  Administered 2022-08-26 (×2): 2 g via INTRAVENOUS
  Filled 2022-08-26 (×2): qty 100

## 2022-08-26 MED ORDER — TRAMADOL HCL 50 MG PO TABS
50.0000 mg | ORAL_TABLET | Freq: Four times a day (QID) | ORAL | Status: DC
  Administered 2022-08-26 – 2022-08-29 (×7): 50 mg via ORAL
  Filled 2022-08-26 (×10): qty 1

## 2022-08-26 SURGICAL SUPPLY — 48 items
BLADE SAGITTAL WIDE XTHICK NO (BLADE) ×1 IMPLANT
BLADE SURG SZ10 CARB STEEL (BLADE) ×1 IMPLANT
BNDG COHESIVE 4X5 TAN STRL LF (GAUZE/BANDAGES/DRESSINGS) ×1 IMPLANT
COVER BACK TABLE REUSABLE LG (DRAPES) ×1 IMPLANT
DRAPE 3/4 80X56 (DRAPES) ×2 IMPLANT
DRAPE INCISE IOBAN 66X60 STRL (DRAPES) ×1 IMPLANT
DRAPE ORTHO SPLIT 77X108 STRL (DRAPES) ×2
DRAPE SURG 17X11 SM STRL (DRAPES) ×1 IMPLANT
DRAPE SURG ORHT 6 SPLT 77X108 (DRAPES) ×2 IMPLANT
DRSG AQUACEL AG ADV 3.5X10 (GAUZE/BANDAGES/DRESSINGS) IMPLANT
DRSG OPSITE POSTOP 4X10 (GAUZE/BANDAGES/DRESSINGS) ×1 IMPLANT
DURAPREP 26ML APPLICATOR (WOUND CARE) ×4 IMPLANT
ELECT CAUTERY BLADE 6.4 (BLADE) ×1 IMPLANT
ELECT REM PT RETURN 9FT ADLT (ELECTROSURGICAL) ×1
ELECTRODE REM PT RTRN 9FT ADLT (ELECTROSURGICAL) ×1 IMPLANT
GAUZE 4X4 16PLY ~~LOC~~+RFID DBL (SPONGE) ×1 IMPLANT
GAUZE XEROFORM 1X8 LF (GAUZE/BANDAGES/DRESSINGS) ×2 IMPLANT
GLOVE BIOGEL PI IND STRL 9 (GLOVE) ×1 IMPLANT
GLOVE BIOGEL PI ORTHO SZ9 (GLOVE) ×4 IMPLANT
GOWN STRL REUS TWL 2XL XL LVL4 (GOWN DISPOSABLE) ×1 IMPLANT
GOWN STRL REUS W/ TWL LRG LVL3 (GOWN DISPOSABLE) ×1 IMPLANT
GOWN STRL REUS W/TWL LRG LVL3 (GOWN DISPOSABLE) ×1
HEAD MODULAR ENDO (Orthopedic Implant) ×1 IMPLANT
HEAD UNPLR 42XMDLR STRL HIP (Orthopedic Implant) IMPLANT
HOLSTER ELECTROSUGICAL PENCIL (MISCELLANEOUS) ×1 IMPLANT
IV NS IRRIG 3000ML ARTHROMATIC (IV SOLUTION) ×1 IMPLANT
KIT TURNOVER KIT A (KITS) ×1 IMPLANT
MANIFOLD NEPTUNE II (INSTRUMENTS) ×1 IMPLANT
NDL FILTER BLUNT 18X1 1/2 (NEEDLE) ×1 IMPLANT
NDL MAYO CATGUT SZ4 TPR NDL (NEEDLE) ×1 IMPLANT
NDL SAFETY ECLIP 18X1.5 (MISCELLANEOUS) ×1 IMPLANT
NEEDLE FILTER BLUNT 18X1 1/2 (NEEDLE) ×1 IMPLANT
NEEDLE MAYO CATGUT SZ4 (NEEDLE) ×1 IMPLANT
NS IRRIG 1000ML POUR BTL (IV SOLUTION) ×1 IMPLANT
PACK HIP PROSTHESIS (MISCELLANEOUS) ×1 IMPLANT
PILLOW ABDUCTION FOAM SM (MISCELLANEOUS) ×1 IMPLANT
PULSAVAC PLUS IRRIG FAN TIP (DISPOSABLE) ×1
SLEEVE UNITRAX V40 STD (Orthopedic Implant) IMPLANT
SPONGE T-LAP 18X18 ~~LOC~~+RFID (SPONGE) ×4 IMPLANT
STAPLER SKIN PROX 35W (STAPLE) ×1 IMPLANT
STEM FEM ACCOLADE 38X102X30 S3 (Stem) IMPLANT
SUT TICRON 2-0 30IN 311381 (SUTURE) ×4 IMPLANT
SUT VIC AB 0 CT1 36 (SUTURE) ×1 IMPLANT
SUT VIC AB 2-0 CT2 27 (SUTURE) ×2 IMPLANT
SYR 10ML LL (SYRINGE) ×1 IMPLANT
TIP FAN IRRIG PULSAVAC PLUS (DISPOSABLE) ×1 IMPLANT
TRAP FLUID SMOKE EVACUATOR (MISCELLANEOUS) ×1 IMPLANT
WATER STERILE IRR 500ML POUR (IV SOLUTION) ×1 IMPLANT

## 2022-08-26 NOTE — Plan of Care (Signed)
  Problem: Education: Goal: Knowledge of General Education information will improve Description: Including pain rating scale, medication(s)/side effects and non-pharmacologic comfort measures Outcome: Progressing   Problem: Clinical Measurements: Goal: Cardiovascular complication will be avoided Outcome: Progressing   Problem: Coping: Goal: Level of anxiety will decrease Outcome: Progressing   Problem: Pain Managment: Goal: General experience of comfort will improve Outcome: Progressing   Problem: Education: Goal: Knowledge of General Education information will improve Description: Including pain rating scale, medication(s)/side effects and non-pharmacologic comfort measures Outcome: Progressing   Problem: Elimination: Goal: Will not experience complications related to bowel motility Outcome: Progressing   Problem: Pain Managment: Goal: General experience of comfort will improve Outcome: Progressing

## 2022-08-26 NOTE — Progress Notes (Signed)
Initial Nutrition Assessment RD working remotely.   DOCUMENTATION CODES:   Not applicable  INTERVENTION:  - will order Ensure Plus High Protein po BID, each supplement provides 350 kcal and 20 grams of protein.  - complete NFPE when feasible.   NUTRITION DIAGNOSIS:   Increased nutrient needs related to hip fracture, post-op healing as evidenced by estimated needs.  GOAL:   Patient will meet greater than or equal to 90% of their needs  MONITOR:   PO intake, Supplement acceptance, Labs, Weight trends  REASON FOR ASSESSMENT:   Consult Hip fracture protocol  ASSESSMENT:   81 year old female with HTN, dementia, anxiety, depression, and HLD. She presented to the ED after a fall with subsequent R hip pain. In the ED it was noted that she had a mildly displaced R femoral neck fracture. Patient admitted with plan for surgical intervention.  Patient is currently in the OR related to R hip fracture.   Diet changed from Heart Healthy to NPO at midnight. No meal completion documentation from lunch or dinner yesterday available in the flow sheet.   She has not been seen by a [place] RD since 02/19/20.  Weight yesterday was documented as 130 lb; though this appears to possibly be a stated weight. Weight has been stable sine 02/16/20.   Labs reviewed. Medications reviewed.    NUTRITION - FOCUSED PHYSICAL EXAM:  RD working remotely.  Diet Order:   Diet Order             Diet NPO time specified Except for: Sips with Meds  Diet effective midnight                   EDUCATION NEEDS:   No education needs have been identified at this time  Skin:  Skin Assessment: Reviewed RN Assessment  Last BM:  PTA/unknown  Height:   Ht Readings from Last 1 Encounters:  08/25/22 '5\' 2"'$  (1.575 m)    Weight:   Wt Readings from Last 1 Encounters:  08/25/22 59 kg     BMI:  Body mass index is 23.78 kg/m.  Estimated Nutritional Needs:  Kcal:  1600-1800 kcal Protein:   80-90 grams Fluid:  >/= 1.8 L/day      Jarome Matin, MS, RD, LDN, CNSC Clinical Dietitian PRN/Relief staff On-call/weekend pager # available in [facility]

## 2022-08-26 NOTE — Progress Notes (Signed)
[facility] NAME: Mallory Peters    MR#:  [id number]  DATE OF BIRTH:  [birth year]  SUBJECTIVE:  patient seen in PACU earlier. Daughter at bedside. Has baseline dementia lives with daughter at home had mechanical fall and could not move. Brought to the emergency room found to have right displaced femoral neck fracture. She is seen by Dr. Mack Guise plans for surgery.   VITALS:  Blood pressure 97/65, pulse 94, temperature 97.9 F (36.6 C), resp. rate 16, height '5\' 2"'$  (1.575 m), weight 59 kg, SpO2 93 %.  PHYSICAL EXAMINATION:   GENERAL:  81 y.o.-year-old patient lying in the bed with no acute distress.  LUNGS: Normal breath sounds bilaterally, no wheezing CARDIOVASCULAR: S1, S2 normal. No murmurs,   ABDOMEN: Soft, nontender, nondistended. Bowel sounds present.  EXTREMITIES: No  edema b/l.    NEUROLOGIC: nonfocal  patient is sleepy. Denies dementia at baseline SKIN: No obvious rash, lesion, or ulcer.   LABORATORY PANEL:  CBC Recent Labs  Lab 08/26/22 1207  WBC 13.3*  HGB 12.5  HCT 37.3  PLT 215    Chemistries  Recent Labs  Lab 08/25/22 1041 08/26/22 0548  NA  --  138  K  --  3.5  CL  --  106  CO2  --  25  GLUCOSE  --  125*  BUN  --  10  CREATININE  --  0.70  CALCIUM  --  9.4  AST 20  --   ALT 15  --   ALKPHOS 64  --   BILITOT 0.8  --    Cardiac Enzymes No results for input(s): "TROPONINI" in the last 168 hours. RADIOLOGY:  Chest Portable 1 View  Result Date: 08/25/2022 CLINICAL DATA:  Dementia, post fall. EXAM: PORTABLE CHEST 1 VIEW COMPARISON:  August 25, 2022 CT of the chest, abdomen and pelvis FINDINGS: EKG leads project over the chest. Cardiomediastinal contours and hilar structures are stable. Lungs are clear.  No pneumothorax. Mild curvature of the spine with levo convexity at the thoracolumbar junction. No acute skeletal process on limited assessment. IMPRESSION: No acute cardiopulmonary disease.  Electronically Signed   By: Zetta Bills M.D.   On: 08/25/2022 13:40   CT CHEST ABDOMEN PELVIS W CONTRAST  Result Date: 08/25/2022 CLINICAL DATA:  Unwitnessed fall. Pain with right hip and leg movement. Additional non localized pain. EXAM: CT CHEST, ABDOMEN, AND PELVIS WITH CONTRAST TECHNIQUE: Multidetector CT imaging of the chest, abdomen and pelvis was performed following the standard protocol during bolus administration of intravenous contrast. RADIATION DOSE REDUCTION: This exam was performed according to the departmental dose-optimization program which includes automated exposure control, adjustment of the mA and/or kV according to patient size and/or use of iterative reconstruction technique. CONTRAST:  176m OMNIPAQUE IOHEXOL 300 MG/ML  SOLN COMPARISON:  Right hip radiographs obtained today. Portable chest dated 02/18/2020. Abdomen and pelvis CT dated 10/29/2012. FINDINGS: CT CHEST FINDINGS Cardiovascular: Mild aortic atheromatous changes. Borderline enlarged heart. Mediastinum/Nodes: No enlarged mediastinal, hilar, or axillary lymph nodes. Thyroid gland, trachea, and esophagus demonstrate no significant findings. Lungs/Pleura: Bilateral dependent atelectasis. Minimal centrilobular bullous changes. No pleural fluid or pneumothorax. Musculoskeletal: Mild thoracolumbar scoliosis. Thoracic spine degenerative changes. Severe left glenohumeral joint degenerative changes. No fracture or subluxation. CT ABDOMEN PELVIS FINDINGS Hepatobiliary: Multiple simple liver cysts are again demonstrated. These do not need imaging follow-up. Cholecystectomy clips with expected post cholecystectomy intrahepatic biliary ductal dilatation. Pancreas: Unremarkable. No pancreatic ductal dilatation or surrounding  inflammatory changes. Spleen: Normal in size without focal abnormality. Adrenals/Urinary Tract: Normal-appearing adrenal glands. Tiny bilateral renal cysts without significant change. Unremarkable ureters and urinary  bladder. Stomach/Bowel: Mild sigmoid and descending colon diverticulosis without evidence of diverticulitis. Unremarkable stomach, small bowel and appendix. Vascular/Lymphatic: Atheromatous arterial calcifications without aneurysm. No enlarged lymph nodes. Reproductive: Uterus and left adnexa are unremarkable. The previously demonstrated 0.9 cm right ovarian cyst has peripheral wall calcifications and medium density and low-density components internally with an appearance suggesting a small dermoid. This currently measures 2.4 cm in maximum diameter previously measured 2.2 cm in corresponding diameter. Other: No abdominal wall hernia or abnormality. No abdominopelvic ascites. Musculoskeletal: Right femoral neck fracture with mild anterior and proximal displacement of the distal fragment. Lumbar spine degenerative changes. No lumbar spine fractures or subluxations. IMPRESSION: 1. Right femoral neck fracture with mild anterior and proximal displacement of the distal fragment. 2. No acute abnormality in the chest, abdomen or pelvis. 3. Mild changes of COPD. 4. Mild sigmoid and descending colon diverticulosis. 5. 2.4 cm right ovarian dermoid with little change since 2014. This does not need imaging follow-up. Aortic Atherosclerosis (ICD10-I70.0). Electronically Signed   By: Claudie Revering M.D.   On: 08/25/2022 12:03   CT Head Wo Contrast  Result Date: 08/25/2022 CLINICAL DATA:  Non localized pain following an unwitnessed fall. EXAM: CT HEAD WITHOUT CONTRAST CT CERVICAL SPINE WITHOUT CONTRAST TECHNIQUE: Multidetector CT imaging of the head and cervical spine was performed following the standard protocol without intravenous contrast. Multiplanar CT image reconstructions of the cervical spine were also generated. RADIATION DOSE REDUCTION: This exam was performed according to the departmental dose-optimization program which includes automated exposure control, adjustment of the mA and/or kV according to patient size  and/or use of iterative reconstruction technique. COMPARISON:  Head CT dated 04/11/2022 FINDINGS: CT HEAD FINDINGS Brain: Stable moderately enlarged ventricles and subarachnoid spaces. Stable moderate patchy white matter low density in both cerebral hemispheres. Stable densely calcified meningioma in the left lateral frontoparietal region. This currently measures 1.5 cm in maximum diameter, previously 1.4 cm in corresponding diameter. No intracranial hemorrhage, new mass lesion or CT evidence of acute infarction. Vascular: No hyperdense vessel or unexpected calcification. Skull: Normal. Negative for fracture or focal lesion. Sinuses/Orbits: Unremarkable. Other: None. CT CERVICAL SPINE FINDINGS Alignment: Normal. Skull base and vertebrae: No acute fracture. No primary bone lesion or focal pathologic process. Soft tissues and spinal canal: No prevertebral fluid or swelling. No visible canal hematoma. Disc levels:  Multilevel degenerative changes. Upper chest: Clear lung apices. Other: Minimal bilateral carotid artery calcification. IMPRESSION: 1. No skull fracture or intracranial hemorrhage. 2. No cervical spine fracture or subluxation. 3. Stable moderate diffuse cerebral and cerebellar atrophy and moderate chronic small vessel white matter ischemic changes in both cerebral hemispheres. 4. Stable densely calcified meningioma in the left lateral frontoparietal region. 5. Multilevel cervical spine degenerative changes. 6. Minimal bilateral carotid artery atheromatous calcification. Electronically Signed   By: Claudie Revering M.D.   On: 08/25/2022 11:43   CT Cervical Spine Wo Contrast  Result Date: 08/25/2022 CLINICAL DATA:  Non localized pain following an unwitnessed fall. EXAM: CT HEAD WITHOUT CONTRAST CT CERVICAL SPINE WITHOUT CONTRAST TECHNIQUE: Multidetector CT imaging of the head and cervical spine was performed following the standard protocol without intravenous contrast. Multiplanar CT image reconstructions of  the cervical spine were also generated. RADIATION DOSE REDUCTION: This exam was performed according to the departmental dose-optimization program which includes automated exposure control, adjustment of the mA and/or kV  according to patient size and/or use of iterative reconstruction technique. COMPARISON:  Head CT dated 04/11/2022 FINDINGS: CT HEAD FINDINGS Brain: Stable moderately enlarged ventricles and subarachnoid spaces. Stable moderate patchy white matter low density in both cerebral hemispheres. Stable densely calcified meningioma in the left lateral frontoparietal region. This currently measures 1.5 cm in maximum diameter, previously 1.4 cm in corresponding diameter. No intracranial hemorrhage, new mass lesion or CT evidence of acute infarction. Vascular: No hyperdense vessel or unexpected calcification. Skull: Normal. Negative for fracture or focal lesion. Sinuses/Orbits: Unremarkable. Other: None. CT CERVICAL SPINE FINDINGS Alignment: Normal. Skull base and vertebrae: No acute fracture. No primary bone lesion or focal pathologic process. Soft tissues and spinal canal: No prevertebral fluid or swelling. No visible canal hematoma. Disc levels:  Multilevel degenerative changes. Upper chest: Clear lung apices. Other: Minimal bilateral carotid artery calcification. IMPRESSION: 1. No skull fracture or intracranial hemorrhage. 2. No cervical spine fracture or subluxation. 3. Stable moderate diffuse cerebral and cerebellar atrophy and moderate chronic small vessel white matter ischemic changes in both cerebral hemispheres. 4. Stable densely calcified meningioma in the left lateral frontoparietal region. 5. Multilevel cervical spine degenerative changes. 6. Minimal bilateral carotid artery atheromatous calcification. Electronically Signed   By: Claudie Revering M.D.   On: 08/25/2022 11:43   DG Hip Unilat W or Wo Pelvis 2-3 Views Right  Result Date: 08/25/2022 CLINICAL DATA:  Fall.  Right hip pain. EXAM: DG HIP  (WITH OR WITHOUT PELVIS) 2-3V RIGHT COMPARISON:  None Available. FINDINGS: Mildly displaced right femoral neck fracture is seen. No other fractures identified. No evidence of dislocation. IMPRESSION: Mildly displaced right femoral neck fracture. Electronically Signed   By: Marlaine Hind M.D.   On: 08/25/2022 11:20    Assessment and Plan   Kriste Broman is a 81 year old female with hypertension, dementia, anxiety, depression, hyperlipidemia, who presents emergency department for chief concerns of unwitnessed fall and right hip pain.    Right hip pelvis x-ray 2-3 view: Was read as mildly displaced right femoral neck fracture.   CT head without contrast and CT cervical spine without contrast: Was read as no skull fracture or intracranial hemorrhage.  No cervical spine fracture or subluxation.  Stable mild diffuse cerebral and cerebellar atrophy and moderate chronic small vessel white matter ischemic changes in both cerebral hemispheres.  Stable densely calcified meningioma in the left lateral frontoparietal region.   CT chest abdomen pelvis with contrast: No acute abnormality in the chest, abdomen or pelvis.  Right femoral neck fracture with mild anterior and proximal displacement of the distal fragment.  Mild changes of COPD.  Mild sigmoid and descending: Diverticulosis.  2.4 cm right ovarian dermoid with little changes since 2014.  Closed displaced fracture of left femoral neck ([place]) - Fall precautions -- Orthopedic service has been consulted, Dr. Mack Guise - patient is status post Right hemi arthroplasty -PT OT and TOC for discharge planning -- PRN pain meds.  -HLD (hyperlipidemia) - Rosuvastatin 10 mg nightly resumed   Depression With anxiety - continue Zoloft and Xanax ty   Meningioma ([place]) - Appears calcified on CT imaging - Chronic diastolic CHF (congestive heart failure) ([place]) - Echo on 02/17/2020: read as estimated ejection fraction 40 to 18%, grade 1 diastolic dysfunction    Seizures (HCC) - Resumed home Keppra  - Lorazepam 1 mg IV as needed for seizure    Dementia of the Alzheimer's type ([place]) - Home donepezil 10 mg nightly resume    Procedures: right hemiarthroplasty Family communication : daughter  Consults : Dr. Mack Guise CODE STATUS: DNR discussed with daughter DVT Prophylaxis : Level of care: Med-Surg Status is: Inpatient Remains inpatient appropriate because: s/p right hip fracture surgery PT/OT and toc for d/c planning    TOTAL TIME TAKING CARE OF THIS PATIENT: 35 minutes.  >50% time spent on counselling and coordination of care  Note: This dictation was prepared with Dragon dictation along with smaller phrase technology. Any transcriptional errors that result from this process are unintentional.  Fritzi Mandes M.D    [facility]   CC: Primary care physician; Idelle Crouch, MD

## 2022-08-26 NOTE — Op Note (Signed)
08/26/2022  11:14 AM  PATIENT:  Mallory Peters   MRN: [id number]  PRE-OPERATIVE DIAGNOSIS:  Right femoral neck hip fracture  POST-OPERATIVE DIAGNOSIS:  Same  PROCEDURE:  Procedure(s): Right hip hemiarthroplasty   PREOPERATIVE INDICATIONS:    Mallory Peters is an 81 y.o. female with a history of dementia and stroke who was admitted with a diagnosis of right femoral neck hip fracture.  I have recommended surgical treatment for her fracture with right hip hemiarthroplasty. I have explained the surgery and the postoperative course to the patient and their daughter who have agreed with surgical management of this fracture.  Patient's daughter is the power of attorney.  The risks benefits and alternatives were discussed with the patient and their family including but not limited to the risks of  infection requiring removal of the prosthesis, bleeding requiring blood transfusion, nerve injury especially to the sciatic nerve leading to foot drop or lower extremity numbness, periprosthetic fracture, dislocation leg length discrepancy, change in lower extremity rotation persistent hip pain, loosening or failure of the components and the need for revision surgery. Medical risks include but are not limited to DVT and pulmonary embolism, myocardial infarction, stroke, pneumonia, respiratory failure and death.  OPERATIVE REPORT     SURGEON:  Thornton Park, MD    ASSISTANT:  Surgical tech    ANESTHESIA:  general    COMPLICATIONS:  None.   EBL:  300 cc  SPECIMEN: Femoral head to pathology    COMPONENTS:  Stryker Accolade 2 femoral component size 3 with a 42 mm +0 neck adjustment sleeve.    PROCEDURE IN DETAIL:   The patient was met in the holding area and  identified.  The appropriate hip was identified and marked at the operative site after verbally confirming with the patient that this was the correct site of surgery.  The patient was then transported to the OR  and underwent  general anesthesia as she presented on plavix.  The patient was then placed in the lateral decubitus position with the operative side up and secured on the operating room table with a pegboard and all bony prominences were adequately padded. This included an axillary roll and additional padding around the nonoperative leg to prevent compression to the common peroneal nerve.    The operative lower extremity was prepped and draped in a sterile fashion.  A time out was performed prior to incision to verify patient's name, date of birth, medical record number, correct site of surgery correct procedure to be performed. The timeout was also used to verify the patient received antibiotics now appropriate instruments, implants and radiographic studies were available in the room. Once all in attendance were in agreement case began.    A posterolateral approach was utilized via sharp dissection  carried down to the subcutaneous tissue.  Bleeding vessels were coagulated using electrocautery.  The fascia lata was identified and incised along the length of the skin incision.  The gluteus maximus muscle was then split in line with its fibers. Self-retaining retractors were  inserted.  With the hip internally rotated, the short external rotators  were identified and removed from the posterior attachment from the greater trochanter. The piriformis was tagged for later repair. The capsule was identified and a T-shaped capsulotomy was performed. The capsule was tagged with #2 Tycron for later repair.  The femoral neck fracture was exposed, and the femoral head was removed using a corkscrew device. This was measured to be 42 mm in diameter. The attention  was then turned to proximal femur preparation.  An oscillating saw was used to perform a proximal femoral osteotomy one fingerbreadth above the lesser trochanter. The trial 42 mm femoral head was placed into the acetabulum and had an excellent suction fit. The attention was then  turned back to femoral preparation.    A femoral skid and Cobra retractor were placed under the femoral neck to allow for adequate visualization. A box osteotome was used to make the initial entry into the proximal femur. A single hand reamer was used to prepare the femoral canal. A T-shaped femoral canal sounder was then used to ensure no penetration femoral cortex had occurred during reaming. The proximal femur was then sequentially broached by hand. A size 3 femoral trial broach was found to have best medial to lateral canal fit. Once adequate mediolateral canal fill was achieved the trial femoral broach, neck, and head was assembled and the hip was reduced. It was found to have excellent stability, equivalent leg lengths with functional range of motion. The trial components were then removed.  I copiously irrigated the femoral canal and then impacted the actual Stryker Accolade 2 size 3 femoral prosthesis into place into the appropriate version, slightly anteverted to the normal anatomy, and I impacted the actual 42 mm Unitrax femoral component with a +0 neck adjustment sleeve into place. The hip was then reduced and taken through functional range of motion and found to have excellent stability. Leg lengths were restored. The hip joint was copiously irrigated.   A soft tissue repair of the capsule and external rotators was performed using #2 Tycron Excellent posterior capsular repair was achieved. The fascia lata was then closed with interrupted 0 Vicryl suture. The subcutaneous tissues were closed with 2-0 Vicryl and the skin approximated with staples.   The patient was then placed supine on the operative table. Leg lengths were checked clinically and found to be equivalent. An abduction pillow was placed between the lower extremities. The patient was then transferred to a hospital bed and brought to the PACU in stable condition. I was scrubbed and present the entire case and all sharp and instrument  counts were correct at the conclusion of the case. I spoke with the patient's daughter in the postop consultation room to let her know the case was completed without complication patient was stable in recovery room.   Timoteo Gaul, MD Orthopedic Surgeon

## 2022-08-26 NOTE — Anesthesia Procedure Notes (Signed)
Procedure Name: Intubation Date/Time: 08/26/2022 8:53 AM  Performed by: Aline Brochure, CRNAPre-anesthesia Checklist: Patient identified, Patient being monitored, Timeout performed, Emergency Drugs available and Suction available Patient Re-evaluated:Patient Re-evaluated prior to induction Oxygen Delivery Method: Circle system utilized Preoxygenation: Pre-oxygenation with 100% oxygen Induction Type: IV induction Ventilation: Mask ventilation without difficulty Laryngoscope Size: 3 and McGraph Grade View: Grade I Tube type: Oral Tube size: 7.0 mm Number of attempts: 1 Airway Equipment and Method: Stylet and Video-laryngoscopy Placement Confirmation: ETT inserted through vocal cords under direct vision, positive ETCO2 and breath sounds checked- equal and bilateral Secured at: 20 cm Tube secured with: Tape Dental Injury: Teeth and Oropharynx as per pre-operative assessment

## 2022-08-26 NOTE — Transfer of Care (Signed)
Immediate Anesthesia Transfer of Care Note  Patient: Mallory Peters  Procedure(s) Performed: ARTHROPLASTY BIPOLAR HIP (HEMIARTHROPLASTY) (Right: Hip)  Patient Location: PACU  Anesthesia Type:General  Level of Consciousness: awake, alert , and oriented  Airway & Oxygen Therapy: Patient Spontanous Breathing and Patient connected to face mask oxygen  Post-op Assessment: Report given to RN and Post -op Vital signs reviewed and stable  Post vital signs: Reviewed and stable  Last Vitals:  Vitals Value Taken Time  BP 108/59   Temp 97.32f  Pulse 86   Resp 19   SpO2 100     Last Pain:  Vitals:   08/25/22 2115  TempSrc:   PainSc: 0-No pain         Complications: No notable events documented.

## 2022-08-26 NOTE — Anesthesia Preprocedure Evaluation (Signed)
Anesthesia Evaluation  Patient identified by MRN, date of birth, ID band Patient awake    Reviewed: Allergy & Precautions, H&P , NPO status , Patient's Chart, lab work & pertinent test results, reviewed documented beta blocker date and time   Airway Mallampati: II   Neck ROM: full    Dental  (+) Poor Dentition   Pulmonary shortness of breath and with exertion   Pulmonary exam normal        Cardiovascular Exercise Tolerance: Poor hypertension, On Medications + angina with exertion +CHF  Normal cardiovascular exam Rhythm:regular Rate:Normal     Neuro/Psych Seizures -,   Anxiety Depression   Dementia CVA  negative psych ROS   GI/Hepatic Neg liver ROS,GERD  Medicated,,  Endo/Other  negative endocrine ROS    Renal/GU negative Renal ROS  negative genitourinary   Musculoskeletal   Abdominal   Peds  Hematology negative hematology ROS (+)   Anesthesia Other Findings Past Medical History: No date: Anginal pain ([place]) No date: Anxiety No date: B12 deficiency No date: Colon polyps No date: CVD (cardiovascular disease) No date: Dementia (HCC) No date: Depression No date: Dysphagia No date: Dyspnea No date: Esophageal dysmotility No date: Gastritis No date: GERD (gastroesophageal reflux disease) No date: Hemorrhoids No date: HNP (herniated nucleus pulposus), lumbar No date: Hyperlipidemia No date: Hypertension No date: Insomnia No date: Lipids blood increased No date: Meningioma (HCC) No date: Non-cardiac chest pain No date: Osteoarthritis No date: Polyp of cervix No date: Stroke (cerebrum) [facility]) Past Surgical History: No date: BIOPSY SHOULDER No date: CHOLECYSTECTOMY No date: colonic polyps No date: COLONOSCOPY 04/28/2018: COLONOSCOPY WITH PROPOFOL; N/A     Comment:  Procedure: COLONOSCOPY WITH PROPOFOL;  Surgeon: Manya Silvas, MD;  Location: [facility] ENDOSCOPY;  Service:               Endoscopy;   Laterality: N/A; 07/12/2016: ESOPHAGOGASTRODUODENOSCOPY (EGD) WITH PROPOFOL; N/A     Comment:  Procedure: ESOPHAGOGASTRODUODENOSCOPY (EGD) WITH               PROPOFOL;  Surgeon: Manya Silvas, MD;  Location[facility]              ENDOSCOPY;  Service: Endoscopy;  Laterality: N/A; 07/12/2016: SAVORY DILATION; N/A     Comment:  Procedure: SAVORY DILATION;  Surgeon: Manya Silvas,               MD;  Location: [facility] ENDOSCOPY;  Service: Endoscopy;                Laterality: N/A; No date: TUBAL LIGATION BMI    Body Mass Index: 23.78 kg/m     Reproductive/Obstetrics negative OB ROS                             Anesthesia Physical Anesthesia Plan  ASA: 3 and emergent  Anesthesia Plan: General ETT   Post-op Pain Management:    Induction:   PONV Risk Score and Plan: 3  Airway Management Planned:   Additional Equipment:   Intra-op Plan:   Post-operative Plan:   Informed Consent: I have reviewed the patients History and Physical, chart, labs and discussed the procedure including the risks, benefits and alternatives for the proposed anesthesia with the patient or authorized representative who has indicated his/her understanding and acceptance.     Dental Advisory Given  Plan Discussed with: CRNA  Anesthesia Plan Comments:        Anesthesia Quick Evaluation

## 2022-08-26 NOTE — Anesthesia Postprocedure Evaluation (Signed)
Anesthesia Post Note  Patient: Mallory Peters  Procedure(s) Performed: ARTHROPLASTY BIPOLAR HIP (HEMIARTHROPLASTY) (Right: Hip)  Patient location during evaluation: PACU Anesthesia Type: General Level of consciousness: awake and alert Pain management: pain level controlled Vital Signs Assessment: post-procedure vital signs reviewed and stable Respiratory status: spontaneous breathing, nonlabored ventilation, respiratory function stable and patient connected to nasal cannula oxygen Cardiovascular status: blood pressure returned to baseline and stable Postop Assessment: no apparent nausea or vomiting Anesthetic complications: no   No notable events documented.   Last Vitals:  Vitals:   08/26/22 0027 08/26/22 0736  BP: 116/66 132/72  Pulse: 77 80  Resp: 16 16  Temp:  36.9 C  SpO2: 94% 96%    Last Pain:  Vitals:   08/25/22 2115  TempSrc:   PainSc: 0-No pain                 Molli Barrows

## 2022-08-27 ENCOUNTER — Encounter: Payer: Self-pay | Admitting: Orthopedic Surgery

## 2022-08-27 DIAGNOSIS — S72002A Fracture of unspecified part of neck of left femur, initial encounter for closed fracture: Secondary | ICD-10-CM | POA: Diagnosis not present

## 2022-08-27 LAB — CBC
HCT: 28.5 % — ABNORMAL LOW (ref 36.0–46.0)
Hemoglobin: 9.7 g/dL — ABNORMAL LOW (ref 12.0–15.0)
MCH: 28.7 pg (ref 26.0–34.0)
MCHC: 34 g/dL (ref 30.0–36.0)
MCV: 84.3 fL (ref 80.0–100.0)
Platelets: 149 10*3/uL — ABNORMAL LOW (ref 150–400)
RBC: 3.38 MIL/uL — ABNORMAL LOW (ref 3.87–5.11)
RDW: 12.6 % (ref 11.5–15.5)
WBC: 10.3 10*3/uL (ref 4.0–10.5)
nRBC: 0 % (ref 0.0–0.2)

## 2022-08-27 LAB — BASIC METABOLIC PANEL
Anion gap: 5 (ref 5–15)
BUN: 12 mg/dL (ref 8–23)
CO2: 25 mmol/L (ref 22–32)
Calcium: 9 mg/dL (ref 8.9–10.3)
Chloride: 108 mmol/L (ref 98–111)
Creatinine, Ser: 0.64 mg/dL (ref 0.44–1.00)
GFR, Estimated: >60 mL/min (ref >60)
Glucose, Bld: 122 mg/dL — ABNORMAL HIGH (ref 70–99)
Potassium: 3.8 mmol/L (ref 3.5–5.1)
Sodium: 138 mmol/L (ref 135–145)

## 2022-08-27 NOTE — Evaluation (Signed)
Physical Therapy Evaluation Patient Details Name: Mallory Peters MRN: [id number] DOB: 1941/04/27 Today's Date: 08/27/2022  History of Present Illness  Patient is a 81 year old female with history of dementia and stroke who had an unwitnessed falls and was admitted with right femoral neck hip fracture. S/p right hip hemiarthroplasty  Clinical Impression  Patient is seen with daughter at the bedside. Her daughter is her caregiver at home and assistance with mobility and ADLs as needed at baseline. The patient is usually ambulatory in the home without assistive device with increased shuffled gait in the past several weeks. She does require at least set-up/supervision for ADLs and needs assistance for meals and medication at baseline.  Today, the patient is oriented to person, place, situation. She is able to follow single step commands consistently with increased time. Limited overall activity tolerance and some pain reported with movement of the right leg. She required one person assistance to get to the edge of the bed. Encouragement required to remain sitting up on the side of the bed and physical assistance was required to maintain upright sitting balance. She fatigues quickly with activity and standing was not attempted. She required increased assistance for returning to bed due to fatigue. Hip precautions and exercises were reviewed with the caregiver with a handout provided.  The patient needs continued PT to maximize independence and decrease caregiver burden. Patient could certainly benefit from short term rehab placement, however the daughter is considering bringing her home with family assistance.      Recommendations for follow up therapy are one component of a multi-disciplinary discharge planning process, led by the attending physician.  Recommendations may be updated based on patient status, additional functional criteria and insurance authorization.  Follow Up Recommendations Skilled  nursing-short term rehab (<3 hours/day) Can patient physically be transported by private vehicle: No    Assistance Recommended at Discharge Frequent or constant Supervision/Assistance  Patient can return home with the following  Two people to help with walking and/or transfers;A lot of help with bathing/dressing/bathroom;Direct supervision/assist for medications management;Assistance with feeding;Assistance with cooking/housework;Assist for transportation;Help with stairs or ramp for entrance    Equipment Recommendations BSC/3in1;Hospital bed (if patient is going home)  Recommendations for Other Services       Functional Status Assessment Patient has had a recent decline in their functional status and demonstrates the ability to make significant improvements in function in a reasonable and predictable amount of time.     Precautions / Restrictions Precautions Precautions: Posterior Hip Precaution Booklet Issued: Yes (comment) Restrictions Weight Bearing Restrictions: Yes RLE Weight Bearing: Weight bearing as tolerated      Mobility  Bed Mobility Overal bed mobility: Needs Assistance Bed Mobility: Supine to Sit, Sit to Supine     Supine to sit: Max assist Sit to supine: Max assist, +2 for physical assistance   General bed mobility comments: assistance of one person required to sit up on the edge of the bed. verbal cues for task initiation and sequencing. increased assistance required for return to bed due to fatigue    Transfers                   General transfer comment: not attempted due to poor sitting tolerance, patient requesting to return to bed. encouragement needed to sit for several minutes on the edge of bed while nursing assisted patient with getting washed up    Ambulation/Gait  Stairs            Wheelchair Mobility    Modified Rankin (Stroke Patients Only)       Balance Overall balance assessment: Needs assistance,  History of Falls Sitting-balance support: Feet supported, Bilateral upper extremity supported Sitting balance-Leahy Scale: Poor Sitting balance - Comments: at least minimal assistance was required to maintain sitting balance with a posterior and right lean (patient likely trying to return to bed). dynamic balance is zero with increased posteior lean without UE supported on bed Postural control: Right lateral lean, Posterior lean                                   Pertinent Vitals/Pain Pain Assessment Pain Assessment: Faces Faces Pain Scale: Hurts little more Pain Location: R hip Pain Descriptors / Indicators: Guarding Pain Intervention(s): Limited activity within patient's tolerance, Monitored during session, Premedicated before session, Repositioned    Home Living Family/patient expects to be discharged to:: Private residence Living Arrangements: Children (daughter) Available Help at Discharge: Family;Available 24 hours/day Type of Home: House Home Access: Stairs to enter Entrance Stairs-Rails:  (one rail) Technical brewer of Steps: 1 (1 or 2 depending on the door used) Alternate Level Stairs-Number of Steps: has a stair lift Home Layout: Two level;Bed/bath upstairs;1/2 bath on main level Home Equipment: Rollator (4 wheels);Cane - quad;Cane - single point;Shower seat;Transport chair (she does not have a 3-in-1)      Prior Function Prior Level of Function : Needs assist;History of Falls (last six months)             Mobility Comments: daughter will assist her up the steps to enter the home. patient is ambulatory with and without DME (cane and rollator) without physical assistance. around 2 falls in the past 6 months. patient has had declining mobility in the past 2 weeks with a shuffled gait ADLs Comments: daughter provides at least set-up/supervision assistance in the shower with verbal cues required secondary to dementia and fall risk. daughter manages  medication/meals     Hand Dominance        Extremity/Trunk Assessment   Upper Extremity Assessment Upper Extremity Assessment: Generalized weakness    Lower Extremity Assessment Lower Extremity Assessment: RLE deficits/detail (generalized weakness throughout) RLE Deficits / Details: pain with AROM. patient able to activate hip/knee/ankle movement       Communication   Communication: No difficulties  Cognition Arousal/Alertness: Awake/alert Behavior During Therapy: Flat affect Overall Cognitive Status: History of cognitive impairments - at baseline                                 General Comments: patient is oriented to her first name, birthdate, place, and situation (fall). she is able to follow single step commands consistently with extra time. she can tell me her daughter's name at the bedside        General Comments General comments (skin integrity, edema, etc.): while sitting on edge of bed, emphasis on maintaining midline sitting balance and encouragement provided for to increase sitting tolerance in preparation for transfer training    Exercises     Assessment/Plan    PT Assessment Patient needs continued PT services  PT Problem List Decreased strength;Decreased range of motion;Decreased activity tolerance;Decreased balance;Decreased mobility;Pain;Decreased knowledge of precautions;Decreased safety awareness;Decreased cognition       PT Treatment Interventions DME instruction;Gait training;Stair training;Functional  mobility training;Therapeutic activities;Therapeutic exercise;Balance training;Neuromuscular re-education;Cognitive remediation;Patient/family education;Wheelchair mobility training    PT Goals (Current goals can be found in the Care Plan section)  Acute Rehab PT Goals Patient Stated Goal: family goal is to go home eventually PT Goal Formulation: With family Time For Goal Achievement: 09/10/22 Potential to Achieve Goals: Fair     Frequency 7X/week     Co-evaluation               AM-PAC PT "6 Clicks" Mobility  Outcome Measure Help needed turning from your back to your side while in a flat bed without using bedrails?: A Lot Help needed moving from lying on your back to sitting on the side of a flat bed without using bedrails?: A Lot Help needed moving to and from a bed to a chair (including a wheelchair)?: Total Help needed standing up from a chair using your arms (e.g., wheelchair or bedside chair)?: Total Help needed to walk in hospital room?: Total Help needed climbing 3-5 steps with a railing? : Total 6 Click Score: 8    End of Session   Activity Tolerance: Patient limited by fatigue Patient left: in bed;with call bell/phone within reach;with bed alarm set;with family/visitor present;with SCD's reapplied (pillow between legs to maintain posterior hip precautions) Nurse Communication: Mobility status;Precautions PT Visit Diagnosis: Difficulty in walking, not elsewhere classified (R26.2);Muscle weakness (generalized) (M62.81);History of falling (Z91.81) Pain - Right/Left: Right Pain - part of body: Hip    Time: 9604-5409 PT Time Calculation (min) (ACUTE ONLY): 55 min   Charges:   PT Evaluation $PT Eval Low Complexity: 1 Low PT Treatments $Therapeutic Activity: 23-37 mins       Minna Merritts, PT, MPT  Percell Locus 08/27/2022, 10:06 AM

## 2022-08-27 NOTE — Progress Notes (Signed)
Subjective:  POD #1 s/p right hip hemiarthroplasty.   Patient reports right hip pain as mild.  Patient's daughter is at the bedside.   Patient has dementia at baseline and the patient's daughter's and she was confused this morning and had difficulty even drinking through a straw.  This has improved through the day.  Objective:   VITALS:   Vitals:   08/26/22 1516 08/26/22 2218 08/27/22 0724 08/27/22 1627  BP: 100/67 100/67 (!) 91/56 (!) 108/53  Pulse: 100 80 85 97  Resp: '16 16 20 16  '$ Temp: 98.4 F (36.9 C) 98.3 F (36.8 C) 98 F (36.7 C)   TempSrc:  Axillary    SpO2: 94% 95% 90% 94%  Weight:      Height:        PHYSICAL EXAM: Right lower extremity: Mild serosanguineous drainage on the bandage.  No evidence of active bleeding.  Patient's thigh is soft and compressible.  Distally she is neurovascular intact.  Patient is wearing an abduction pillow.   LABS  Results for orders placed or performed during the hospital encounter of 08/25/22 (from the past 24 hour(s))  CBC     Status: Abnormal   Collection Time: 08/27/22  5:18 AM  Result Value Ref Range   WBC 10.3 4.0 - 10.5 K/uL   RBC 3.38 (L) 3.87 - 5.11 MIL/uL   Hemoglobin 9.7 (L) 12.0 - 15.0 g/dL   HCT 28.5 (L) 36.0 - 46.0 %   MCV 84.3 80.0 - 100.0 fL   MCH 28.7 26.0 - 34.0 pg   MCHC 34.0 30.0 - 36.0 g/dL   RDW 12.6 11.5 - 15.5 %   Platelets 149 (L) 150 - 400 K/uL   nRBC 0.0 0.0 - 0.2 %  Basic metabolic panel     Status: Abnormal   Collection Time: 08/27/22  5:18 AM  Result Value Ref Range   Sodium 138 135 - 145 mmol/L   Potassium 3.8 3.5 - 5.1 mmol/L   Chloride 108 98 - 111 mmol/L   CO2 25 22 - 32 mmol/L   Glucose, Bld 122 (H) 70 - 99 mg/dL   BUN 12 8 - 23 mg/dL   Creatinine, Ser 0.64 0.44 - 1.00 mg/dL   Calcium 9.0 8.9 - 10.3 mg/dL   GFR, Estimated >60 >60 mL/min   Anion gap 5 5 - 15    DG Hip Port Unilat With Pelvis 1V Right  Result Date: 08/26/2022 CLINICAL DATA:  Hip fracture, postop EXAM: DG HIP (WITH OR  WITHOUT PELVIS) 1V PORT RIGHT COMPARISON:  08/25/2022 FINDINGS: Right hip hemiarthroplasty. No hardware or bony complicating feature. Normal AP alignment. IMPRESSION: Right hip hemiarthroplasty.  No complicating feature. Electronically Signed   By: Rolm Baptise M.D.   On: 08/26/2022 11:42    Assessment/Plan: 1 Day Post-Op   Principal Problem:   Closed displaced fracture of left femoral neck (HCC) Active Problems:   Dementia of the Alzheimer's type (HCC)   Seizures (HCC)   Chronic diastolic CHF (congestive heart failure) (HCC)   Meningioma (HCC)   Depression   HLD (hyperlipidemia)  Patient doing well postop.  I reviewed her postoperative x-rays after surgery yesterday evening and the hemiarthroplasty prosthesis appeared well positioned and the leg lengths equivalent.  No evidence of postop complication.  Patient's hemoglobin remains within acceptable limits.  Continue physical therapy.  Continue discharge planning.  Patient would benefit from a skilled nursing facility.  Patient has restarted her Plavix and aspirin.    Thornton Park ,  MD 08/27/2022, 5:14 PM

## 2022-08-27 NOTE — Plan of Care (Signed)
  Problem: Clinical Measurements: Goal: Will remain free from infection Outcome: Progressing   Problem: Clinical Measurements: Goal: Ability to maintain clinical measurements within normal limits will improve Outcome: Progressing   Problem: Clinical Measurements: Goal: Respiratory complications will improve Outcome: Progressing   Problem: Coping: Goal: Level of anxiety will decrease Outcome: Progressing   Problem: Pain Managment: Goal: General experience of comfort will improve Outcome: Progressing   Problem: Clinical Measurements: Goal: Ability to maintain clinical measurements within normal limits will improve Outcome: Progressing   Problem: Clinical Measurements: Goal: Respiratory complications will improve Outcome: Progressing

## 2022-08-27 NOTE — Plan of Care (Signed)
  Problem: Education: Goal: Knowledge of General Education information will improve Description: Including pain rating scale, medication(s)/side effects and non-pharmacologic comfort measures Outcome: Progressing   Problem: Health Behavior/Discharge Planning: Goal: Ability to manage health-related needs will improve Outcome: Progressing   Problem: Clinical Measurements: Goal: Ability to maintain clinical measurements within normal limits will improve Outcome: Progressing Goal: Will remain free from infection Outcome: Progressing Goal: Diagnostic test results will improve Outcome: Progressing Goal: Respiratory complications will improve Outcome: Progressing Goal: Cardiovascular complication will be avoided Outcome: Progressing   Problem: Activity: Goal: Risk for activity intolerance will decrease Outcome: Progressing   Problem: Nutrition: Goal: Adequate nutrition will be maintained Outcome: Progressing   Problem: Coping: Goal: Level of anxiety will decrease Outcome: Progressing   Problem: Elimination: Goal: Will not experience complications related to bowel motility Outcome: Progressing Goal: Will not experience complications related to urinary retention Outcome: Progressing   Problem: Pain Managment: Goal: General experience of comfort will improve Outcome: Progressing   Problem: Safety: Goal: Ability to remain free from injury will improve Outcome: Progressing   Problem: Skin Integrity: Goal: Risk for impaired skin integrity will decrease Outcome: Progressing   Problem: Education: Goal: Knowledge of General Education information will improve Description: Including pain rating scale, medication(s)/side effects and non-pharmacologic comfort measures Outcome: Progressing   Problem: Health Behavior/Discharge Planning: Goal: Ability to manage health-related needs will improve Outcome: Progressing   Problem: Clinical Measurements: Goal: Ability to maintain  clinical measurements within normal limits will improve Outcome: Progressing Goal: Will remain free from infection Outcome: Progressing Goal: Diagnostic test results will improve Outcome: Progressing Goal: Respiratory complications will improve Outcome: Progressing Goal: Cardiovascular complication will be avoided Outcome: Progressing   Problem: Activity: Goal: Risk for activity intolerance will decrease Outcome: Progressing   Problem: Nutrition: Goal: Adequate nutrition will be maintained Outcome: Progressing   Problem: Coping: Goal: Level of anxiety will decrease Outcome: Progressing   Problem: Elimination: Goal: Will not experience complications related to bowel motility Outcome: Progressing Goal: Will not experience complications related to urinary retention Outcome: Progressing   Problem: Pain Managment: Goal: General experience of comfort will improve Outcome: Progressing   Problem: Safety: Goal: Ability to remain free from injury will improve Outcome: Progressing   Problem: Skin Integrity: Goal: Risk for impaired skin integrity will decrease Outcome: Progressing   Problem: Education: Goal: Verbalization of understanding the information provided (i.e., activity precautions, restrictions, etc) will improve Outcome: Progressing Goal: Individualized Educational Video(s) Outcome: Progressing   Problem: Activity: Goal: Ability to ambulate and perform ADLs will improve Outcome: Progressing   Problem: Clinical Measurements: Goal: Postoperative complications will be avoided or minimized Outcome: Progressing   Problem: Self-Concept: Goal: Ability to maintain and perform role responsibilities to the fullest extent possible will improve Outcome: Progressing   Problem: Pain Management: Goal: Pain level will decrease Outcome: Progressing

## 2022-08-27 NOTE — Evaluation (Signed)
Occupational Therapy Evaluation Patient Details Name: Mallory Peters MRN: [id number] DOB: 1941-02-23 Today's Date: 08/27/2022   History of Present Illness Patient is a 81 year old female with history of dementia and stroke who had an unwitnessed falls and was admitted with right femoral neck hip fracture. S/p right hip hemiarthroplasty   Clinical Impression   Pt seen for OT evaluation this date. Pt;s sister and daughter present and dtr who is primary caregiver notes pt was generally independent in ADL but requiring set up/supv for showering for safety, and assist for IADL. Pt appears asleep, will open eyes to voice and cues during session, able to follow simple commands with multimodal cues and + time for processing. Pt/dtr eager to take pt home. Dtr reports ability to create space on 1st fl for pt to live until she is able to use the stair lift again. Pt required MAX A for rolling for pericare and linens change with increased pain noted with mobility attempts. Pt/dtr educated in role of OT, recommendations, bed mobility for ADL, and precautions. Pt will benefit from skilled OT services to maximize return to PLOF and minimize caregiver burden. Recommend SNF at this time given the extensive assist required and significant change from baseline. Daughter eager to take pt home from hospital. If she declines SNF, pt will benefit from a hospital bed and [facility] to optimize safety for caregiver to provide extensive assist for all aspects of ADL and mobility.       Recommendations for follow up therapy are one component of a multi-disciplinary discharge planning process, led by the attending physician.  Recommendations may be updated based on patient status, additional functional criteria and insurance authorization.   Follow Up Recommendations  Skilled nursing-short term rehab (<3 hours/day)    Assistance Recommended at Discharge Frequent or constant Supervision/Assistance  Patient can return home with  the following Two people to help with walking and/or transfers;A lot of help with bathing/dressing/bathroom;Assistance with cooking/housework;Assist for transportation;Help with stairs or ramp for entrance;Assistance with feeding;Direct supervision/assist for financial management;Direct supervision/assist for medications management    Functional Status Assessment  Patient has had a recent decline in their functional status and demonstrates the ability to make significant improvements in function in a reasonable and predictable amount of time.  Equipment Recommendations  BSC/3in1;Hospital bed    Recommendations for Other Services       Precautions / Restrictions Precautions Precautions: Posterior Hip Restrictions RLE Weight Bearing: Weight bearing as tolerated      Mobility Bed Mobility Overal bed mobility: Needs Assistance Bed Mobility: Rolling Rolling: Max assist         General bed mobility comments: MAX A rolling for bed level toileting and linens change    Transfers                   General transfer comment: unsafe to attempt at this time      Balance                                           ADL either performed or assessed with clinical judgement   ADL                                         General ADL Comments: Pt required MAX  A for bed level bathing, dressing, and toileting at time of evaluation. Anticipate +2 assist for ADL Transfer attempts     Vision         Perception     Praxis      Pertinent Vitals/Pain Pain Assessment Pain Assessment: Faces Faces Pain Scale: Hurts even more Pain Location: R hip Pain Descriptors / Indicators: Guarding, Grimacing Pain Intervention(s): Limited activity within patient's tolerance, Monitored during session, Premedicated before session, Repositioned     Hand Dominance     Extremity/Trunk Assessment Upper Extremity Assessment Upper Extremity Assessment:  Generalized weakness   Lower Extremity Assessment Lower Extremity Assessment: Generalized weakness;RLE deficits/detail RLE Deficits / Details: pain limited RLE: Unable to fully assess due to pain       Communication Communication Communication: No difficulties   Cognition Arousal/Alertness: Lethargic Behavior During Therapy: Flat affect Overall Cognitive Status: History of cognitive impairments - at baseline                                 General Comments: keeps her eyes closed for most of the session but does alert to name, follow simple commands with +time and cues     General Comments       Exercises Other Exercises Other Exercises: Pt/dtr educated in role of OT, recommendations, bed mobility for ADL, and precautions   Shoulder Instructions      Home Living Family/patient expects to be discharged to:: Private residence Living Arrangements: Children (daughter) Available Help at Discharge: Family;Available 24 hours/day Type of Home: House Home Access: Stairs to enter [employer] of Steps: 1+1 (1 or 2 depending on the door used) Entrance Stairs-Rails:  (one rail) Home Layout: Two level;Bed/bath upstairs;1/2 bath on main level Alternate Level Stairs-Number of Steps: has a stair lift   Bathroom Shower/Tub: Walk-in shower         Home Equipment: Rollator (4 wheels);Cane - quad;Cane - single point;Shower seat;Transport chair (she does not have a 3-in-1)          Prior Functioning/Environment Prior Level of Function : Needs assist;History of Falls (last six months)             Mobility Comments: daughter will assist her up the steps to enter the home. patient is ambulatory with and without DME (cane and rollator) without physical assistance. around 2 falls in the past 6 months. patient has had declining mobility in the past 2 weeks with a shuffled gait ADLs Comments: daughter provides at least set-up/supervision assistance in the shower  with verbal cues required secondary to dementia and fall risk. daughter manages medication/meals        OT Problem List: Decreased strength;Pain;Decreased range of motion;Decreased cognition;Decreased safety awareness;Impaired balance (sitting and/or standing);Decreased knowledge of use of DME or AE;Decreased activity tolerance;Decreased knowledge of precautions      OT Treatment/Interventions: Self-care/ADL training;Therapeutic exercise;Therapeutic activities;DME and/or AE instruction;Patient/family education;Cognitive remediation/compensation;Balance training    OT Goals(Current goals can be found in the care plan section) Acute Rehab OT Goals Patient Stated Goal: go home OT Goal Formulation: With patient/family Time For Goal Achievement: 08/28/22 Potential to Achieve Goals: Fair ADL Goals Pt Will Transfer to Toilet: stand pivot transfer;bedside commode (LRAD, maintaining precautions, with caregiver indep with assisting) Pt Will Perform Toileting - Clothing Manipulation and hygiene: sitting/lateral leans;with caregiver independent in assisting Additional ADL Goal #1: Pt will complete bed mobility with caregiver indep in assisting, while maintaining precautions throughout, 3/3 opportunities.  Additional ADL Goal #2: Pt will complete SPT with RW to/from EOB, w/c, and BSC with caregiver indep in assisting while maintaining precautions, 3/3 opportunities.  OT Frequency: Min 2X/week    Co-evaluation              AM-PAC OT "6 Clicks" Daily Activity     Outcome Measure Help from another person eating meals?: None Help from another person taking care of personal grooming?: A Little Help from another person toileting, which includes using toliet, bedpan, or urinal?: A Lot Help from another person bathing (including washing, rinsing, drying)?: A Lot Help from another person to put on and taking off regular upper body clothing?: A Lot Help from another person to put on and taking off  regular lower body clothing?: A Lot 6 Click Score: 15   End of Session    Activity Tolerance: Patient limited by pain Patient left: in bed;with call bell/phone within reach;with bed alarm set;with family/visitor present;Other (comment);with SCD's reapplied (wedge pillow in place)  OT Visit Diagnosis: Other abnormalities of gait and mobility (R26.89);Muscle weakness (generalized) (M62.81);Pain Pain - Right/Left: Right Pain - part of body: Hip                Time: 6948-5462 OT Time Calculation (min): 29 min Charges:  OT General Charges $OT Visit: 1 Visit OT Evaluation $OT Eval Moderate Complexity: 1 Mod OT Treatments $Self Care/Home Management : 8-22 mins  Ardeth Perfect., MPH, MS, OTR/L ascom [phone] 08/27/22, 4:10 PM

## 2022-08-27 NOTE — TOC Progression Note (Signed)
Transition of Care [facility]) - Progression Note    Patient Details  Name: Mallory Peters MRN: [id number] Date of Birth: [birth year]  Transition of Care [facility]) CM/SW [place], RN Phone Number: 08/27/2022, 4:38 PM  Clinical Narrative:    Spoke to the Daughter [name], She is the [facility] she stated she is not ready yet to agree to go to [facility] SNF, she stated that she would like to observe tomorrow to see how she does with PT or OT, she stated that the patient has had [place] before, She wants to hold off on doing a bedsearch, she stated that with the Dementia the patient needs routine and familiar Her daughter is her caregiver She stated that the only thing the house does not have is a hospital bed She will talk with her sister as well         Expected Discharge Plan and Services                                                 Social Determinants of Health (SDOH) Interventions    Readmission Risk Interventions     No data to display

## 2022-08-27 NOTE — Progress Notes (Signed)
[facility] [place] [facility] [place] NAME: Mallory Peters    MR#:  [id number]  DATE OF BIRTH:  1941-02-23  SUBJECTIVE:  Daughter at bedside. Has baseline dementia lives with daughter at home had mechanical fall and could not move. Brought to the emergency room found to have right displaced femoral neck fracture.   Per daughter patient was a bit confused. Did eat some breakfast. Work with PT.   VITALS:  Blood pressure (!) 91/56, pulse 85, temperature 98 F (36.7 C), resp. rate 20, height '5\' 2"'$  (1.575 m), weight 59 kg, SpO2 90 %.  PHYSICAL EXAMINATION:   GENERAL:  81 y.o.-year-old patient lying in the bed with no acute distress.  LUNGS: Normal breath sounds bilaterally, no wheezing CARDIOVASCULAR: S1, S2 normal. No murmurs,   ABDOMEN: Soft, nontender, nondistended. Bowel sounds present.  EXTREMITIES: No  edema b/l.    NEUROLOGIC: nonfocal  pt has dementia at baseline LABORATORY PANEL:  CBC Recent Labs  Lab 08/27/22 0518  WBC 10.3  HGB 9.7*  HCT 28.5*  PLT 149*     Chemistries  Recent Labs  Lab 08/25/22 1041 08/26/22 0548 08/27/22 0518  NA  --    < > 138  K  --    < > 3.8  CL  --    < > 108  CO2  --    < > 25  GLUCOSE  --    < > 122*  BUN  --    < > 12  CREATININE  --    < > 0.64  CALCIUM  --    < > 9.0  AST 20  --   --   ALT 15  --   --   ALKPHOS 64  --   --   BILITOT 0.8  --   --    < > = values in this interval not displayed.    Cardiac Enzymes No results for input(s): "TROPONINI" in the last 168 hours. RADIOLOGY:  DG Hip Port Unilat With Pelvis 1V Right  Result Date: 08/26/2022 CLINICAL DATA:  Hip fracture, postop EXAM: DG HIP (WITH OR WITHOUT PELVIS) 1V PORT RIGHT COMPARISON:  08/25/2022 FINDINGS: Right hip hemiarthroplasty. No hardware or bony complicating feature. Normal AP alignment. IMPRESSION: Right hip hemiarthroplasty.  No complicating feature. Electronically Signed   By: Rolm Baptise M.D.   On: 08/26/2022 11:42    Chest Portable 1 View  Result Date: 08/25/2022 CLINICAL DATA:  Dementia, post fall. EXAM: PORTABLE CHEST 1 VIEW COMPARISON:  August 25, 2022 CT of the chest, abdomen and pelvis FINDINGS: EKG leads project over the chest. Cardiomediastinal contours and hilar structures are stable. Lungs are clear.  No pneumothorax. Mild curvature of the spine with levo convexity at the thoracolumbar junction. No acute skeletal process on limited assessment. IMPRESSION: No acute cardiopulmonary disease. Electronically Signed   By: Zetta Bills M.D.   On: 08/25/2022 13:40    Assessment and Plan   Mallory Peters is a 81 year old female with hypertension, dementia, anxiety, depression, hyperlipidemia, who presents emergency department for chief concerns of unwitnessed fall and right hip pain.    Right hip pelvis x-ray 2-3 view: Was read as mildly displaced right femoral neck fracture.   CT head without contrast and CT cervical spine without contrast: Was read as no skull fracture or intracranial hemorrhage.  No cervical spine fracture or subluxation.  Stable mild diffuse cerebral and cerebellar atrophy and moderate chronic small vessel white matter ischemic  changes in both cerebral hemispheres.  Stable densely calcified meningioma in the left lateral frontoparietal region.   CT chest abdomen pelvis with contrast: No acute abnormality in the chest, abdomen or pelvis.  Right femoral neck fracture with mild anterior and proximal displacement of the distal fragment.  Mild changes of COPD.  Mild sigmoid and descending: Diverticulosis.  2.4 cm right ovarian dermoid with little changes since 2014.  Closed displaced fracture of left femoral neck ([place]) - Fall precautions -Orthopedic consult with Dr. Mack Guise - patient is status post Right hemiarthroplasty -PT OT and TOC for discharge planning -- PRN pain meds.  -HLD (hyperlipidemia) - Rosuvastatin 10 mg nightly resumed   Depression With anxiety - continue  Zoloft and Xanax prn   Meningioma (HCC) - Appears calcified on CT imaging - Chronic diastolic CHF (congestive heart failure) ([place]) - Echo on 02/17/2020: read as estimated ejection fraction 40 to 86%, grade 1 diastolic dysfunction   Seizures (HCC) - Resumed home Keppra  - Lorazepam 1 mg IV as needed for seizure    Dementia of the Alzheimer's type ([place]) - Home donepezil  resumed    Procedures: right hemiarthroplasty Family communication : daughter in the room Consults : Dr. Mack Guise CODE STATUS: DNR discussed with daughter DVT Prophylaxis : Level of care: Med-Surg Status is: Inpatient Remains inpatient appropriate because: s/p right hip fracture surgery PT/OT and toc for d/c planning    TOTAL TIME TAKING CARE OF THIS PATIENT: 35 minutes.  >50% time spent on counselling and coordination of care  Note: This dictation was prepared with Dragon dictation along with smaller phrase technology. Any transcriptional errors that result from this process are unintentional.  Fritzi Mandes M.D    [facility]   CC: Primary care physician; Idelle Crouch, MD

## 2022-08-28 DIAGNOSIS — S72002A Fracture of unspecified part of neck of left femur, initial encounter for closed fracture: Secondary | ICD-10-CM | POA: Diagnosis not present

## 2022-08-28 LAB — CBC
HCT: 25.3 % — ABNORMAL LOW (ref 36.0–46.0)
Hemoglobin: 8.6 g/dL — ABNORMAL LOW (ref 12.0–15.0)
MCH: 28.9 pg (ref 26.0–34.0)
MCHC: 34 g/dL (ref 30.0–36.0)
MCV: 84.9 fL (ref 80.0–100.0)
Platelets: 146 10*3/uL — ABNORMAL LOW (ref 150–400)
RBC: 2.98 MIL/uL — ABNORMAL LOW (ref 3.87–5.11)
RDW: 12.9 % (ref 11.5–15.5)
WBC: 9.1 10*3/uL (ref 4.0–10.5)
nRBC: 0 % (ref 0.0–0.2)

## 2022-08-28 LAB — BASIC METABOLIC PANEL
Anion gap: 4 — ABNORMAL LOW (ref 5–15)
BUN: 16 mg/dL (ref 8–23)
CO2: 26 mmol/L (ref 22–32)
Calcium: 8.9 mg/dL (ref 8.9–10.3)
Chloride: 106 mmol/L (ref 98–111)
Creatinine, Ser: 0.72 mg/dL (ref 0.44–1.00)
GFR, Estimated: >60 mL/min (ref >60)
Glucose, Bld: 127 mg/dL — ABNORMAL HIGH (ref 70–99)
Potassium: 3.2 mmol/L — ABNORMAL LOW (ref 3.5–5.1)
Sodium: 136 mmol/L (ref 135–145)

## 2022-08-28 LAB — SURGICAL PATHOLOGY

## 2022-08-28 MED ORDER — VITAMIN D 25 MCG (1000 UNIT) PO TABS
2000.0000 [IU] | ORAL_TABLET | Freq: Every day | ORAL | Status: DC
  Administered 2022-08-28 – 2022-08-31 (×4): 2000 [IU] via ORAL
  Filled 2022-08-28 (×4): qty 2

## 2022-08-28 MED ORDER — VITAMIN B-12 1000 MCG PO TABS
1000.0000 ug | ORAL_TABLET | Freq: Every day | ORAL | Status: DC
  Administered 2022-08-28 – 2022-08-31 (×4): 1000 ug via ORAL
  Filled 2022-08-28 (×4): qty 1

## 2022-08-28 MED ORDER — POTASSIUM CHLORIDE CRYS ER 10 MEQ PO TBCR
10.0000 meq | EXTENDED_RELEASE_TABLET | Freq: Two times a day (BID) | ORAL | Status: DC
  Administered 2022-08-28 – 2022-08-31 (×7): 10 meq via ORAL
  Filled 2022-08-28 (×7): qty 1

## 2022-08-28 MED ORDER — OXYCODONE HCL 5 MG PO TABS: 5.0000 mg | ORAL_TABLET | ORAL | Status: DC | PRN

## 2022-08-28 NOTE — Progress Notes (Signed)
Nutrition Follow-up  DOCUMENTATION CODES:   Not applicable  INTERVENTION:   -Continue MVI with minerals daily -Continue regular diet -D/c Ensure Enlive -Magic cup TID with meals, each supplement provides 290 kcal and 9 grams of protein   NUTRITION DIAGNOSIS:   Increased nutrient needs related to hip fracture, post-op healing as evidenced by estimated needs.  Ongoing  GOAL:   Patient will meet greater than or equal to 90% of their needs  Progressing   MONITOR:   PO intake, Supplement acceptance  REASON FOR ASSESSMENT:   Consult Hip fracture protocol  ASSESSMENT:   81 year old female with HTN, dementia, anxiety, depression, and HLD. She presented to the ED after a fall with subsequent R hip pain. In the ED it was noted that she had a mildly displaced R femoral neck fracture. Patient admitted with plan for surgical intervention.  11/5- s/p Procedure(s): Right hip hemiarthroplasty      Reviewed I/O's: +262 ml x 24 hours and -1.5 L since admission  UOP: 1.3 L x 24 hours   Spoke with pt and daughter at bedside. Daughter provided history secondary to dementia. She shares that pt has experienced a general decline in health over the past 3 years due to dementia. Eating has been a "struggle"; some days pt will eat well and other times she will refuse food. Liberalized diet is provided at home and daughter will provide whatever pt would like to eat. Pt consumed chicken tenders, mac and cheese, and a brownie last night for dinner. Noted meal completions 100%. Intake has not been as good today, she consumed a few bites of lunch and a few bites of a chocolate chip cookie. Pt has been refusing Ensure, although she will usually drink this at home.   Pt daughter struggling over decision of SNF vs home. She also reports concern over liberalized diet, choosing foods that pt will eat over foods that are nutritionally balanced. RD provided education related to dementia and encouraged a  liberalized diet to improve nutritional intake and quality of life. Affirmed daughter that her care and presence is supportive to pt, especially in an unfamiliar environment. EMotional support and therapeutic listening provided.   Medications reviewed and include vitamin D3, vitamin B-12, colace, keppra, and potassium chloride.   Labs reviewed: K: 3.2, CBGS: 100 (inpatient orders for glycemic control are none).    NUTRITION - FOCUSED PHYSICAL EXAM:  Flowsheet Row Most Recent Value  Orbital Region Mild depletion  Upper Arm Region Mild depletion  Thoracic and Lumbar Region No depletion  Buccal Region No depletion  Temple Region Mild depletion  Clavicle Bone Region Mild depletion  Clavicle and Acromion Bone Region No depletion  Scapular Bone Region No depletion  Dorsal Hand Mild depletion  Patellar Region Mild depletion  Anterior Thigh Region Mild depletion  Posterior Calf Region Mild depletion  Edema (RD Assessment) None  Hair Reviewed  Eyes Reviewed  Mouth Reviewed  Skin Reviewed  Nails Reviewed       Diet Order:   Diet Order             Diet regular Room service appropriate? Yes; Fluid consistency: Thin  Diet effective now                   EDUCATION NEEDS:   Education needs have been addressed  Skin:  Skin Assessment: Skin Integrity Issues: Skin Integrity Issues:: Incisions Incisions: closed rt hip  Last BM:  08/27/22  Height:   Ht Readings from Last 1  Encounters:  08/25/22 _0  (1.575 m)    Weight:   Wt Readings from Last 1 Encounters:  08/25/22 59 kg    Ideal Body Weight:  50 kg  BMI:  Body mass index is 23.78 kg/m.  Estimated Nutritional Needs:   Kcal:  1600-1800 kcal  Protein:  80-90 grams  Fluid:  >/= 1.8 L/day    Loistine Chance, RD, LDN, [place] Registered Dietitian II Certified Diabetes Care and Education Specialist Please refer to [facility] for RD and/or RD on-call/weekend/after hours pager

## 2022-08-28 NOTE — Progress Notes (Signed)
[facility] [place] [facility] [place] NAME: Mallory Peters    MR#:  [id number]  DATE OF BIRTH:  April 20, 1941  SUBJECTIVE:  Daughter at bedside. Has baseline dementia lives with daughter at home had mechanical fall and could not move. Brought to the emergency room found to have right displaced femoral neck fracture.   Patient more alert today. Worked with physical therapy. VITALS:  Blood pressure 105/62, pulse 89, temperature 98.1 F (36.7 C), resp. rate 16, height '5\' 2"'$  (1.575 m), weight 59 kg, SpO2 94 %.  PHYSICAL EXAMINATION:   GENERAL:  81 y.o.-year-old patient lying in the bed with no acute distress.  LUNGS: Normal breath sounds bilaterally, no wheezing CARDIOVASCULAR: S1, S2 normal. No murmurs,   ABDOMEN: Soft, nontender, nondistended. Bowel sounds present.  EXTREMITIES: No  edema b/l.    NEUROLOGIC: nonfocal  pt has dementia at baseline LABORATORY PANEL:  CBC Recent Labs  Lab 08/28/22 0646  WBC 9.1  HGB 8.6*  HCT 25.3*  PLT 146*     Chemistries  Recent Labs  Lab 08/25/22 1041 08/26/22 0548 08/28/22 0646  NA  --    < > 136  K  --    < > 3.2*  CL  --    < > 106  CO2  --    < > 26  GLUCOSE  --    < > 127*  BUN  --    < > 16  CREATININE  --    < > 0.72  CALCIUM  --    < > 8.9  AST 20  --   --   ALT 15  --   --   ALKPHOS 64  --   --   BILITOT 0.8  --   --    < > = values in this interval not displayed.    Cardiac Enzymes No results for input(s): "TROPONINI" in the last 168 hours. RADIOLOGY:  No results found.  Assessment and Plan   Mallory Peters is a 81 year old female with hypertension, dementia, anxiety, depression, hyperlipidemia, who presents emergency department for chief concerns of unwitnessed fall and right hip pain.    Right hip pelvis x-ray 2-3 view: Was read as mildly displaced right femoral neck fracture.   CT head without contrast and CT cervical spine without contrast: Was read as no skull fracture or  intracranial hemorrhage.  No cervical spine fracture or subluxation.  Stable mild diffuse cerebral and cerebellar atrophy and moderate chronic small vessel white matter ischemic changes in both cerebral hemispheres.  Stable densely calcified meningioma in the left lateral frontoparietal region.   CT chest abdomen pelvis with contrast: No acute abnormality in the chest, abdomen or pelvis.  Right femoral neck fracture with mild anterior and proximal displacement of the distal fragment.  Mild changes of COPD.  Mild sigmoid and descending: Diverticulosis.  2.4 cm right ovarian dermoid with little changes since 2014.  Closed displaced fracture of left femoral neck ([place]) - Fall precautions -Orthopedic consult with Dr. Mack Guise - patient is status post Right hemiarthroplasty -PT OT and TOC for discharge planning-- recommends rehab. Daughter in agreement.  -- PRN pain meds. --hgb 8.6 (13.5) transfuse as needed  -HLD (hyperlipidemia) - Rosuvastatin 10 mg nightly resumed   Depression With anxiety - continue Zoloft and Xanax prn   Meningioma (HCC) - Appears calcified on CT imaging - Chronic diastolic CHF (congestive heart failure) ([place]) - Echo on 02/17/2020: read as estimated ejection fraction 40  to 45%, grade 1 diastolic dysfunction   Seizures (HCC) - Resumed home Keppra  - Lorazepam 1 mg IV as needed for seizure    Dementia of the Alzheimer's type ([place]) - Home donepezil  resumed    Procedures: right hemiarthroplasty Family communication : daughter in the room Consults : Dr. Mack Guise CODE STATUS: DNR discussed with daughter DVT Prophylaxis : Level of care: Med-Surg Status is: Inpatient Remains inpatient appropriate because: s/p right hip fracture surgery PT/OT and toc for d/c planning    TOTAL TIME TAKING CARE OF THIS PATIENT: 35 minutes.  >50% time spent on counselling and coordination of care  Note: This dictation was prepared with Dragon dictation along with smaller phrase  technology. Any transcriptional errors that result from this process are unintentional.  Fritzi Mandes M.D    [facility]   CC: Primary care physician; Mallory Crouch, MD

## 2022-08-28 NOTE — Progress Notes (Signed)
The above named patient is recommended to go to Short Term Rehab for strengthening and gait training for balance.  It is expected that the Short Term Rehab stay will be less than 30 days.  The patient is expected to return home after Rehab.  ?

## 2022-08-28 NOTE — Progress Notes (Signed)
Physical Therapy Treatment Patient Details Name: Mallory Peters MRN: [id number] DOB: 07-06-1941 Today's Date: 08/28/2022   History of Present Illness 81 year old female with history of dementia and stroke who had an unwitnessed falls and was admitted with right femoral neck hip fracture. S/p right hip hemiarthroplasty (posterior approach) 11/5.    PT Comments    Pt intermittently able to follow simple instructions but ultimately needed a lot of assist, encouragement, etc to participate.  Co-treat overlap with OT ~30 minutes (one unit each) with bed mobility, multiple sit to stand efforts (all heavy assisted) and family/pt education.  Supine exercises per tolerance/ability to participate post mobility.  Pt with baseline confusion confounded by pain meds and hospitalization.   Recommendations for follow up therapy are one component of a multi-disciplinary discharge planning process, led by the attending physician.  Recommendations may be updated based on patient status, additional functional criteria and insurance authorization.  Follow Up Recommendations  Skilled nursing-short term rehab (<3 hours/day) Can patient physically be transported by private vehicle: No   Assistance Recommended at Discharge Frequent or constant Supervision/Assistance  Patient can return home with the following Two people to help with walking and/or transfers;A lot of help with bathing/dressing/bathroom;Direct supervision/assist for medications management;Assistance with feeding;Assistance with cooking/housework;Assist for transportation;Help with stairs or ramp for entrance   Equipment Recommendations  BSC/3in1;Hospital bed    Recommendations for Other Services       Precautions / Restrictions Precautions Precautions: Posterior Hip Precaution Booklet Issued: Yes (comment) Restrictions Weight Bearing Restrictions: Yes RLE Weight Bearing: Weight bearing as tolerated     Mobility  Bed Mobility Overal  bed mobility: Needs Assistance Bed Mobility: Rolling Rolling: Mod assist, Max assist (pt struggles to initiate movement to assist with rolling)   Supine to sit: Max assist Sit to supine: Max assist, +2 for physical assistance        Transfers Overall transfer level: Needs assistance Equipment used: Rolling walker (2 wheels) Transfers: Sit to/from Stand Sit to Stand: Mod assist, +2 physical assistance           General transfer comment: Took a lot of her her scooted to EOB/feet on floor.  With heavy cuing/encouragement she was able to initiate transition to standing with constant tactile cuing and intermittent min/mod assist.  Constant assist on hips to keep them forward, pt consistently leaning over and unable to appropriately push up through walker    Ambulation/Gait               General Gait Details: did manage to move a foot a few times with heavy cuing and unweighting assist.  Pt ultimately too limited to actually take any steps this AM.  Hesitant to put much weight through the R.   Stairs             Wheelchair Mobility    Modified Rankin (Stroke Patients Only)       Balance Overall balance assessment: Needs assistance, History of Falls Sitting-balance support: Feet supported, Bilateral upper extremity supported Sitting balance-Leahy Scale: Poor Sitting balance - Comments: at least minimal assistance was required to maintain sitting balance with a posterior and right lean (patient likely trying to return to bed). dynamic balance is zero with increased posteior lean without UE supported on bed   Standing balance support: Bilateral upper extremity supported Standing balance-Leahy Scale: Poor Standing balance comment: Pt leaning forward with hips shifting backward needing constant assist with both.  Cognition Arousal/Alertness: Lethargic Behavior During Therapy: Flat affect Overall Cognitive Status: Difficult to  assess                                 General Comments: Pt awake but with distant/flat affect much of the time and delayed initiation of tasks/instructions        Exercises Total Joint Exercises Ankle Circles/Pumps: PROM, AAROM, 10 reps (pt lacks DF to neutral) Quad Sets: Strengthening, 10 reps, AROM Heel Slides: AAROM, PROM, 10 reps (AROM/light resistance leg ext) Hip ABduction/ADduction: AAROM, PROM, 10 reps (mostly PROM with inconsistent pt effort for movement)    General Comments General comments (skin integrity, edema, etc.): Pt intermittently able to make adjustments when cued, but inconsistent and functionally very limited      Pertinent Vitals/Pain Pain Assessment Pain Assessment: Faces Faces Pain Scale: Hurts a little bit    Home Living                          Prior Function            PT Goals (current goals can now be found in the care plan section) Progress towards PT goals: Progressing toward goals (slow progress)    Frequency    7X/week      PT Plan Current plan remains appropriate    Co-evaluation PT/OT/SLP Co-Evaluation/Treatment: Yes Reason for Co-Treatment: Complexity of the patient's impairments (multi-system involvement);Necessary to address cognition/behavior during functional activity;To address functional/ADL transfers;For patient/therapist safety PT goals addressed during session: Mobility/safety with mobility;Proper use of DME;Balance        AM-PAC PT "6 Clicks" Mobility   Outcome Measure  Help needed turning from your back to your side while in a flat bed without using bedrails?: A Lot Help needed moving from lying on your back to sitting on the side of a flat bed without using bedrails?: A Lot Help needed moving to and from a bed to a chair (including a wheelchair)?: Total Help needed standing up from a chair using your arms (e.g., wheelchair or bedside chair)?: Total Help needed to walk in hospital room?:  Total Help needed climbing 3-5 steps with a railing? : Total 6 Click Score: 8    End of Session Equipment Utilized During Treatment: Gait belt Activity Tolerance: Patient limited by fatigue;Patient limited by lethargy Patient left: in bed;with call bell/phone within reach;with bed alarm set;with family/visitor present;with SCD's reapplied Nurse Communication: Mobility status;Precautions PT Visit Diagnosis: Difficulty in walking, not elsewhere classified (R26.2);Muscle weakness (generalized) (M62.81);History of falling (Z91.81) Pain - Right/Left: Right Pain - part of body: Hip     Time: 0626-9485 PT Time Calculation (min) (ACUTE ONLY): 43 min  Charges:  $Therapeutic Exercise: 8-22 mins $Therapeutic Activity: 8-22 mins                     Kreg Shropshire, DPT 08/28/2022, 11:33 AM

## 2022-08-28 NOTE — Plan of Care (Signed)
  Problem: Education: Goal: Knowledge of General Education information will improve Description: Including pain rating scale, medication(s)/side effects and non-pharmacologic comfort measures Outcome: Progressing   Problem: Health Behavior/Discharge Planning: Goal: Ability to manage health-related needs will improve Outcome: Progressing   Problem: Clinical Measurements: Goal: Ability to maintain clinical measurements within normal limits will improve Outcome: Progressing Goal: Will remain free from infection Outcome: Progressing Goal: Diagnostic test results will improve Outcome: Progressing Goal: Respiratory complications will improve Outcome: Progressing Goal: Cardiovascular complication will be avoided Outcome: Progressing   Problem: Activity: Goal: Risk for activity intolerance will decrease Outcome: Progressing   Problem: Nutrition: Goal: Adequate nutrition will be maintained Outcome: Progressing   Problem: Coping: Goal: Level of anxiety will decrease Outcome: Progressing   Problem: Elimination: Goal: Will not experience complications related to bowel motility Outcome: Progressing Goal: Will not experience complications related to urinary retention Outcome: Progressing   Problem: Pain Managment: Goal: General experience of comfort will improve Outcome: Progressing   Problem: Safety: Goal: Ability to remain free from injury will improve Outcome: Progressing   Problem: Skin Integrity: Goal: Risk for impaired skin integrity will decrease Outcome: Progressing   Problem: Education: Goal: Knowledge of General Education information will improve Description: Including pain rating scale, medication(s)/side effects and non-pharmacologic comfort measures Outcome: Progressing   Problem: Health Behavior/Discharge Planning: Goal: Ability to manage health-related needs will improve Outcome: Progressing   Problem: Clinical Measurements: Goal: Ability to maintain  clinical measurements within normal limits will improve Outcome: Progressing Goal: Will remain free from infection Outcome: Progressing Goal: Diagnostic test results will improve Outcome: Progressing Goal: Respiratory complications will improve Outcome: Progressing Goal: Cardiovascular complication will be avoided Outcome: Progressing   Problem: Activity: Goal: Risk for activity intolerance will decrease Outcome: Progressing   Problem: Nutrition: Goal: Adequate nutrition will be maintained Outcome: Progressing   Problem: Coping: Goal: Level of anxiety will decrease Outcome: Progressing   Problem: Elimination: Goal: Will not experience complications related to bowel motility Outcome: Progressing Goal: Will not experience complications related to urinary retention Outcome: Progressing   Problem: Pain Managment: Goal: General experience of comfort will improve Outcome: Progressing   Problem: Safety: Goal: Ability to remain free from injury will improve Outcome: Progressing   Problem: Skin Integrity: Goal: Risk for impaired skin integrity will decrease Outcome: Progressing   Problem: Education: Goal: Verbalization of understanding the information provided (i.e., activity precautions, restrictions, etc) will improve Outcome: Progressing Goal: Individualized Educational Video(s) Outcome: Progressing   Problem: Activity: Goal: Ability to ambulate and perform ADLs will improve Outcome: Progressing   Problem: Clinical Measurements: Goal: Postoperative complications will be avoided or minimized Outcome: Progressing   Problem: Self-Concept: Goal: Ability to maintain and perform role responsibilities to the fullest extent possible will improve Outcome: Progressing   Problem: Pain Management: Goal: Pain level will decrease Outcome: Progressing

## 2022-08-28 NOTE — Progress Notes (Signed)
  Subjective:  POD #2 s/p right hip hemiarthroplasty.   Patient reports right pain as mild.  Daughter is at the bedside.  Objective:   VITALS:   Vitals:   08/27/22 1627 08/27/22 1800 08/27/22 2254 08/28/22 0846  BP: (!) 108/53  (!) 105/58 105/62  Pulse: 97  (!) 105 89  Resp: '16  20 16  '$ Temp:  97.7 F (36.5 C) 98.8 F (37.1 C) 98.1 F (36.7 C)  TempSrc:  Axillary    SpO2: 94%  91% 94%  Weight:      Height:        PHYSICAL EXAM: Right lower extremity Neurovascular intact Sensation intact distally Intact pulses distally Dorsiflexion/Plantar flexion intact Aquacel dressing: moderate sanguinous drainage No cellulitis present Compartment soft  LABS  Results for orders placed or performed during the hospital encounter of 08/25/22 (from the past 24 hour(s))  CBC     Status: Abnormal   Collection Time: 08/28/22  6:46 AM  Result Value Ref Range   WBC 9.1 4.0 - 10.5 K/uL   RBC 2.98 (L) 3.87 - 5.11 MIL/uL   Hemoglobin 8.6 (L) 12.0 - 15.0 g/dL   HCT 25.3 (L) 36.0 - 46.0 %   MCV 84.9 80.0 - 100.0 fL   MCH 28.9 26.0 - 34.0 pg   MCHC 34.0 30.0 - 36.0 g/dL   RDW 12.9 11.5 - 15.5 %   Platelets 146 (L) 150 - 400 K/uL   nRBC 0.0 0.0 - 0.2 %  Basic metabolic panel     Status: Abnormal   Collection Time: 08/28/22  6:46 AM  Result Value Ref Range   Sodium 136 135 - 145 mmol/L   Potassium 3.2 (L) 3.5 - 5.1 mmol/L   Chloride 106 98 - 111 mmol/L   CO2 26 22 - 32 mmol/L   Glucose, Bld 127 (H) 70 - 99 mg/dL   BUN 16 8 - 23 mg/dL   Creatinine, Ser 0.72 0.44 - 1.00 mg/dL   Calcium 8.9 8.9 - 10.3 mg/dL   GFR, Estimated >60 >60 mL/min   Anion gap 4 (L) 5 - 15    No results found.  Assessment/Plan: 2 Days Post-Op   Principal Problem:   Closed displaced fracture of left femoral neck (HCC) Active Problems:   Dementia of the Alzheimer's type (HCC)   Seizures (HCC)   Chronic diastolic CHF (congestive heart failure) (HCC)   Meningioma (HCC)   Depression   HLD  (hyperlipidemia)  Patient doing well postop making expected progress from an orthopedic standpoint.  Continue physical therapy.  Continue discharge planning.  Patient will likely need a skilled nursing facility.  Continue baseline Plavix and aspirin for DVT prophylaxis.    Thornton Park , MD 08/28/2022, 2:51 PM

## 2022-08-28 NOTE — Progress Notes (Addendum)
Occupational Therapy Treatment Patient Details Name: Mallory Peters MRN: [id number] DOB: 03-28-1941 Today's Date: 08/28/2022   History of present illness 81 year old female with history of dementia and stroke who had an unwitnessed falls and was admitted with right femoral neck hip fracture. S/p right hip hemiarthroplasty (posterior approach) 11/5.   OT comments  Upon entering session, pt resting in bed. Pt and daughter agreeable to OT/PT co-treatment to maximize safety and participation. Tx session targeted improving tolerance for functional mobility in the setting of ADL tasks. Pt was incontinent of bowel and bladder during session. She required Max A for rolling in order for OT to assist with peri care and linen change. Pt then required Mod A +2 for standing trials and demonstrated difficulty with maintaining upright standing posture. Pt inconsistently followed one step commands this date. She required constant cues to adhere to posterior hip precautions with mobility 2/2 impaired cognition. She demonstrated difficulty with initiation and sequencing of mobility/tasks requiring multimodal cues. Pt left as received with all needs in reach. Pt is making progress toward goal completion. D/C recommendation remains appropriate. OT will continue to follow acutely.      Recommendations for follow up therapy are one component of a multi-disciplinary discharge planning process, led by the attending physician.  Recommendations may be updated based on patient status, additional functional criteria and insurance authorization.    Follow Up Recommendations  Skilled nursing-short term rehab (<3 hours/day)    Assistance Recommended at Discharge Frequent or constant Supervision/Assistance  Patient can return home with the following  Two people to help with walking and/or transfers;A lot of help with bathing/dressing/bathroom;Assistance with cooking/housework;Assist for transportation;Help with stairs or  ramp for entrance;Assistance with feeding;Direct supervision/assist for financial management;Direct supervision/assist for medications management   Equipment Recommendations  BSC/3in1;Hospital bed    Recommendations for Other Services      Precautions / Restrictions Precautions Precautions: Posterior Hip Precaution Booklet Issued: Yes (comment) Restrictions Weight Bearing Restrictions: Yes RLE Weight Bearing: Weight bearing as tolerated       Mobility Bed Mobility Overal bed mobility: Needs Assistance Bed Mobility: Rolling Rolling: Max assist   Supine to sit: Max assist Sit to supine: Max assist, +2 for physical assistance   General bed mobility comments: Max A to scoot hips forward at EOB    Transfers Overall transfer level: Needs assistance Equipment used: Rolling walker (2 wheels) Transfers: Sit to/from Stand Sit to Stand: Mod assist, +2 physical assistance           General transfer comment: Multimodal cuing to initiate STS. Constant assist on hips to keep them forward, pt consistently leaning over and unable to appropriately push up through walker     Balance Overall balance assessment: Needs assistance, History of Falls Sitting-balance support: Feet supported, Bilateral upper extremity supported Sitting balance-Leahy Scale: Poor Sitting balance - Comments: Min A for static sitting balance, posterior and R lateral lean in sitting Postural control: Right lateral lean, Posterior lean Standing balance support: Bilateral upper extremity supported Standing balance-Leahy Scale: Poor Standing balance comment: forward lean, unable to maintain upright standing position                           ADL either performed or assessed with clinical judgement   ADL Overall ADL's : Needs assistance/impaired                 Upper Body Dressing : Maximal assistance;Sitting   Lower Body Dressing:  Maximal assistance;Bed level Lower Body Dressing Details  (indicate cue type and reason): pt attempting to lift BLEs off surface of bed to assist with LB dressing, however, unsuccessful     Toileting- Clothing Manipulation and Hygiene: Maximal assistance;Bed level Toileting - Clothing Manipulation Details (indicate cue type and reason): for peri care       General ADL Comments: pt incontinent of bowel and bladder during session requiring linen change    Extremity/Trunk Assessment Upper Extremity Assessment Upper Extremity Assessment: Generalized weakness   Lower Extremity Assessment Lower Extremity Assessment: Generalized weakness        Vision Patient Visual Report: No change from baseline     Perception     Praxis      Cognition Arousal/Alertness: Lethargic Behavior During Therapy: Flat affect Overall Cognitive Status: History of cognitive impairments - at baseline                                 General Comments: Pt inconsistently followed one step commands, difficulty with initiation and sequencing requiring multimodal cues, oriented to name only. Pt reports cognition is more limited at this time compared to baseline        Exercises      Shoulder Instructions       General Comments Pt intermittently able to make adjustments when cued, but inconsistent and functionally very limited    Pertinent Vitals/ Pain       Pain Assessment Pain Assessment: Faces Faces Pain Scale: Hurts a little bit Pain Location: R hip Pain Descriptors / Indicators: Guarding, Grimacing Pain Intervention(s): Limited activity within patient's tolerance, Monitored during session, Premedicated before session, Repositioned  Home Living                                          Prior Functioning/Environment              Frequency  Min 2X/week        Progress Toward Goals  OT Goals(current goals can now be found in the care plan section)  Progress towards OT goals: Progressing toward goals  Acute  Rehab OT Goals Patient Stated Goal: go home OT Goal Formulation: With patient/family Time For Goal Achievement: 09/10/22 Potential to Achieve Goals: [place] Discharge plan remains appropriate;Frequency remains appropriate    Co-evaluation    PT/OT/SLP Co-Evaluation/Treatment: Yes Reason for Co-Treatment: Complexity of the patient's impairments (multi-system involvement);Necessary to address cognition/behavior during functional activity;For patient/therapist safety;To address functional/ADL transfers PT goals addressed during session: Mobility/safety with mobility;Proper use of DME OT goals addressed during session: ADL's and self-care      AM-PAC OT "6 Clicks" Daily Activity     Outcome Measure   Help from another person eating meals?: None Help from another person taking care of personal grooming?: A Little Help from another person toileting, which includes using toliet, bedpan, or urinal?: A Lot Help from another person bathing (including washing, rinsing, drying)?: A Lot Help from another person to put on and taking off regular upper body clothing?: A Lot Help from another person to put on and taking off regular lower body clothing?: A Lot 6 Click Score: 15    End of Session Equipment Utilized During Treatment: Rolling walker (2 wheels);Gait belt  OT Visit Diagnosis: Other abnormalities of gait and mobility (R26.89);Muscle weakness (generalized) (M62.81);Pain Pain -  Right/Left: Right Pain - part of body: Hip   Activity Tolerance Patient limited by pain;Other (comment) (limited 2/2 cognition)   Patient Left in bed;with call bell/phone within reach;with bed alarm set;with family/visitor present;with SCD's reapplied   Nurse Communication Mobility status        Time: 9643-8381 OT Time Calculation (min): 32 min  Charges: OT General Charges $OT Visit: 1 Visit OT Treatments $Self Care/Home Management : 23-37 mins  [facility] MS, OTR/L ascom [phone]   08/28/22, 1:33 PM

## 2022-08-28 NOTE — NC FL2 (Signed)
[place] LEVEL OF CARE SCREENING TOOL     IDENTIFICATION  Patient Name: Mallory Peters Birthdate: [birth year] Sex: female Admission Date (Current Location): 08/25/2022  [facility] [place] Number:  Engineering geologist and Address:  [facility], [address], [place], [place] [address]      Provider Number: [id number]  Attending Physician Name and Address:  Fritzi Mandes, MD  Relative Name and Phone Number:  [name] daughter [phone]    Current Level of Care: Hospital Recommended Level of Care: [place] Prior Approval Number:    Date Approved/Denied:   PASRR Number: pending  Discharge Plan: Home    Current Diagnoses: Patient Active Problem List   Diagnosis Date Noted   Closed displaced fracture of left femoral neck ([place]) 08/25/2022   Chronic diastolic CHF (congestive heart failure) ([place]) 02/20/2020   Meningioma ([place]) 02/20/2020   Depression 02/20/2020   Anxiety 02/20/2020   Elevated troponin 02/20/2020   Demand ischemia 02/20/2020   HLD (hyperlipidemia) 02/20/2020   Seizures ([place]) 02/16/2020   Severe major depression, single episode, without psychotic features ([place]) 03/11/2017   Dementia of the Alzheimer's type ([place]) 03/11/2017    Orientation RESPIRATION BLADDER Height & Weight     Self  Normal External catheter, Incontinent Weight: 59 kg Height:  '5\' 2"'$  (157.5 cm)  BEHAVIORAL SYMPTOMS/MOOD NEUROLOGICAL BOWEL NUTRITION STATUS      Incontinent Diet (See dc summary)  AMBULATORY STATUS COMMUNICATION OF NEEDS Skin   Extensive Assist Verbally Normal, Surgical wounds                       Personal Care Assistance Level of Assistance  Bathing, Feeding, Dressing Bathing Assistance: Maximum assistance Feeding assistance: Limited assistance Dressing Assistance: Maximum assistance     Functional Limitations Info             SPECIAL CARE FACTORS FREQUENCY  PT (By licensed PT), OT (By licensed  OT)     PT Frequency: 5 times per week OT Frequency: 5 times per week            Contractures Contractures Info: Not present    Additional Factors Info  Code Status, Allergies Code Status Info: DNR Allergies Info: Ciprofloxacin, Metronidazole, Norco (Hydrocodone-acetaminophen)           Current Medications (08/28/2022):  This is the current hospital active medication list Current Facility-Administered Medications  Medication Dose Route Frequency Provider Last Rate Last Admin   acetaminophen (TYLENOL) tablet 325-650 mg  325-650 mg Oral Q6H PRN Thornton Park, MD   650 mg at 08/28/22 0849   ALPRAZolam Duanne Moron) tablet 0.25 mg  0.25 mg Oral QHS PRN Thornton Park, MD       alum & mag hydroxide-simeth (MAALOX/MYLANTA) 200-200-20 MG/5ML suspension 30 mL  30 mL Oral Q4H PRN Thornton Park, MD       aspirin EC tablet 81 mg  81 mg Oral BID Thornton Park, MD   81 mg at 08/28/22 0850   bisacodyl (DULCOLAX) suppository 10 mg  10 mg Rectal Daily PRN Thornton Park, MD       cholecalciferol (VITAMIN D3) 25 MCG (1000 UNIT) tablet 2,000 Units  2,000 Units Oral Daily Fritzi Mandes, MD   2,000 Units at 08/28/22 1018   clopidogrel (PLAVIX) tablet 75 mg  75 mg Oral Daily Thornton Park, MD   75 mg at 08/28/22 0849   cyanocobalamin (VITAMIN B12) tablet 1,000 mcg  1,000 mcg Oral Daily Fritzi Mandes,  MD   1,000 mcg at 08/28/22 1018   docusate sodium (COLACE) capsule 100 mg  100 mg Oral BID Thornton Park, MD   100 mg at 08/28/22 0849   donepezil (ARICEPT) tablet 10 mg  10 mg Oral QHS Thornton Park, MD   10 mg at 08/27/22 2232   feeding supplement (ENSURE ENLIVE / ENSURE PLUS) liquid 237 mL  237 mL Oral BID BM Thornton Park, MD   237 mL at 08/28/22 1305   levETIRAcetam (KEPPRA) 100 MG/ML solution 750 mg  750 mg Oral BID Lorna Dibble, RPH   750 mg at 08/28/22 0855   LORazepam (ATIVAN) injection 1 mg  1 mg Intravenous PRN Thornton Park, MD       menthol-cetylpyridinium (CEPACOL)  lozenge 3 mg  1 lozenge Oral PRN Thornton Park, MD       Or   phenol (CHLORASEPTIC) mouth spray 1 spray  1 spray Mouth/Throat PRN Thornton Park, MD       methocarbamol (ROBAXIN) tablet 500 mg  500 mg Oral Q6H PRN Thornton Park, MD       midodrine (PROAMATINE) tablet 2.5 mg  2.5 mg Oral BID Thornton Park, MD   2.5 mg at 08/28/22 0849   modafinil (PROVIGIL) tablet 100 mg  100 mg Oral Daily Thornton Park, MD   100 mg at 08/28/22 0849   ondansetron (ZOFRAN) tablet 4 mg  4 mg Oral Q6H PRN Thornton Park, MD       Or   ondansetron [facility]) injection 4 mg  4 mg Intravenous Q6H PRN Thornton Park, MD       oxyCODONE (Oxy IR/ROXICODONE) immediate release tablet 5 mg  5 mg Oral Q4H PRN Fritzi Mandes, MD       polyethylene glycol (MIRALAX / GLYCOLAX) packet 17 g  17 g Oral Daily PRN Thornton Park, MD       potassium chloride (KLOR-CON M) CR tablet 10 mEq  10 mEq Oral BID Fritzi Mandes, MD   10 mEq at 08/28/22 1018   risperiDONE (RISPERDAL) tablet 0.25-0.5 mg  0.25-0.5 mg Oral Daily PRN Thornton Park, MD       rosuvastatin (CRESTOR) tablet 10 mg  10 mg Oral QHS Thornton Park, MD   10 mg at 08/27/22 2232   senna (SENOKOT) tablet 8.6 mg  1 tablet Oral BID Thornton Park, MD   8.6 mg at 08/28/22 0849   senna-docusate (Senokot-S) tablet 1 tablet  1 tablet Oral QHS PRN Thornton Park, MD       sertraline (ZOLOFT) tablet 75 mg  75 mg Oral QHS Thornton Park, MD   75 mg at 08/27/22 2231   traMADol (ULTRAM) tablet 50 mg  50 mg Oral Q6H Thornton Park, MD   50 mg at 08/28/22 3159     Discharge Medications: Please see discharge summary for a list of discharge medications.  Relevant Imaging Results:  Relevant Lab Results:   Additional Information SS# [id number]  Conception Oms, RN

## 2022-08-28 NOTE — Care Management Important Message (Signed)
Important Message  Patient Details  Name: Mallory Peters MRN: [id number] Date of Birth: [birth year]   Medicare Important Message Given:  N/A - LOS <3 / Initial given by admissions     Juliann Pulse A Makiyla Linch 08/28/2022, 9:37 AM

## 2022-08-28 NOTE — TOC Progression Note (Signed)
Transition of Care [facility]) - Progression Note    Patient Details  Name: Mallory Peters MRN: [id number] Date of Birth: 11-Oct-1941  Transition of Care [facility]) CM/SW Acacia Villas, RN Phone Number: 08/28/2022, 12:23 PM  Clinical Narrative:    Spoke with the patient's daughter Mallory Peters in the room  She is agreeable for the patient to go to [facility] SNF She requested it to be at a SNF that she can spend nights with the patient I explained that I will do the bedsearch and then review the bed offers       Expected Discharge Plan and Services                                                 Social Determinants of Health (SDOH) Interventions    Readmission Risk Interventions     No data to display

## 2022-08-29 DIAGNOSIS — S72002A Fracture of unspecified part of neck of left femur, initial encounter for closed fracture: Secondary | ICD-10-CM | POA: Diagnosis not present

## 2022-08-29 LAB — BASIC METABOLIC PANEL
Anion gap: 6 (ref 5–15)
BUN: 12 mg/dL (ref 8–23)
CO2: 29 mmol/L (ref 22–32)
Calcium: 8.8 mg/dL — ABNORMAL LOW (ref 8.9–10.3)
Chloride: 101 mmol/L (ref 98–111)
Creatinine, Ser: 0.72 mg/dL (ref 0.44–1.00)
GFR, Estimated: >60 mL/min (ref >60)
Glucose, Bld: 118 mg/dL — ABNORMAL HIGH (ref 70–99)
Potassium: 3.5 mmol/L (ref 3.5–5.1)
Sodium: 136 mmol/L (ref 135–145)

## 2022-08-29 LAB — CBC
HCT: 23.4 % — ABNORMAL LOW (ref 36.0–46.0)
Hemoglobin: 7.9 g/dL — ABNORMAL LOW (ref 12.0–15.0)
MCH: 28.8 pg (ref 26.0–34.0)
MCHC: 33.8 g/dL (ref 30.0–36.0)
MCV: 85.4 fL (ref 80.0–100.0)
Platelets: 147 10*3/uL — ABNORMAL LOW (ref 150–400)
RBC: 2.74 MIL/uL — ABNORMAL LOW (ref 3.87–5.11)
RDW: 12.8 % (ref 11.5–15.5)
WBC: 8 10*3/uL (ref 4.0–10.5)
nRBC: 0 % (ref 0.0–0.2)

## 2022-08-29 LAB — MAGNESIUM: Magnesium: 2.1 mg/dL (ref 1.7–2.4)

## 2022-08-29 MED ORDER — ACETAMINOPHEN 500 MG PO TABS
1000.0000 mg | ORAL_TABLET | Freq: Four times a day (QID) | ORAL | Status: DC | PRN
  Administered 2022-08-30 – 2022-08-31 (×2): 1000 mg via ORAL
  Filled 2022-08-29 (×2): qty 2

## 2022-08-29 MED ORDER — IBUPROFEN 400 MG PO TABS: 400.0000 mg | ORAL_TABLET | Freq: Four times a day (QID) | ORAL | Status: DC | PRN

## 2022-08-29 NOTE — Progress Notes (Signed)
  Subjective:  POD #3 s/p right hip hemiarthroplasty.  She with history of dementia.  She is drowsy today.  Her daughter is at the bedside.  Patient no acute distress.    Objective:   VITALS:   Vitals:   08/28/22 0846 08/28/22 1659 08/28/22 2251 08/29/22 0911  BP: 105/62 118/61 (!) 104/56 (!) 109/57  Pulse: 89 98 85 83  Resp: '16 16 17 18  '$ Temp: 98.1 F (36.7 C) 97.8 F (36.6 C) 98.9 F (37.2 C) 98.2 F (36.8 C)  TempSrc:   Oral   SpO2: 94% 92% 94% 96%  Weight:      Height:        PHYSICAL EXAM: Right lower extremity Neurovascular intact Sensation intact distally Intact pulses distally Dorsiflexion/Plantar flexion intact Incision: moderate drainage No cellulitis present Compartment soft  LABS  Results for orders placed or performed during the hospital encounter of 08/25/22 (from the past 24 hour(s))  CBC     Status: Abnormal   Collection Time: 08/29/22  3:40 AM  Result Value Ref Range   WBC 8.0 4.0 - 10.5 K/uL   RBC 2.74 (L) 3.87 - 5.11 MIL/uL   Hemoglobin 7.9 (L) 12.0 - 15.0 g/dL   HCT 23.4 (L) 36.0 - 46.0 %   MCV 85.4 80.0 - 100.0 fL   MCH 28.8 26.0 - 34.0 pg   MCHC 33.8 30.0 - 36.0 g/dL   RDW 12.8 11.5 - 15.5 %   Platelets 147 (L) 150 - 400 K/uL   nRBC 0.0 0.0 - 0.2 %  Basic metabolic panel     Status: Abnormal   Collection Time: 08/29/22 10:15 AM  Result Value Ref Range   Sodium 136 135 - 145 mmol/L   Potassium 3.5 3.5 - 5.1 mmol/L   Chloride 101 98 - 111 mmol/L   CO2 29 22 - 32 mmol/L   Glucose, Bld 118 (H) 70 - 99 mg/dL   BUN 12 8 - 23 mg/dL   Creatinine, Ser 0.72 0.44 - 1.00 mg/dL   Calcium 8.8 (L) 8.9 - 10.3 mg/dL   GFR, Estimated >60 >60 mL/min   Anion gap 6 5 - 15  Magnesium     Status: None   Collection Time: 08/29/22 10:15 AM  Result Value Ref Range   Magnesium 2.1 1.7 - 2.4 mg/dL    No results found.  Assessment/Plan: 3 Days Post-Op   Principal Problem:   Closed displaced fracture of left femoral neck (HCC) Active Problems:    Dementia of the Alzheimer's type (HCC)   Seizures (HCC)   Chronic diastolic CHF (congestive heart failure) (HCC)   Meningioma (HCC)   Depression   HLD (hyperlipidemia)  Patient appears sensitive to pain medication.  We will work towards weaning patient down off pain medications onto Tylenol as her pain allows.  Continue with physical therapy.  Patient will likely benefit from a skilled nursing facility stay after discharge.  Patient's daughter will work with our case manager to find a place that is mutually acceptable.    Thornton Park , MD 08/29/2022, 2:09 PM

## 2022-08-29 NOTE — TOC Progression Note (Signed)
Transition of Care [facility]) - Progression Note    Patient Details  Name: Mallory Peters MRN: [id number] Date of Birth: [birth year]  Transition of Care [facility]) CM/SW [place], RN Phone Number: 08/29/2022, 3:24 PM  Clinical Narrative:            Expected Discharge Plan and Services  Uploaded clinical notes to [place] MUST, PASSR pending, has bed offers but not PASSR                                               Social Determinants of Health (SDOH) Interventions    Readmission Risk Interventions     No data to display

## 2022-08-29 NOTE — Care Management Important Message (Signed)
Important Message  Patient Details  Name: Mallory Peters MRN: [id number] Date of Birth: Jan 30, 1941   Medicare Important Message Given:  N/A - LOS <3 / Initial given by admissions     Mallory Peters 08/29/2022, 9:55 AM

## 2022-08-29 NOTE — Plan of Care (Signed)
  Problem: Education: Goal: Knowledge of General Education information will improve Description: Including pain rating scale, medication(s)/side effects and non-pharmacologic comfort measures Outcome: Progressing   Problem: Health Behavior/Discharge Planning: Goal: Ability to manage health-related needs will improve Outcome: Progressing   Problem: Clinical Measurements: Goal: Ability to maintain clinical measurements within normal limits will improve Outcome: Progressing Goal: Will remain free from infection Outcome: Progressing Goal: Diagnostic test results will improve Outcome: Progressing Goal: Respiratory complications will improve Outcome: Progressing Goal: Cardiovascular complication will be avoided Outcome: Progressing   Problem: Activity: Goal: Risk for activity intolerance will decrease Outcome: Progressing   Problem: Nutrition: Goal: Adequate nutrition will be maintained Outcome: Progressing   Problem: Coping: Goal: Level of anxiety will decrease Outcome: Progressing   Problem: Elimination: Goal: Will not experience complications related to bowel motility Outcome: Progressing Goal: Will not experience complications related to urinary retention Outcome: Progressing   Problem: Pain Managment: Goal: General experience of comfort will improve Outcome: Progressing   Problem: Safety: Goal: Ability to remain free from injury will improve Outcome: Progressing   Problem: Skin Integrity: Goal: Risk for impaired skin integrity will decrease Outcome: Progressing   Problem: Education: Goal: Knowledge of General Education information will improve Description: Including pain rating scale, medication(s)/side effects and non-pharmacologic comfort measures Outcome: Progressing   Problem: Health Behavior/Discharge Planning: Goal: Ability to manage health-related needs will improve Outcome: Progressing   Problem: Clinical Measurements: Goal: Ability to maintain  clinical measurements within normal limits will improve Outcome: Progressing Goal: Will remain free from infection Outcome: Progressing Goal: Diagnostic test results will improve Outcome: Progressing Goal: Respiratory complications will improve Outcome: Progressing Goal: Cardiovascular complication will be avoided Outcome: Progressing   Problem: Activity: Goal: Risk for activity intolerance will decrease Outcome: Progressing   Problem: Nutrition: Goal: Adequate nutrition will be maintained Outcome: Progressing   Problem: Coping: Goal: Level of anxiety will decrease Outcome: Progressing   Problem: Elimination: Goal: Will not experience complications related to bowel motility Outcome: Progressing Goal: Will not experience complications related to urinary retention Outcome: Progressing   Problem: Pain Managment: Goal: General experience of comfort will improve Outcome: Progressing   Problem: Safety: Goal: Ability to remain free from injury will improve Outcome: Progressing   Problem: Skin Integrity: Goal: Risk for impaired skin integrity will decrease Outcome: Progressing   Problem: Education: Goal: Verbalization of understanding the information provided (i.e., activity precautions, restrictions, etc) will improve Outcome: Progressing Goal: Individualized Educational Video(s) Outcome: Progressing   Problem: Activity: Goal: Ability to ambulate and perform ADLs will improve Outcome: Progressing   Problem: Clinical Measurements: Goal: Postoperative complications will be avoided or minimized Outcome: Progressing   Problem: Self-Concept: Goal: Ability to maintain and perform role responsibilities to the fullest extent possible will improve Outcome: Progressing   Problem: Pain Management: Goal: Pain level will decrease Outcome: Progressing

## 2022-08-29 NOTE — Plan of Care (Signed)
  Problem: Coping: Goal: Level of anxiety will decrease Outcome: Progressing   Problem: Elimination: Goal: Will not experience complications related to bowel motility Outcome: Progressing Goal: Will not experience complications related to urinary retention Outcome: Progressing   Problem: Pain Managment: Goal: General experience of comfort will improve Outcome: Progressing   

## 2022-08-29 NOTE — Progress Notes (Signed)
Physical Therapy Treatment Patient Details Name: Mallory Peters MRN: [id number] DOB: 11-Oct-1941 Today's Date: 08/29/2022   History of Present Illness 81 year old female with history of dementia and stroke who had an unwitnessed falls and was admitted with right femoral neck hip fracture. S/p right hip hemiarthroplasty (posterior approach) 11/5.    PT Comments    Pt asleep on arrival, did wake to her name but remained relatively lethargic much of the session.  She did not report much pain at rest, but clearly resistant to a lot of movement of the R hip, AAROM exercises b/l with better participation on L but generally limited t/o.  She did make good effort on 3 standing attempts, but could not consistently shift weight forward and onto the walker w/o heavy assist and constant cuing.  Pt continues to be functionally quite limited and did not tolerate WBing on the R well enough to move L LE in standing despite much cuing and encouragement (as well as un-weighting assist).  Recommendations for follow up therapy are one component of a multi-disciplinary discharge planning process, led by the attending physician.  Recommendations may be updated based on patient status, additional functional criteria and insurance authorization.  Follow Up Recommendations  Skilled nursing-short term rehab (<3 hours/day) Can patient physically be transported by private vehicle: No   Assistance Recommended at Discharge Frequent or constant Supervision/Assistance  Patient can return home with the following Two people to help with walking and/or transfers;A lot of help with bathing/dressing/bathroom;Direct supervision/assist for medications management;Assistance with feeding;Assistance with cooking/housework;Assist for transportation;Help with stairs or ramp for entrance   Equipment Recommendations  BSC/3in1;Hospital bed (if she does d/c home)    Recommendations for Other Services       Precautions /  Restrictions Precautions Precautions: Posterior Hip Precaution Booklet Issued: Yes (comment) Restrictions Weight Bearing Restrictions: Yes RLE Weight Bearing: Weight bearing as tolerated     Mobility  Bed Mobility Overal bed mobility: Needs Assistance Bed Mobility: Rolling Rolling: Max assist   Supine to sit: Max assist Sit to supine: Max assist   General bed mobility comments: Max A to get to EOB, to scoot hips forward at EOB amd to return back to supine.  able to give some assist with rolling for BM clean up but limited tolerance    Transfers Overall transfer level: Needs assistance Equipment used: Rolling walker (2 wheels) Transfers: Sit to/from Stand Sit to Stand: Mod assist, Max assist           General transfer comment: Multimodal cuing to initiate STS. Constant assist on sacral area to keep hips forward,  consistently leaning over and only occasoinally (and briefly) able to straighten/ appropriately push up through walker.  Poor tolerance to maintain.  3 seperate standing bouts with 30-90 seconds each before needing to sit for safety reasons    Ambulation/Gait               General Gait Details: did manage to move a R foot a few times with heavy cuing and unweighting assist.  Pt ultimately too limited to actually take any steps, remains hesitant to put much weight through the R to move L.   Stairs             Wheelchair Mobility    Modified Rankin (Stroke Patients Only)       Balance Overall balance assessment: Needs assistance, History of Falls Sitting-balance support: Feet supported, Bilateral upper extremity supported Sitting balance-Leahy Scale: Poor Sitting balance - Comments:  Pt consistently leaning forward despite much cuing (and warning about hip precautions) from daughter and PT.  She did  consistently need heavy assist to maintain more upright sitting     Standing balance-Leahy Scale: Poor Standing balance comment: forward lean,  unable to maintain upright standing position.  Constant cunig and direct assist to maitnain upright - especially with direct assist to keep hips from sitting back on bed                            Cognition Arousal/Alertness: Lethargic Behavior During Therapy: Flat affect Overall Cognitive Status: History of cognitive impairments - at baseline                                 General Comments: Pt inconsistently followed one step commands, difficulty with initiation and sequencing requiring multimodal cues, oriented to name only. Pt reports cognition is more limited at this time compared to baseline        Exercises Total Joint Exercises Ankle Circles/Pumps: 10 reps, AAROM, PROM, AROM Quad Sets: Strengthening, AAROM, 10 reps (Pt inconsisently with ability to initiate quads consistent and multi-modal cuing from PT/daughter.  limited b/l) Heel Slides: PROM, 5 reps (light leg extension resistance b/l when pt able to engage) Hip ABduction/ADduction: AAROM, PROM, 10 reps (inconsistent effort, especially with c/o pain on R)    General Comments        Pertinent Vitals/Pain Pain Assessment Pain Assessment: Faces Faces Pain Scale: Hurts little more    Home Living                          Prior Function            PT Goals (current goals can now be found in the care plan section) Progress towards PT goals: Progressing toward goals    Frequency    7X/week      PT Plan Current plan remains appropriate    Co-evaluation              AM-PAC PT "6 Clicks" Mobility   Outcome Measure  Help needed turning from your back to your side while in a flat bed without using bedrails?: A Lot Help needed moving from lying on your back to sitting on the side of a flat bed without using bedrails?: A Lot Help needed moving to and from a bed to a chair (including a wheelchair)?: Total Help needed standing up from a chair using your arms (e.g., wheelchair  or bedside chair)?: Total Help needed to walk in hospital room?: Total Help needed climbing 3-5 steps with a railing? : Total 6 Click Score: 8    End of Session Equipment Utilized During Treatment: Gait belt Activity Tolerance: Patient limited by fatigue;Patient limited by lethargy Patient left: with bed alarm set;with call bell/phone within reach;with family/visitor present Nurse Communication: Mobility status;Precautions (small BM) PT Visit Diagnosis: Difficulty in walking, not elsewhere classified (R26.2);Muscle weakness (generalized) (M62.81);History of falling (Z91.81) Pain - Right/Left: Right Pain - part of body: Hip     Time: 1355-1425 PT Time Calculation (min) (ACUTE ONLY): 30 min  Charges:  $Therapeutic Exercise: 8-22 mins $Therapeutic Activity: 8-22 mins                     Kreg Shropshire, DPT 08/29/2022, 3:30 PM

## 2022-08-29 NOTE — Progress Notes (Signed)
  PROGRESS NOTE    KENADY DOXTATER  [id number]:[id number] DOB: 01-Dec-1940 DOA: 08/25/2022 PCP: Idelle Crouch, MD  [id number]  LOS: 4 days   Brief hospital course:   Assessment & Plan: Mallory Peters is a 81 year old female with hypertension, dementia, anxiety, depression, who presented emergency department for chief concerns of unwitnessed fall and right hip pain.   Closed displaced fracture of left femoral neck (HCC) status post Right hemiarthroplasty on 08/26/22 --Continue baseline Plavix and aspirin for DVT prophylaxis, per ortho --since pt got too sedated with tramadol, order tylenol 1g and Advil 400 mg PRN for pain.  -HLD (hyperlipidemia) - cont Rosuvastatin 10 mg    Depression and anxiety - continue Zoloft and Xanax prn   Meningioma (HCC) - Appears calcified on CT imaging  Chronic diastolic CHF (congestive heart failure) ([place]) - Echo on 02/17/2020: read as estimated ejection fraction 40 to 44%, grade 1 diastolic dysfunction   Seizures (HCC) - cont home Keppra  - Lorazepam 1 mg IV as needed for seizure   Dementia of the Alzheimer's type ([place]) - cont donepezil      DVT prophylaxis: SCD/Compression stockings Code Status: DNR  Family Communication: daughter updated at bedside today Level of care: Med-Surg Dispo:   The patient is from: home Anticipated d/c is to: SNF rehab Anticipated d/c date is: whenever bed available   Subjective and Interval History:  Per daughter, pt has been eating well and drinking plenty of fluids.  Had BM's.  Pt denied pain.  Daughter said pt got too sedated with just tramadol.   Objective: Vitals:   08/28/22 2251 08/29/22 0911 08/29/22 1531 08/29/22 1534  BP: (!) 104/56 (!) 109/57 (!) 113/57 108/60  Pulse: 85 83 84 90  Resp: '17 18 16 16  '$ Temp: 98.9 F (37.2 C) 98.2 F (36.8 C) 98.3 F (36.8 C) 98.3 F (36.8 C)  TempSrc: Oral     SpO2: 94% 96% 95% 93%  Weight:      Height:        Intake/Output Summary (Last 24 hours)  at 08/29/2022 1744 Last data filed at 08/29/2022 1424 Gross per 24 hour  Intake 120 ml  Output 1800 ml  Net -1680 ml   Filed Weights   08/25/22 1040  Weight: 59 kg    Examination:   Constitutional: NAD, not oriented HEENT: conjunctivae and lids normal, EOMI CV: No cyanosis.   RESP: normal respiratory effort, on RA   Data Reviewed: I have personally reviewed labs and imaging studies  Time spent: 35 minutes  Enzo Bi, MD [facility] If 7PM-7AM, please contact night-coverage 08/29/2022, 5:44 PM

## 2022-08-30 DIAGNOSIS — S72002A Fracture of unspecified part of neck of left femur, initial encounter for closed fracture: Secondary | ICD-10-CM | POA: Diagnosis not present

## 2022-08-30 LAB — CBC
HCT: 23.1 % — ABNORMAL LOW (ref 36.0–46.0)
Hemoglobin: 7.7 g/dL — ABNORMAL LOW (ref 12.0–15.0)
MCH: 28.6 pg (ref 26.0–34.0)
MCHC: 33.3 g/dL (ref 30.0–36.0)
MCV: 85.9 fL (ref 80.0–100.0)
Platelets: 177 10*3/uL (ref 150–400)
RBC: 2.69 MIL/uL — ABNORMAL LOW (ref 3.87–5.11)
RDW: 12.8 % (ref 11.5–15.5)
WBC: 7.6 10*3/uL (ref 4.0–10.5)
nRBC: 0 % (ref 0.0–0.2)

## 2022-08-30 LAB — HEMOGLOBIN AND HEMATOCRIT, BLOOD
HCT: 23.9 % — ABNORMAL LOW (ref 36.0–46.0)
Hemoglobin: 7.8 g/dL — ABNORMAL LOW (ref 12.0–15.0)

## 2022-08-30 LAB — BASIC METABOLIC PANEL
Anion gap: 7 (ref 5–15)
BUN: 12 mg/dL (ref 8–23)
CO2: 27 mmol/L (ref 22–32)
Calcium: 9.1 mg/dL (ref 8.9–10.3)
Chloride: 106 mmol/L (ref 98–111)
Creatinine, Ser: 0.67 mg/dL (ref 0.44–1.00)
GFR, Estimated: >60 mL/min (ref >60)
Glucose, Bld: 99 mg/dL (ref 70–99)
Potassium: 3.8 mmol/L (ref 3.5–5.1)
Sodium: 140 mmol/L (ref 135–145)

## 2022-08-30 LAB — MAGNESIUM: Magnesium: 1.9 mg/dL (ref 1.7–2.4)

## 2022-08-30 NOTE — Plan of Care (Signed)
?  Problem: Nutrition: ?Goal: Adequate nutrition will be maintained ?Outcome: Progressing ?  ?Problem: Elimination: ?Goal: Will not experience complications related to bowel motility ?Outcome: Progressing ?Goal: Will not experience complications related to urinary retention ?Outcome: Progressing ?  ?Problem: Pain Managment: ?Goal: General experience of comfort will improve ?Outcome: Progressing ?  ?  ?

## 2022-08-30 NOTE — Progress Notes (Signed)
Occupational Therapy Treatment Patient Details Name: Mallory Peters MRN: [id number] DOB: May 12, 1941 Today's Date: 08/30/2022   History of present illness 81 year old female with history of dementia and stroke who had an unwitnessed falls and was admitted with right femoral neck hip fracture. S/p right hip hemiarthroplasty (posterior approach) 11/5.   OT comments  Chart reviewed, pt greeted in bed with sister present, agreeable to OT tx session. Co tx completed with PT on this date. Pt si oriented to self only, requires increased time for one step direction following however pleasant throughout. Tx session targeted improving functional activity tolerance for improved participation in ADL tasks. Slow progress is beign made towards goals continued improved tolerance for STS, static standing with at least MOD A +2 at edge of bed with RW. Multi modal cueing required throughout. Pt required MIN A for grooming tasks. Pt is left as received, all needs met. OT will continue to follow acutely.    Recommendations for follow up therapy are one component of a multi-disciplinary discharge planning process, led by the attending physician.  Recommendations may be updated based on patient status, additional functional criteria and insurance authorization.    Follow Up Recommendations  Skilled nursing-short term rehab (<3 hours/day)    Assistance Recommended at Discharge Frequent or constant Supervision/Assistance  Patient can return home with the following  Two people to help with walking and/or transfers;A lot of help with bathing/dressing/bathroom;Assistance with cooking/housework;Assist for transportation;Help with stairs or ramp for entrance;Assistance with feeding;Direct supervision/assist for financial management;Direct supervision/assist for medications management   Equipment Recommendations  BSC/3in1;Hospital bed;Wheelchair (measurements OT)    Recommendations for Other Services       Precautions / Restrictions Precautions Precautions: Posterior Hip Restrictions Weight Bearing Restrictions: Yes RLE Weight Bearing: Weight bearing as tolerated       Mobility Bed Mobility Overal bed mobility: Needs Assistance Bed Mobility: Rolling Rolling: Max assist   Supine to sit: Max assist, Total assist, +2 for physical assistance Sit to supine: Max assist, +2 for physical assistance   General bed mobility comments: TOTAL A +2 for boost in bed    Transfers Overall transfer level: Needs assistance Equipment used: Rolling walker (2 wheels) Transfers: Sit to/from Stand Sit to Stand: Mod assist, Max assist (MAX +1 one attempt (with +2 for RW management), MOD-MAX +2 two attempts, muti modal cueing required for for STS)           General transfer comment: posterior lean throughout standing tasks     Balance Overall balance assessment: Needs assistance, History of Falls Sitting-balance support: Feet supported, Bilateral upper extremity supported Sitting balance-Leahy Scale: Fair     Standing balance support: Bilateral upper extremity supported, Reliant on assistive device for balance Standing balance-Leahy Scale: Poor                             ADL either performed or assessed with clinical judgement   ADL Overall ADL's : Needs assistance/impaired                                       General ADL Comments: TOTAL A for peri care, MIN A for grooming tasks seated on edge of the bed, MAX A for LB dressing    Extremity/Trunk Assessment              Vision  Perception     Praxis      Cognition Arousal/Alertness: Awake/alert Behavior During Therapy: Anxious Overall Cognitive Status: Impaired/Different from baseline Area of Impairment: Orientation, Attention, Memory, Following commands, Safety/judgement, Awareness, Problem solving                 Orientation Level: Disoriented to, Place, Time, Situation Current  Attention Level: Focused Memory: Decreased short-term memory Following Commands: Follows one step commands with increased time Safety/Judgement: Decreased awareness of deficits, Decreased awareness of safety Awareness: Intellectual Problem Solving: Slow processing, Decreased initiation, Difficulty sequencing, Requires verbal cues, Requires tactile cues General Comments: sister endorses cognition is not at baseline although pt does have baseline cognitive deficits        Exercises      Shoulder Instructions       General Comments      Pertinent Vitals/ Pain       Pain Assessment Pain Assessment: Faces Faces Pain Scale: Hurts little more Pain Location: R hip Pain Descriptors / Indicators: Guarding, Grimacing Pain Intervention(s): Limited activity within patient's tolerance, Monitored during session, Repositioned, Ice applied  Home Living                                          Prior Functioning/Environment              Frequency  Min 2X/week        Progress Toward Goals  OT Goals(current goals can now be found in the care plan section)  Progress towards OT goals: Progressing toward goals     Plan Discharge plan remains appropriate;Frequency remains appropriate    Co-evaluation    PT/OT/SLP Co-Evaluation/Treatment: Yes Reason for Co-Treatment: Complexity of the patient's impairments (multi-system involvement);Necessary to address cognition/behavior during functional activity;For patient/therapist safety;To address functional/ADL transfers   OT goals addressed during session: ADL's and self-care      AM-PAC OT "6 Clicks" Daily Activity     Outcome Measure   Help from another person eating meals?: None Help from another person taking care of personal grooming?: A Little Help from another person toileting, which includes using toliet, bedpan, or urinal?: A Lot Help from another person bathing (including washing, rinsing, drying)?: A  Lot Help from another person to put on and taking off regular upper body clothing?: A Lot Help from another person to put on and taking off regular lower body clothing?: A Lot 6 Click Score: 15    End of Session Equipment Utilized During Treatment: Rolling walker (2 wheels);Gait belt  OT Visit Diagnosis: Other abnormalities of gait and mobility (R26.89);Muscle weakness (generalized) (M62.81);Pain   Activity Tolerance Patient tolerated treatment well   Patient Left in bed;with call bell/phone within reach;with bed alarm set;with family/visitor present   Nurse Communication Mobility status        Time: 4920-1007 OT Time Calculation (min): 24 min  Charges: OT General Charges $OT Visit: 1 Visit OT Treatments $Therapeutic Activity: 8-22 mins  Shanon Payor, OTD OTR/L  08/30/22, 11:53 AM

## 2022-08-30 NOTE — Progress Notes (Signed)
Physical Therapy Treatment Patient Details Name: Mallory Peters MRN: [id number] DOB: Mar 10, 1941 Today's Date: 08/30/2022   History of Present Illness 81 year old female with history of dementia and stroke who had an unwitnessed falls and was admitted with right femoral neck hip fracture. S/p right hip hemiarthroplasty (posterior approach) 11/5.    PT Comments    Patient is agreeable to PT and OT with encouragement. She continues to require significant assistance for bed mobility and transfers. 3 standing bouts performed with limited standing tolerance. She was able to take 1-2 small side steps to the right with RLE with difficulty with weight acceptance on RLE for advancement of LLE. Continue to recommend SNF placement at discharge. PT will continue to follow to maximize independence and decrease caregiver burden.    Recommendations for follow up therapy are one component of a multi-disciplinary discharge planning process, led by the attending physician.  Recommendations may be updated based on patient status, additional functional criteria and insurance authorization.  Follow Up Recommendations  Skilled nursing-short term rehab (<3 hours/day) Can patient physically be transported by private vehicle: No   Assistance Recommended at Discharge Frequent or constant Supervision/Assistance  Patient can return home with the following Two people to help with walking and/or transfers;A lot of help with bathing/dressing/bathroom;Direct supervision/assist for medications management;Assistance with feeding;Assistance with cooking/housework;Assist for transportation;Help with stairs or ramp for entrance   Equipment Recommendations  BSC/3in1;Hospital bed (if going home from the hospital)    Recommendations for Other Services       Precautions / Restrictions Precautions Precautions: Posterior Hip Restrictions Weight Bearing Restrictions: Yes RLE Weight Bearing: Weight bearing as tolerated      Mobility  Bed Mobility Overal bed mobility: Needs Assistance       Supine to sit: Max assist Sit to supine: +2 for physical assistance   General bed mobility comments: verbal cues for sequencing, task initiation, and technique. increased time and effort required. total +2 for boosting up in the bed    Transfers Overall transfer level: Needs assistance Equipment used: Rolling walker (2 wheels) Transfers: Sit to/from Stand Sit to Stand: Mod assist, Max assist, +2 physical assistance           General transfer comment: 3 standing bouts performed from bed. assistance varried from Max A +2 or Mod A +2. faciliation for lifting off and hip extension.    Ambulation/Gait             Pre-gait activities: the patient is able to take 2 side steps to the right with RLE, however has difficulty with weight acceptance on the right, despite faciliation, for advancement of LLE. unable to take steps forward. +2 person assistance provided     Stairs             Wheelchair Mobility    Modified Rankin (Stroke Patients Only)       Balance Overall balance assessment: Needs assistance Sitting-balance support: Feet supported, Bilateral upper extremity supported Sitting balance-Leahy Scale: Poor   Postural control: Right lateral lean, Posterior lean Standing balance support: Bilateral upper extremity supported Standing balance-Leahy Scale: Poor Standing balance comment: external support required                            Cognition Arousal/Alertness: Awake/alert Behavior During Therapy: Anxious Overall Cognitive Status: History of cognitive impairments - at baseline  General Comments: sister reports patient is not at her baseline for cogition, seems a bit more confused. the patient is able to follow single step comands with increased time. slow processing and cues required for sequencing        Exercises       General Comments        Pertinent Vitals/Pain Pain Assessment Pain Assessment: Faces Faces Pain Scale: Hurts little more Pain Location: R hip Pain Descriptors / Indicators: Guarding, Grimacing Pain Intervention(s): Limited activity within patient's tolerance, Monitored during session    Home Living                          Prior Function            PT Goals (current goals can now be found in the care plan section) Acute Rehab PT Goals Patient Stated Goal: family goal is to go home eventually PT Goal Formulation: With family Time For Goal Achievement: 09/10/22 Potential to Achieve Goals: Fair Progress towards PT goals: Progressing toward goals    Frequency    7X/week      PT Plan Current plan remains appropriate    Co-evaluation PT/OT/SLP Co-Evaluation/Treatment: Yes Reason for Co-Treatment: Complexity of the patient's impairments (multi-system involvement);Necessary to address cognition/behavior during functional activity;For patient/therapist safety;To address functional/ADL transfers PT goals addressed during session: Mobility/safety with mobility OT goals addressed during session: ADL's and self-care      AM-PAC PT "6 Clicks" Mobility   Outcome Measure  Help needed turning from your back to your side while in a flat bed without using bedrails?: A Lot Help needed moving from lying on your back to sitting on the side of a flat bed without using bedrails?: A Lot Help needed moving to and from a bed to a chair (including a wheelchair)?: Total Help needed standing up from a chair using your arms (e.g., wheelchair or bedside chair)?: Total Help needed to walk in hospital room?: Total Help needed climbing 3-5 steps with a railing? : Total 6 Click Score: 8    End of Session Equipment Utilized During Treatment: Gait belt Activity Tolerance: Patient limited by fatigue Patient left: in bed;with call bell/phone within reach;with bed alarm set;with  family/visitor present (sister present) Nurse Communication: Mobility status PT Visit Diagnosis: Difficulty in walking, not elsewhere classified (R26.2);Muscle weakness (generalized) (M62.81);History of falling (Z91.81) Pain - Right/Left: Right Pain - part of body: Hip     Time: 7017-7939 PT Time Calculation (min) (ACUTE ONLY): 23 min  Charges:  $Therapeutic Activity: 8-22 mins                     Minna Merritts, PT, MPT    Mallory Peters 08/30/2022, 11:58 AM

## 2022-08-30 NOTE — Progress Notes (Signed)
  PROGRESS NOTE    Mallory Peters  LDJ:[id number] DOB: 1941/07/28 DOA: 08/25/2022 PCP: Idelle Crouch, MD  [id number]  LOS: 5 days   Brief hospital course:   Assessment & Plan: Mallory Peters is a 81 year old female with hypertension, dementia, anxiety, depression, who presented emergency department for chief concerns of unwitnessed fall and right hip pain.   Closed displaced fracture of left femoral neck (HCC) status post Right hemiarthroplasty on 08/26/22 --Continue baseline Plavix and aspirin for DVT prophylaxis, per ortho --since pt got too sedated with tramadol, order tylenol 1g and Advil 400 mg PRN for pain. --weightbearing as tolerated on the right lower extremity but will continue to observe posterior hip precautions for several months postop.  --f/u with ortho Dr. Mack Guise in 2 weeks  -HLD (hyperlipidemia) - cont Rosuvastatin 10 mg    Depression and anxiety - continue Zoloft and Xanax prn   Meningioma ([place]) - Appears calcified on CT imaging  Chronic diastolic CHF (congestive heart failure) ([place]) - Echo on 02/17/2020: read as estimated ejection fraction 40 to 03%, grade 1 diastolic dysfunction   Seizures (HCC) - cont home Keppra  - Lorazepam 1 mg IV as needed for seizure   Dementia of the Alzheimer's type ([place]) - cont donepezil      DVT prophylaxis: SCD/Compression stockings Code Status: DNR  Family Communication: daughter updated at bedside today Level of care: Med-Surg Dispo:   The patient is from: home Anticipated d/c is to: SNF rehab Anticipated d/c date is: whenever bed available   Subjective and Interval History:  Pt was more alert, and in better mood today.   Objective: Vitals:   08/29/22 1534 08/30/22 0638 08/30/22 0841 08/30/22 1530  BP: 108/60 111/63 (!) 116/57 (!) 116/54  Pulse: 90 78 82 86  Resp: '16 20 17 16  '$ Temp: 98.3 F (36.8 C) 98.4 F (36.9 C) 99 F (37.2 C) 98.1 F (36.7 C)  TempSrc:      SpO2: 93% 91% 99% 95%   Weight:      Height:        Intake/Output Summary (Last 24 hours) at 08/30/2022 1842 Last data filed at 08/30/2022 1530 Gross per 24 hour  Intake 240 ml  Output 1350 ml  Net -1110 ml   Filed Weights   08/25/22 1040  Weight: 59 kg    Examination:   Constitutional: NAD, alert, smiling, oriented to person HEENT: conjunctivae and lids normal, EOMI CV: No cyanosis.   RESP: normal respiratory effort, on RA SKIN: warm, dry Neuro: II - XII grossly intact.     Data Reviewed: I have personally reviewed labs and imaging studies  Time spent: 25 minutes  Enzo Bi, MD [facility] If 7PM-7AM, please contact night-coverage 08/30/2022, 6:42 PM

## 2022-08-30 NOTE — Progress Notes (Signed)
Patient refused to dangle on side of bed this a.m.  Will continue to attempt to have patient sit on side of bed throughout day.

## 2022-08-30 NOTE — Progress Notes (Signed)
Subjective:  POD #4 s/p right hip hemiarthroplasty.  Patient has a history of dementia.  She is just finished physical therapy this morning.  Patient seems confused.  Her sister is at bedside but her daughter is not in the room.  Patient does not indicate that she is having significant right hip pain.  She has difficulty giving an accurate history.  Patient is in no acute distress.  She is lying in her bed.  Objective:   VITALS:   Vitals:   08/29/22 1531 08/29/22 1534 08/30/22 0638 08/30/22 0841  BP: (!) 113/57 108/60 111/63 (!) 116/57  Pulse: 84 90 78 82  Resp: '16 16 20 17  '$ Temp: 98.3 F (36.8 C) 98.3 F (36.8 C) 98.4 F (36.9 C) 99 F (37.2 C)  TempSrc:      SpO2: 95% 93% 91% 99%  Weight:      Height:        PHYSICAL EXAM: Right lower extremity Patient's dressing has already been changed by the nursing staff.  There is no spotting or drainage on the dressing today.  Patient's thigh is soft and compressible.  There is no erythema or ecchymosis.  She is neurovascular intact and has intact motor function of her ankle.   LABS  Results for orders placed or performed during the hospital encounter of 08/25/22 (from the past 24 hour(s))  Basic metabolic panel     Status: None   Collection Time: 08/30/22  2:58 AM  Result Value Ref Range   Sodium 140 135 - 145 mmol/L   Potassium 3.8 3.5 - 5.1 mmol/L   Chloride 106 98 - 111 mmol/L   CO2 27 22 - 32 mmol/L   Glucose, Bld 99 70 - 99 mg/dL   BUN 12 8 - 23 mg/dL   Creatinine, Ser 0.67 0.44 - 1.00 mg/dL   Calcium 9.1 8.9 - 10.3 mg/dL   GFR, Estimated >60 >60 mL/min   Anion gap 7 5 - 15  Magnesium     Status: None   Collection Time: 08/30/22  2:58 AM  Result Value Ref Range   Magnesium 1.9 1.7 - 2.4 mg/dL  CBC     Status: Abnormal   Collection Time: 08/30/22  2:58 AM  Result Value Ref Range   WBC 7.6 4.0 - 10.5 K/uL   RBC 2.69 (L) 3.87 - 5.11 MIL/uL   Hemoglobin 7.7 (L) 12.0 - 15.0 g/dL   HCT 23.1 (L) 36.0 - 46.0 %   MCV 85.9  80.0 - 100.0 fL   MCH 28.6 26.0 - 34.0 pg   MCHC 33.3 30.0 - 36.0 g/dL   RDW 12.8 11.5 - 15.5 %   Platelets 177 150 - 400 K/uL   nRBC 0.0 0.0 - 0.2 %  Hemoglobin and hematocrit, blood     Status: Abnormal   Collection Time: 08/30/22 10:05 AM  Result Value Ref Range   Hemoglobin 7.8 (L) 12.0 - 15.0 g/dL   HCT 23.9 (L) 36.0 - 46.0 %    No results found.  Assessment/Plan: 4 Days Post-Op   Principal Problem:   Closed displaced fracture of left femoral neck (HCC) Active Problems:   Dementia of the Alzheimer's type (HCC)   Seizures (HCC)   Chronic diastolic CHF (congestive heart failure) (HCC)   Meningioma (HCC)   Depression   HLD (hyperlipidemia)  Patient is doing well from an orthopedic standpoint.  Patient is awaiting skilled nursing facility placement.  We will continue with physical therapy as tolerated.  May  be discharged to a skilled nursing facility from an orthopedic standpoint when she is cleared medically.  I recommend she continue taking her baseline Plavix with aspirin 81 mg twice a day for DVT prophylaxis until her return to see me in the office in 2 weeks.  Patient is weightbearing as tolerated on the right lower extremity but will continue to observe posterior hip precautions for several months postop.    Thornton Park , MD 08/30/2022, 11:25 AM

## 2022-08-30 NOTE — Progress Notes (Signed)
Daughter reports that the amount of drainage in surgical dressing is more than it was  the day before and a small amount had leaked to the side. No active drainage at this time BP 111/63 (BP Location: Left Arm)   Pulse 78   Temp 98.4 F (36.9 C)   Resp 20   Ht '5\' 2"'$  (1.575 m)   Wt 59 kg   SpO2 91%   BMI 23.78 kg/m

## 2022-08-30 NOTE — Progress Notes (Signed)
       CROSS COVER NOTE  NAME: Mallory Peters MRN: [id number] DOB : 03-Jun-1941    Time of Service   0630  HPI/Events of Note   Nurse reported right hip hemiarthroplasty hydrocollid dressing saturated with blood. VS requested  Assessment and  Interventions   HGB at  0258 this am stable at 7.7 and normal with normal heart rates   Plan: Obtain current vital signs.  Repeat hemoglobin every 6 h x2 Transfuse if becomes symptomatic or hgb drops below 7.o Notify ortho of bleeding      Kathlene Cote NP [facility]

## 2022-08-30 NOTE — TOC Transition Note (Signed)
Transition of Care [facility]) - CM/SW Discharge Note   Patient Details  Name: Mallory Peters MRN: [id number] Date of Birth: 07-Dec-1940  Transition of Care [facility]) CM/SW Contact:  Conception Oms, RN Phone Number: 08/30/2022, 4:28 PM   Clinical Narrative:    Received the PASSR number and reviewed the beds with the daughter PASSR number is [id number]  Daughter will review the bed offers and let me know a choice       Patient Goals and CMS Choice        Discharge Placement                       Discharge Plan and Services                                     Social Determinants of Health (SDOH) Interventions     Readmission Risk Interventions     No data to display

## 2022-08-31 DIAGNOSIS — S72002A Fracture of unspecified part of neck of left femur, initial encounter for closed fracture: Secondary | ICD-10-CM | POA: Diagnosis not present

## 2022-08-31 LAB — BASIC METABOLIC PANEL
Anion gap: 6 (ref 5–15)
BUN: 13 mg/dL (ref 8–23)
CO2: 27 mmol/L (ref 22–32)
Calcium: 8.8 mg/dL — ABNORMAL LOW (ref 8.9–10.3)
Chloride: 106 mmol/L (ref 98–111)
Creatinine, Ser: 0.52 mg/dL (ref 0.44–1.00)
GFR, Estimated: >60 mL/min (ref >60)
Glucose, Bld: 89 mg/dL (ref 70–99)
Potassium: 3.8 mmol/L (ref 3.5–5.1)
Sodium: 139 mmol/L (ref 135–145)

## 2022-08-31 LAB — CBC
HCT: 24.2 % — ABNORMAL LOW (ref 36.0–46.0)
Hemoglobin: 7.9 g/dL — ABNORMAL LOW (ref 12.0–15.0)
MCH: 28.2 pg (ref 26.0–34.0)
MCHC: 32.6 g/dL (ref 30.0–36.0)
MCV: 86.4 fL (ref 80.0–100.0)
Platelets: 207 10*3/uL (ref 150–400)
RBC: 2.8 MIL/uL — ABNORMAL LOW (ref 3.87–5.11)
RDW: 12.8 % (ref 11.5–15.5)
WBC: 6.6 10*3/uL (ref 4.0–10.5)
nRBC: 0 % (ref 0.0–0.2)

## 2022-08-31 LAB — MAGNESIUM: Magnesium: 2.1 mg/dL (ref 1.7–2.4)

## 2022-08-31 MED ORDER — CYANOCOBALAMIN 1000 MCG PO TABS: 1000.0000 ug | ORAL_TABLET | Freq: Every day | ORAL | Status: AC

## 2022-08-31 MED ORDER — VITAMIN D3 25 MCG PO TABS: 2000.0000 [IU] | ORAL_TABLET | Freq: Every day | ORAL | Status: AC

## 2022-08-31 MED ORDER — POLYETHYLENE GLYCOL 3350 17 G PO PACK: 17.0000 g | PACK | Freq: Every day | ORAL | 0 refills | Status: AC | PRN

## 2022-08-31 MED ORDER — ACETAMINOPHEN 500 MG PO TABS: 1000.0000 mg | ORAL_TABLET | Freq: Four times a day (QID) | ORAL | 0 refills | Status: AC | PRN

## 2022-08-31 MED ORDER — POTASSIUM CHLORIDE CRYS ER 10 MEQ PO TBCR: 20.0000 meq | EXTENDED_RELEASE_TABLET | Freq: Every day | ORAL | Status: AC

## 2022-08-31 MED ORDER — ASPIRIN 81 MG PO TBEC: 81.0000 mg | DELAYED_RELEASE_TABLET | Freq: Two times a day (BID) | ORAL | Status: AC

## 2022-08-31 MED ORDER — SENNOSIDES-DOCUSATE SODIUM 8.6-50 MG PO TABS: 1.0000 | ORAL_TABLET | Freq: Every evening | ORAL | Status: AC | PRN

## 2022-08-31 NOTE — TOC Progression Note (Signed)
Transition of Care [facility]) - Progression Note    Patient Details  Name: TAYLA PANOZZO MRN: [id number] Date of Birth: 1941-03-24  Transition of Care [facility]) CM/SW [place], RN Phone Number: 08/31/2022, 9:27 AM  Clinical Narrative:    Spoke with the patient's daughter in the room and asked if she had reviewed the bed offers I provided her yesterday, she asked me if they would allow her to stay with the patient, I explained that would be entirely up to the facility but she could certainly request to do so with the facility,  She asked about going home with Home health, I explained that she could do that as well and explained that [facility] does not come out daily, She stated understanding  She stated that her husband would have to come down and build a ramp and that they needed to get the living room set up as a bedroom, I mentioned that it sounded like that would take some time to get done, she stated that yes it would, I explained that when a patient is medically ready that they can not stay in the hospital for that reason  That perhaps while the patient was in rehab  that they would be ab le to get the home ready, I explained that she has already been here several days She stated that she would appeal to medicare I explained that She does have appeal rights but that it takes into consideration medical readiness. She stated that she would call the facilities and see if they would allow her to stay with the patient and let me know the plan       Expected Discharge Plan and Services                                                 Social Determinants of Health (SDOH) Interventions    Readmission Risk Interventions     No data to display

## 2022-08-31 NOTE — Progress Notes (Signed)
Report called to Irving Burton, RN, at [facility].  Awaiting EMS transport.

## 2022-08-31 NOTE — TOC Progression Note (Signed)
Transition of Care [facility]) - Progression Note    Patient Details  Name: Mallory Peters MRN: [id number] Date of Birth: [birth year]  Transition of Care [facility]) CM/SW [place], RN Phone Number: 08/31/2022, 11:51 AM  Clinical Narrative:    Met with the patient's daughter in the room, she decided to accept the bed offer from [place] notified Hilda Blades at [facility]          Expected Discharge Plan and Services                                                 Social Determinants of Health (SDOH) Interventions    Readmission Risk Interventions     No data to display

## 2022-08-31 NOTE — Care Management Important Message (Signed)
Important Message  Patient Details  Name: Mallory Peters MRN: [id number] Date of Birth: 1941/05/24   Medicare Important Message Given:  Yes     Juliann Pulse A Kaylon Laroche 08/31/2022, 12:04 PM

## 2022-08-31 NOTE — Plan of Care (Signed)
  Problem: Clinical Measurements: Goal: Will remain free from infection Outcome: Progressing Goal: Diagnostic test results will improve Outcome: Progressing Goal: Respiratory complications will improve Outcome: Progressing Goal: Cardiovascular complication will be avoided Outcome: Progressing   Problem: Activity: Goal: Risk for activity intolerance will decrease Outcome: Progressing   Problem: Nutrition: Goal: Adequate nutrition will be maintained Outcome: Progressing   Problem: Elimination: Goal: Will not experience complications related to bowel motility Outcome: Progressing Goal: Will not experience complications related to urinary retention Outcome: Progressing   Problem: Pain Managment: Goal: General experience of comfort will improve Outcome: Progressing

## 2022-08-31 NOTE — Discharge Summary (Signed)
Physician Discharge Summary   Mallory Peters  female DOB: 1941/09/19  HDQ:[id number]  PCP: Idelle Crouch, MD  Admit date: 08/25/2022 Discharge date: 08/31/2022  Admitted From: home Disposition:  SNF rehab Daughter updated at bedside prior to discharge. CODE STATUS: DNR  Discharge Instructions     No wound care   Complete by: As directed       Hospital Course:  For full details, please see H&P, progress notes, consult notes and ancillary notes.  Briefly,  Mallory Peters is a 81 year old female with hypertension, advanced dementia, anxiety, depression, who presented emergency department for chief concerns of unwitnessed fall and right hip pain.   Closed displaced fracture of left femoral neck (HCC) S/p Right hemiarthroplasty on 08/26/22 --Continue aspirin 81 BID for 14 days for DVT prophylaxis until outpatient ortho followup in 2 weeks.  Also continue home Plavix. --since pt got too sedated with tramadol, order tylenol 1g PRN for pain. --weightbearing as tolerated on the right lower extremity but will continue to observe posterior hip precautions for several months postop.  --f/u with ortho Dr. Mack Guise in 2 weeks   Acute anemia 2/2 blood loss and acute illness --Hgb 13.6 on presentation, dropped to 7.7.  Hgb stable around 7's for several days prior to discharge.  Since pt was not anemic PTA, expect Hgb to improve as pt recovers.  Diarrhea --due to excess scheduled daily Senokot and Colace.  Changed both to PRN.    -HLD (hyperlipidemia) - cont Rosuvastatin 10 mg    Depression and anxiety - continue Zoloft and Xanax prn   Meningioma (HCC) - Appears calcified on CT imaging   Chronic diastolic CHF (congestive heart failure) (HCC) - stable.  Echo on 02/17/2020: read as estimated ejection fraction 40 to 11%, grade 1 diastolic dysfunction   Seizures (HCC) - cont home Keppra    Dementia of the Alzheimer's type ([place]) - cont donepezil     Hypokalemia --monitored and repleted PRN. --consolidated home potassium 10 mEq BID to 20 mEq daily.   Discharge Diagnoses:  Principal Problem:   Closed displaced fracture of left femoral neck (HCC) Active Problems:   Dementia of the Alzheimer's type (HCC)   Seizures (HCC)   Chronic diastolic CHF (congestive heart failure) (HCC)   Meningioma (HCC)   Depression   HLD (hyperlipidemia)   30 Day Unplanned Readmission Risk Score    Flowsheet Row ED to Hosp-Admission (Current) from 08/25/2022 in [facility] (1A)  30 Day Unplanned Readmission Risk Score (%) 14 Filed at 08/31/2022 0801       This score is the patient's risk of an unplanned readmission within 30 days of being discharged (0 -100%). The score is based on dignosis, age, lab data, medications, orders, and past utilization.   Low:  0-14.9   Medium: 15-21.9   High: 22-29.9   Extreme: 30 and above         Discharge Instructions:  Allergies as of 08/31/2022       Reactions   Ciprofloxacin Other (See Comments)   Hallucinations   Metronidazole Other (See Comments)   Hallucinations   Norco [hydrocodone-acetaminophen]    Drowsy and confused        Medication List     TAKE these medications    acetaminophen 500 MG tablet Commonly known as: TYLENOL Take 2 tablets (1,000 mg total) by mouth every 6 (six) hours as needed for mild pain or moderate pain.   ALPRAZolam 0.25 MG tablet Commonly known as:  XANAX Take 0.25 mg by mouth at bedtime as needed.   aspirin EC 81 MG tablet Take 1 tablet (81 mg total) by mouth 2 (two) times daily for 14 days. Swallow whole.   clopidogrel 75 MG tablet Commonly known as: PLAVIX Take 75 mg by mouth daily.   cyanocobalamin 1000 MCG tablet Take 1 tablet (1,000 mcg total) by mouth daily. Start taking on: September 01, 2022   donepezil 10 MG tablet Commonly known as: ARICEPT Take 10 mg by mouth at bedtime.   levETIRAcetam 100 MG/ML  solution Commonly known as: KEPPRA Take 7.5 mLs by mouth 2 (two) times daily.   midodrine 2.5 MG tablet Commonly known as: PROAMATINE Take 2.5 mg by mouth 2 (two) times daily.   modafinil 100 MG tablet Commonly known as: PROVIGIL Take 100 mg by mouth daily.   polyethylene glycol 17 g packet Commonly known as: MIRALAX / GLYCOLAX Take 17 g by mouth daily as needed for mild constipation.   potassium chloride 10 MEQ tablet Commonly known as: KLOR-CON M Take 2 tablets (20 mEq total) by mouth daily. What changed:  how much to take when to take this   risperiDONE 0.25 MG tablet Commonly known as: RISPERDAL Take 0.25-0.5 mg by mouth daily as needed (agitation).   rosuvastatin 10 MG tablet Commonly known as: CRESTOR Take 10 mg by mouth daily.   senna-docusate 8.6-50 MG tablet Commonly known as: Senokot-S Take 1 tablet by mouth at bedtime as needed for mild constipation.   sertraline 25 MG tablet Commonly known as: ZOLOFT Take 75 mg by mouth at bedtime.   vitamin D3 25 MCG tablet Commonly known as: CHOLECALCIFEROL Take 2 tablets (2,000 Units total) by mouth daily. Start taking on: September 01, 2022         Contact information for follow-up providers     Thornton Park, MD Follow up in 2 week(s).   Specialty: Orthopedic Surgery Contact information: [place] [address] [phone]         Idelle Crouch, MD Follow up.   Specialty: Internal Medicine Contact information: [place] [address] [phone]              Contact information for after-discharge care     Destination     [facility] .   Service: Skilled Nursing Contact information: [address] [place] [phone]                     Allergies  Allergen Reactions   Ciprofloxacin Other (See Comments)    Hallucinations   Metronidazole Other  (See Comments)    Hallucinations   Norco [Hydrocodone-Acetaminophen]     Drowsy and confused     The results of significant diagnostics from this hospitalization (including imaging, microbiology, ancillary and laboratory) are listed below for reference.   Consultations:   Procedures/Studies: DG Hip Port Unilat With Pelvis 1V Right  Result Date: 08/26/2022 CLINICAL DATA:  Hip fracture, postop EXAM: DG HIP (WITH OR WITHOUT PELVIS) 1V PORT RIGHT COMPARISON:  08/25/2022 FINDINGS: Right hip hemiarthroplasty. No hardware or bony complicating feature. Normal AP alignment. IMPRESSION: Right hip hemiarthroplasty.  No complicating feature. Electronically Signed   By: Rolm Baptise M.D.   On: 08/26/2022 11:42   Chest Portable 1 View  Result Date: 08/25/2022 CLINICAL DATA:  Dementia, post fall. EXAM: PORTABLE CHEST 1 VIEW COMPARISON:  August 25, 2022 CT of the  chest, abdomen and pelvis FINDINGS: EKG leads project over the chest. Cardiomediastinal contours and hilar structures are stable. Lungs are clear.  No pneumothorax. Mild curvature of the spine with levo convexity at the thoracolumbar junction. No acute skeletal process on limited assessment. IMPRESSION: No acute cardiopulmonary disease. Electronically Signed   By: Zetta Bills M.D.   On: 08/25/2022 13:40   CT CHEST ABDOMEN PELVIS W CONTRAST  Result Date: 08/25/2022 CLINICAL DATA:  Unwitnessed fall. Pain with right hip and leg movement. Additional non localized pain. EXAM: CT CHEST, ABDOMEN, AND PELVIS WITH CONTRAST TECHNIQUE: Multidetector CT imaging of the chest, abdomen and pelvis was performed following the standard protocol during bolus administration of intravenous contrast. RADIATION DOSE REDUCTION: This exam was performed according to the departmental dose-optimization program which includes automated exposure control, adjustment of the mA and/or kV according to patient size and/or use of iterative reconstruction technique. CONTRAST:  152m  OMNIPAQUE IOHEXOL 300 MG/ML  SOLN COMPARISON:  Right hip radiographs obtained today. Portable chest dated 02/18/2020. Abdomen and pelvis CT dated 10/29/2012. FINDINGS: CT CHEST FINDINGS Cardiovascular: Mild aortic atheromatous changes. Borderline enlarged heart. Mediastinum/Nodes: No enlarged mediastinal, hilar, or axillary lymph nodes. Thyroid gland, trachea, and esophagus demonstrate no significant findings. Lungs/Pleura: Bilateral dependent atelectasis. Minimal centrilobular bullous changes. No pleural fluid or pneumothorax. Musculoskeletal: Mild thoracolumbar scoliosis. Thoracic spine degenerative changes. Severe left glenohumeral joint degenerative changes. No fracture or subluxation. CT ABDOMEN PELVIS FINDINGS Hepatobiliary: Multiple simple liver cysts are again demonstrated. These do not need imaging follow-up. Cholecystectomy clips with expected post cholecystectomy intrahepatic biliary ductal dilatation. Pancreas: Unremarkable. No pancreatic ductal dilatation or surrounding inflammatory changes. Spleen: Normal in size without focal abnormality. Adrenals/Urinary Tract: Normal-appearing adrenal glands. Tiny bilateral renal cysts without significant change. Unremarkable ureters and urinary bladder. Stomach/Bowel: Mild sigmoid and descending colon diverticulosis without evidence of diverticulitis. Unremarkable stomach, small bowel and appendix. Vascular/Lymphatic: Atheromatous arterial calcifications without aneurysm. No enlarged lymph nodes. Reproductive: Uterus and left adnexa are unremarkable. The previously demonstrated 0.9 cm right ovarian cyst has peripheral wall calcifications and medium density and low-density components internally with an appearance suggesting a small dermoid. This currently measures 2.4 cm in maximum diameter previously measured 2.2 cm in corresponding diameter. Other: No abdominal wall hernia or abnormality. No abdominopelvic ascites. Musculoskeletal: Right femoral neck fracture with  mild anterior and proximal displacement of the distal fragment. Lumbar spine degenerative changes. No lumbar spine fractures or subluxations. IMPRESSION: 1. Right femoral neck fracture with mild anterior and proximal displacement of the distal fragment. 2. No acute abnormality in the chest, abdomen or pelvis. 3. Mild changes of COPD. 4. Mild sigmoid and descending colon diverticulosis. 5. 2.4 cm right ovarian dermoid with little change since 2014. This does not need imaging follow-up. Aortic Atherosclerosis (ICD10-I70.0). Electronically Signed   By: SClaudie ReveringM.D.   On: 08/25/2022 12:03   CT Head Wo Contrast  Result Date: 08/25/2022 CLINICAL DATA:  Non localized pain following an unwitnessed fall. EXAM: CT HEAD WITHOUT CONTRAST CT CERVICAL SPINE WITHOUT CONTRAST TECHNIQUE: Multidetector CT imaging of the head and cervical spine was performed following the standard protocol without intravenous contrast. Multiplanar CT image reconstructions of the cervical spine were also generated. RADIATION DOSE REDUCTION: This exam was performed according to the departmental dose-optimization program which includes automated exposure control, adjustment of the mA and/or kV according to patient size and/or use of iterative reconstruction technique. COMPARISON:  Head CT dated 04/11/2022 FINDINGS: CT HEAD FINDINGS Brain: Stable moderately enlarged ventricles and subarachnoid spaces. Stable  moderate patchy white matter low density in both cerebral hemispheres. Stable densely calcified meningioma in the left lateral frontoparietal region. This currently measures 1.5 cm in maximum diameter, previously 1.4 cm in corresponding diameter. No intracranial hemorrhage, new mass lesion or CT evidence of acute infarction. Vascular: No hyperdense vessel or unexpected calcification. Skull: Normal. Negative for fracture or focal lesion. Sinuses/Orbits: Unremarkable. Other: None. CT CERVICAL SPINE FINDINGS Alignment: Normal. Skull base and  vertebrae: No acute fracture. No primary bone lesion or focal pathologic process. Soft tissues and spinal canal: No prevertebral fluid or swelling. No visible canal hematoma. Disc levels:  Multilevel degenerative changes. Upper chest: Clear lung apices. Other: Minimal bilateral carotid artery calcification. IMPRESSION: 1. No skull fracture or intracranial hemorrhage. 2. No cervical spine fracture or subluxation. 3. Stable moderate diffuse cerebral and cerebellar atrophy and moderate chronic small vessel white matter ischemic changes in both cerebral hemispheres. 4. Stable densely calcified meningioma in the left lateral frontoparietal region. 5. Multilevel cervical spine degenerative changes. 6. Minimal bilateral carotid artery atheromatous calcification. Electronically Signed   By: Claudie Revering M.D.   On: 08/25/2022 11:43   CT Cervical Spine Wo Contrast  Result Date: 08/25/2022 CLINICAL DATA:  Non localized pain following an unwitnessed fall. EXAM: CT HEAD WITHOUT CONTRAST CT CERVICAL SPINE WITHOUT CONTRAST TECHNIQUE: Multidetector CT imaging of the head and cervical spine was performed following the standard protocol without intravenous contrast. Multiplanar CT image reconstructions of the cervical spine were also generated. RADIATION DOSE REDUCTION: This exam was performed according to the departmental dose-optimization program which includes automated exposure control, adjustment of the mA and/or kV according to patient size and/or use of iterative reconstruction technique. COMPARISON:  Head CT dated 04/11/2022 FINDINGS: CT HEAD FINDINGS Brain: Stable moderately enlarged ventricles and subarachnoid spaces. Stable moderate patchy white matter low density in both cerebral hemispheres. Stable densely calcified meningioma in the left lateral frontoparietal region. This currently measures 1.5 cm in maximum diameter, previously 1.4 cm in corresponding diameter. No intracranial hemorrhage, new mass lesion or CT  evidence of acute infarction. Vascular: No hyperdense vessel or unexpected calcification. Skull: Normal. Negative for fracture or focal lesion. Sinuses/Orbits: Unremarkable. Other: None. CT CERVICAL SPINE FINDINGS Alignment: Normal. Skull base and vertebrae: No acute fracture. No primary bone lesion or focal pathologic process. Soft tissues and spinal canal: No prevertebral fluid or swelling. No visible canal hematoma. Disc levels:  Multilevel degenerative changes. Upper chest: Clear lung apices. Other: Minimal bilateral carotid artery calcification. IMPRESSION: 1. No skull fracture or intracranial hemorrhage. 2. No cervical spine fracture or subluxation. 3. Stable moderate diffuse cerebral and cerebellar atrophy and moderate chronic small vessel white matter ischemic changes in both cerebral hemispheres. 4. Stable densely calcified meningioma in the left lateral frontoparietal region. 5. Multilevel cervical spine degenerative changes. 6. Minimal bilateral carotid artery atheromatous calcification. Electronically Signed   By: Claudie Revering M.D.   On: 08/25/2022 11:43   DG Hip Unilat W or Wo Pelvis 2-3 Views Right  Result Date: 08/25/2022 CLINICAL DATA:  Fall.  Right hip pain. EXAM: DG HIP (WITH OR WITHOUT PELVIS) 2-3V RIGHT COMPARISON:  None Available. FINDINGS: Mildly displaced right femoral neck fracture is seen. No other fractures identified. No evidence of dislocation. IMPRESSION: Mildly displaced right femoral neck fracture. Electronically Signed   By: Marlaine Hind M.D.   On: 08/25/2022 11:20      Labs: BNP (last 3 results) No results for input(s): "BNP" in the last 8760 hours. Basic Metabolic Panel: Recent Labs  Lab  08/27/22 0518 08/28/22 0646 08/29/22 1015 08/30/22 0258 08/31/22 0459  NA 138 136 136 140 139  K 3.8 3.2* 3.5 3.8 3.8  CL 108 106 101 106 106  CO2 '25 26 29 27 27  '$ GLUCOSE 122* 127* 118* 99 89  BUN '12 16 12 12 13  '$ CREATININE 0.64 0.72 0.72 0.67 0.52  CALCIUM 9.0 8.9 8.8* 9.1  8.8*  MG  --   --  2.1 1.9 2.1   Liver Function Tests: Recent Labs  Lab 08/25/22 1041  AST 20  ALT 15  ALKPHOS 64  BILITOT 0.8  PROT 6.3*  ALBUMIN 3.8   No results for input(s): "LIPASE", "AMYLASE" in the last 168 hours. No results for input(s): "AMMONIA" in the last 168 hours. CBC: Recent Labs  Lab 08/27/22 0518 08/28/22 0646 08/29/22 0340 08/30/22 0258 08/30/22 1005 08/31/22 0459  WBC 10.3 9.1 8.0 7.6  --  6.6  HGB 9.7* 8.6* 7.9* 7.7* 7.8* 7.9*  HCT 28.5* 25.3* 23.4* 23.1* 23.9* 24.2*  MCV 84.3 84.9 85.4 85.9  --  86.4  PLT 149* 146* 147* 177  --  207   Cardiac Enzymes: No results for input(s): "CKTOTAL", "CKMB", "CKMBINDEX", "TROPONINI" in the last 168 hours. BNP: Invalid input(s): "POCBNP" CBG: No results for input(s): "GLUCAP" in the last 168 hours. D-Dimer No results for input(s): "DDIMER" in the last 72 hours. Hgb A1c No results for input(s): "HGBA1C" in the last 72 hours. Lipid Profile No results for input(s): "CHOL", "HDL", "LDLCALC", "TRIG", "CHOLHDL", "LDLDIRECT" in the last 72 hours. Thyroid function studies No results for input(s): "TSH", "T4TOTAL", "T3FREE", "THYROIDAB" in the last 72 hours.  Invalid input(s): "FREET3" Anemia work up No results for input(s): "VITAMINB12", "FOLATE", "FERRITIN", "TIBC", "IRON", "RETICCTPCT" in the last 72 hours. Urinalysis    Component Value Date/Time   COLORURINE YELLOW (A) 04/11/2022 1844   APPEARANCEUR CLEAR (A) 04/11/2022 1844   LABSPEC 1.014 04/11/2022 1844   PHURINE 5.0 04/11/2022 1844   GLUCOSEU NEGATIVE 04/11/2022 1844   HGBUR SMALL (A) 04/11/2022 1844   BILIRUBINUR NEGATIVE 04/11/2022 1844   KETONESUR NEGATIVE 04/11/2022 1844   PROTEINUR NEGATIVE 04/11/2022 1844   NITRITE NEGATIVE 04/11/2022 1844   LEUKOCYTESUR NEGATIVE 04/11/2022 1844   Sepsis Labs Recent Labs  Lab 08/28/22 0646 08/29/22 0340 08/30/22 0258 08/31/22 0459  WBC 9.1 8.0 7.6 6.6   Microbiology Recent Results (from the past 240  hour(s))  Resp Panel by RT-PCR (Flu A&B, Covid) Anterior Nasal Swab     Status: None   Collection Time: 08/25/22  2:04 PM   Specimen: Anterior Nasal Swab  Result Value Ref Range Status   SARS Coronavirus 2 by RT PCR NEGATIVE NEGATIVE Final    Comment: (NOTE) SARS-CoV-2 target nucleic acids are NOT DETECTED.  The SARS-CoV-2 RNA is generally detectable in upper respiratory specimens during the acute phase of infection. The lowest concentration of SARS-CoV-2 viral copies this assay can detect is 138 copies/mL. A negative result does not preclude SARS-Cov-2 infection and should not be used as the sole basis for treatment or other patient management decisions. A negative result may occur with  improper specimen collection/handling, submission of specimen other than nasopharyngeal swab, presence of viral mutation(s) within the areas targeted by this assay, and inadequate number of viral copies(<138 copies/mL). A negative result must be combined with clinical observations, patient history, and epidemiological information. The expected result is Negative.  Fact Sheet for Patients:  EntrepreneurPulse.com.au  Fact Sheet for Healthcare Providers:  IncredibleEmployment.be  This test is  no t yet approved or cleared by the [place] and  has been authorized for detection and/or diagnosis of SARS-CoV-2 by FDA under an Emergency Use Authorization (EUA). This EUA will remain  in effect (meaning this test can be used) for the duration of the COVID-19 declaration under Section 564(b)(1) of the Act, 21 U.S.C.section 360bbb-3(b)(1), unless the authorization is terminated  or revoked sooner.       Influenza A by PCR NEGATIVE NEGATIVE Final   Influenza B by PCR NEGATIVE NEGATIVE Final    Comment: (NOTE) The Xpert Xpress SARS-CoV-2/FLU/RSV plus assay is intended as an aid in the diagnosis of influenza from Nasopharyngeal swab specimens and should not be  used as a sole basis for treatment. Nasal washings and aspirates are unacceptable for Xpert Xpress SARS-CoV-2/FLU/RSV testing.  Fact Sheet for Patients: EntrepreneurPulse.com.au  Fact Sheet for Healthcare Providers: IncredibleEmployment.be  This test is not yet approved or cleared by the [place] FDA and has been authorized for detection and/or diagnosis of SARS-CoV-2 by FDA under an Emergency Use Authorization (EUA). This EUA will remain in effect (meaning this test can be used) for the duration of the COVID-19 declaration under Section 564(b)(1) of the Act, 21 U.S.C. section 360bbb-3(b)(1), unless the authorization is terminated or revoked.  Performed at [facility], [address] [place], [place] [address]      Total time spend on discharging this patient, including the last patient exam, discussing the hospital stay, instructions for ongoing care as it relates to all pertinent caregivers, as well as preparing the medical discharge records, prescriptions, and/or referrals as applicable, is 35 minutes.    Enzo Bi, MD  [facility] 08/31/2022, 12:07 PM

## 2022-08-31 NOTE — Progress Notes (Signed)
PT Cancellation Note  Patient Details Name: Mallory Peters MRN: [id number] DOB: [birth year]   Cancelled Treatment:    Reason Eval/Treat Not Completed:  (Chart reviewed and treatment attempted.  Daughter at bedside assisting patient with breakfast.  Requests patient finish eating; also advises patient "not having a good day" due to recurrent diarrhea.  Will continue efforts at later time/date.)   Dajanay Northrup H. Owens Shark, PT, DPT, NCS 08/31/22, 9:30 AM [phone]

## 2022-08-31 NOTE — Progress Notes (Signed)
     Subjective: 5 Days Post-Op Procedure(s) (LRB): ARTHROPLASTY BIPOLAR HIP (HEMIARTHROPLASTY) (Right)   Patient reports pain as mild.  Patient has a history of dementia.  She is has been working with her daughter with exercises in the room.  Patient seems confused.  Patient does not indicate that she is having significant right hip pain.  She has difficulty giving an accurate history.  Patient is in no acute distress.  She is lying in her bed. Daughter states that the plan is to discharge to a facility today.   Objective:   VITALS:   Vitals:   08/31/22 0440 08/31/22 0839  BP: (!) 116/58 (!) 131/59  Pulse: 72 83  Resp: 16 17  Temp: 98.6 F (37 C) 97.6 F (36.4 C)  SpO2: 96% 100%    Neurovascular intact Dorsiflexion/Plantar flexion intact Incision: scant drainage No cellulitis present Compartment soft  LABS Recent Labs    08/29/22 0340 08/30/22 0258 08/30/22 1005 08/31/22 0459  HGB 7.9* 7.7* 7.8* 7.9*  HCT 23.4* 23.1* 23.9* 24.2*  WBC 8.0 7.6  --  6.6  PLT 147* 177  --  207    Recent Labs    08/29/22 1015 08/30/22 0258 08/31/22 0459  NA 136 140 139  K 3.5 3.8 3.8  BUN '12 12 13  '$ CREATININE 0.72 0.67 0.52  GLUCOSE 118* 99 89     Assessment/Plan: 5 Days Post-Op Procedure(s) (LRB): ARTHROPLASTY BIPOLAR HIP (HEMIARTHROPLASTY) (Right)   Up with therapy Discharge to SNF  Patient is doing well from an orthopedic standpoint.  Skilled nursing facility placement today.  We will continue with physical therapy as tolerated.  Recommend she continue taking her baseline Plavix with aspirin 81 mg twice a day for DVT prophylaxis until her return to see me in the office in 2 weeks.  Patient is weightbearing as tolerated on the right lower extremity but will continue to observe posterior hip precautions for several months postop.       Danae Orleans PA-C

## 2022-08-31 NOTE — Plan of Care (Signed)
  Problem: Nutrition: Goal: Adequate nutrition will be maintained Outcome: Progressing   Problem: Coping: Goal: Level of anxiety will decrease Outcome: Progressing   Problem: Elimination: Goal: Will not experience complications related to bowel motility Outcome: Progressing Goal: Will not experience complications related to urinary retention Outcome: Progressing   Problem: Pain Managment: Goal: General experience of comfort will improve Outcome: Progressing   

## 2022-08-31 NOTE — TOC Progression Note (Signed)
Transition of Care [facility]) - Progression Note    Patient Details  Name: Mallory Peters MRN: [id number] Date of Birth: 06/01/1941  Transition of Care [facility]) CM/SW Hall, RN Phone Number: 08/31/2022, 12:37 PM  Clinical Narrative:     Patient going to room 325 at [facility], the daughter is aware EMS is arranged to transport and she is on  the list  Expected Discharge Plan: [place] Barriers to Discharge: Barriers Resolved  Expected Discharge Plan and Services Expected Discharge Plan: [place]         Expected Discharge Date: 08/31/22                                     Social Determinants of Health (SDOH) Interventions    Readmission Risk Interventions     No data to display
